# Patient Record
Sex: Female | Born: 1995 | Race: Black or African American | Hispanic: No | Marital: Single | Smoking: Never smoker
Health system: Southern US, Community
[De-identification: ages and names within clinical notes are randomized; demographics above are authoritative.]

## PROBLEM LIST (undated history)

## (undated) DIAGNOSIS — M329 Systemic lupus erythematosus, unspecified: Secondary | ICD-10-CM

## (undated) DIAGNOSIS — J45909 Unspecified asthma, uncomplicated: Secondary | ICD-10-CM

## (undated) HISTORY — DX: Systemic lupus erythematosus, unspecified: M32.9

## (undated) HISTORY — DX: Unspecified asthma, uncomplicated: J45.909

---

## 2015-12-10 DIAGNOSIS — J4540 Moderate persistent asthma, uncomplicated: Secondary | ICD-10-CM | POA: Insufficient documentation

## 2016-06-29 DIAGNOSIS — M329 Systemic lupus erythematosus, unspecified: Secondary | ICD-10-CM

## 2016-06-29 DIAGNOSIS — IMO0002 Reserved for concepts with insufficient information to code with codable children: Secondary | ICD-10-CM

## 2016-06-29 HISTORY — DX: Reserved for concepts with insufficient information to code with codable children: IMO0002

## 2016-06-29 HISTORY — DX: Systemic lupus erythematosus, unspecified: M32.9

## 2016-10-14 DIAGNOSIS — Z79899 Other long term (current) drug therapy: Secondary | ICD-10-CM | POA: Insufficient documentation

## 2016-10-14 DIAGNOSIS — R768 Other specified abnormal immunological findings in serum: Secondary | ICD-10-CM | POA: Insufficient documentation

## 2016-10-14 DIAGNOSIS — L659 Nonscarring hair loss, unspecified: Secondary | ICD-10-CM | POA: Insufficient documentation

## 2016-10-14 DIAGNOSIS — D72819 Decreased white blood cell count, unspecified: Secondary | ICD-10-CM | POA: Insufficient documentation

## 2016-10-14 DIAGNOSIS — Z8709 Personal history of other diseases of the respiratory system: Secondary | ICD-10-CM | POA: Insufficient documentation

## 2016-10-14 NOTE — Progress Notes (Signed)
Office Visit Note  Patient: Brittany Preston             Date of Birth: Aug 03, 1995           MRN: [id number]             PCP: Verlon Au, MD Referring: Verlon Au, MD Visit Date: 10/16/2016 Occupation: @GUAROCC @    Subjective:  New Patient (Initial Visit) (? LUPUS)   History of Present Illness: Brittany Preston is a 21 y.o. female seen in consultation per request of her PCP. She's accompanied by her parents in the office today. The history was given by patient and her mother. According to them patient moved to [place] in January 2018 for education. While she was there she started experiencing fatigue, hair loss, skin discoloration and rash across her nasal bridge. She also complained of having dry skin and dark spots on her bilateral hands more prominent on her right hand. She started having some weight loss and decrease in appetite. She came back to [facility] in April 2018 to her family and they noticed that she was having a lot of hair loss. She also had dark spots behind her years, neck and on her scalp. She had redness across her face. She was initially seen by her PCP and was given vitamin D for vitamin D deficiency and was referred to dermatologist Dr. Glennon Hamilton in [facility] who did some lab work and her labs were positive for ANA. She was diagnosed with disc and lupus and started her on Plaquenil. Patient did not have a skin biopsy. She took Plaquenil for 3 weeks but discontinued due to side effects which she describes as decreased appetite increased generalized pain mood changes. She was also given topical steroids which she used on her face, hands and her scalp. The rash gradually improved. After stopping the Plaquenil her appetite improved and her generalized pain got better. She also developed some oral ulcers file she was on Plaquenil but that resolved after stopping the medication. She continues to have some discomfort which she describes in the costochondral area in the  upper and lower back. She denies any history of oral ulcers nasal ulcers , Raynaud's phenomenon, photosensitivity, joint swelling.  Activities of Daily Living:  Patient reports morning stiffness for 30 minutes.   Patient Denies nocturnal pain.  Difficulty dressing/grooming: Denies Difficulty climbing stairs: Denies Difficulty getting out of chair: Denies Difficulty using hands for taps, buttons, cutlery, and/or writing: Denies   Review of Systems  Constitutional: Positive for fatigue and weight loss. Negative for night sweats, weight gain and weakness.  HENT: Positive for mouth dryness. Negative for mouth sores, trouble swallowing, trouble swallowing and nose dryness.   Eyes: Negative for pain, redness, visual disturbance and dryness.  Respiratory: Negative for cough, shortness of breath and difficulty breathing.   Cardiovascular: Negative for chest pain, palpitations, hypertension, irregular heartbeat and swelling in legs/feet.  Gastrointestinal: Negative for blood in stool, constipation and diarrhea.  Endocrine: Negative for increased urination.  Genitourinary: Negative for vaginal dryness.  Musculoskeletal: Positive for myalgias, morning stiffness and myalgias. Negative for arthralgias, joint pain, joint swelling, muscle weakness and muscle tenderness.  Skin: Positive for rash and hair loss. Negative for color change, skin tightness, ulcers and sensitivity to sunlight.  Allergic/Immunologic: Negative for susceptible to infections.  Neurological: Negative for dizziness, memory loss and night sweats.  Hematological: Negative for swollen glands.  Psychiatric/Behavioral: Positive for sleep disturbance. Negative for depressed mood. The patient is nervous/anxious.  PMFS History:  Patient Active Problem List   Diagnosis Date Noted  . ANA positive 10/14/2016  . Hair loss 10/14/2016  . Leucopenia 10/14/2016  . History of asthma 10/14/2016  . High risk medication use 10/14/2016      Past Medical History:  Diagnosis Date  . Asthma     History reviewed. No pertinent family history. History reviewed. No pertinent surgical history. Social History   Social History Narrative  . No narrative on file     Objective: Vital Signs: BP 137/80 (BP Location: Left Arm, Patient Position: Sitting, Cuff Size: Normal)   Pulse (!) 110   Resp 12   Ht 5\' 4"  (1.626 m)   Wt 133 lb (60.3 kg)   LMP 09/10/2016   BMI 22.83 kg/m    Physical Exam  Constitutional: She is oriented to person, place, and time. She appears well-developed and well-nourished.  HENT:  Head: Normocephalic and atraumatic.  Eyes: Conjunctivae and EOM are normal.  Neck: Normal range of motion.  Cardiovascular: Normal rate, regular rhythm, normal heart sounds and intact distal pulses.   Pulmonary/Chest: Effort normal and breath sounds normal.  Abdominal: Soft. Bowel sounds are normal.  Lymphadenopathy:    She has no cervical adenopathy.  Neurological: She is alert and oriented to person, place, and time.  Skin: Skin is warm and dry. Capillary refill takes 2 to 3 seconds.  Psychiatric: She has a normal mood and affect. Her behavior is normal.  Nursing note and vitals reviewed.    Musculoskeletal Exam: C-spine and thoracic lumbar spine full range of motion. She has no tenderness over costochondral region. She was able to reach her toes due to tight hamstrings. Shoulder joints elbow joints wrist joint MCPs PIPs DIPs with good range of motion with no synovitis. Hip joints knee joints ankles MTPs PIPs DIPs with good range of motion with no synovitis.  CDAI Exam: No CDAI exam completed.    Investigation: Findings:  08/04/2016 CBC shows WBC 3.2 otherwise normal, TSH normal, Sed Rate 33, ANA positive speckled pattern 1:640, Iron normal, Ferritin elevated 164, Vitamin D low 9.2, CMP normal  08/08/2016 G6PD normal, CMP normal  Dermatologist Dr Lina Sayrearlin Bullard Hollar  has evaluated patient, and has started her on  Plaquenil on 07/2016, has asked us to take this over when patient is evaluated.     Imaging: No results found.  Speciality Comments: No specialty comments available.    Procedures:  No procedures performed Allergies: Patient has no allergy information on record.   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 Detailed counseling was provided. Different treatment options and their side effects were discussed. I think she should do better on Plaquenil which will be a safe medication with the side effects. I would like to give another trial of Plaquenil if she does not tolerate it then we can switch her to something else like methotrexate or Benlysta. I will obtain following labs today.  Rash: Diagnosis skin lupus by dermatologist. I would like to get records from dermatologist .  Use of sunscreen was discussed. Need for regular exercise was also discussed. She's not using any contraception right now and she is not sexually active. I did discuss with her in future if she has to use contraception is has to be low estrogen.  High risk medication use - Plaquenil started by Dermatology  which patient took for 3 weeks and discontinued as she felt it is causing side effects. Patient states that she already had baseline eye exam. They will check on her pneumococcal vaccine status.  Hair loss: She has generalized thinning of hair which is improving.  Other decreased white blood cell (WBC) count: Repeat labs today.  History of asthma  Vitamin D deficiency: Patient has severe vitamin D deficiency she's been taking only 50,000 units once a week. We will check labs today and then it just vitamin D accordingly ideally she should have vitamin D between 50 and 60.  Family history of lupus erythematosus - Paternal great aunt    Orders: Orders  Placed This Encounter  Procedures  . CBC with Differential/Platelet  . COMPLETE METABOLIC PANEL WITH GFR  . Urinalysis, Routine w reflex microscopic  . CK  . Antinuclear Antib (ANA)  . [id number] ENA PANEL  . C3 and C4  . Cardiolipin antibodies, IgG, IgM, IgA  . Lupus anticoagulant panel  . Beta-2 glycoprotein antibodies  . Quantiferon tb gold assay (blood)  . Hepatitis panel, acute  . VITAMIN D 25 Hydroxy (Vit-D Deficiency, Fractures)  . CBC with Differential/Platelet  . COMPLETE METABOLIC PANEL WITH GFR   Meds ordered this encounter  Medications  . hydroxychloroquine (PLAQUENIL) 200 MG tablet    Sig: Take 1 tablet (200 mg total) by mouth 2 (two) times daily. Monday through Friday    Dispense:  40 tablet    Refill:  2    Face-to-face time spent with patient was 60 minutes. 50% of time was spent in counseling and coordination of care.  Follow-Up Instructions: Return for Systemic lupus.   Pollyann Savoy, MD  Note - This record has been created using Animal nutritionist.  Chart creation errors have been sought, but may not always  have been located. Such creation errors do not reflect on  the standard of medical care.

## 2016-10-16 ENCOUNTER — Encounter: Payer: Self-pay | Admitting: Rheumatology

## 2016-10-16 ENCOUNTER — Ambulatory Visit (INDEPENDENT_AMBULATORY_CARE_PROVIDER_SITE_OTHER): Payer: Self-pay | Admitting: Rheumatology

## 2016-10-16 VITALS — BP 137/80 | HR 110 | Resp 12 | Ht 64.0 in | Wt 133.0 lb

## 2016-10-16 DIAGNOSIS — M3219 Other organ or system involvement in systemic lupus erythematosus: Secondary | ICD-10-CM

## 2016-10-16 DIAGNOSIS — Z79899 Other long term (current) drug therapy: Secondary | ICD-10-CM

## 2016-10-16 DIAGNOSIS — E559 Vitamin D deficiency, unspecified: Secondary | ICD-10-CM | POA: Diagnosis not present

## 2016-10-16 DIAGNOSIS — Z1159 Encounter for screening for other viral diseases: Secondary | ICD-10-CM | POA: Diagnosis not present

## 2016-10-16 DIAGNOSIS — Z840 Family history of diseases of the skin and subcutaneous tissue: Secondary | ICD-10-CM

## 2016-10-16 DIAGNOSIS — D72818 Other decreased white blood cell count: Secondary | ICD-10-CM | POA: Diagnosis not present

## 2016-10-16 DIAGNOSIS — Z8709 Personal history of other diseases of the respiratory system: Secondary | ICD-10-CM

## 2016-10-16 DIAGNOSIS — Z1321 Encounter for screening for nutritional disorder: Secondary | ICD-10-CM | POA: Diagnosis not present

## 2016-10-16 DIAGNOSIS — L659 Nonscarring hair loss, unspecified: Secondary | ICD-10-CM

## 2016-10-16 DIAGNOSIS — Z111 Encounter for screening for respiratory tuberculosis: Secondary | ICD-10-CM

## 2016-10-16 DIAGNOSIS — R768 Other specified abnormal immunological findings in serum: Secondary | ICD-10-CM | POA: Diagnosis not present

## 2016-10-16 LAB — CBC WITH DIFFERENTIAL/PLATELET
BASOS ABS: 0 {cells}/uL (ref 0–200)
Basophils Relative: 0 %
EOS PCT: 0 %
Eosinophils Absolute: 0 cells/uL — ABNORMAL LOW (ref 15–500)
HCT: 37.5 % (ref 35.0–45.0)
HEMOGLOBIN: 12.4 g/dL (ref 11.7–15.5)
LYMPHS ABS: 750 {cells}/uL — AB (ref 850–3900)
LYMPHS PCT: 25 %
MCH: 29.1 pg (ref 27.0–33.0)
MCHC: 33.1 g/dL (ref 32.0–36.0)
MCV: 88 fL (ref 80.0–100.0)
MONOS PCT: 8 %
MPV: 10.2 fL (ref 7.5–12.5)
Monocytes Absolute: 240 cells/uL (ref 200–950)
NEUTROS PCT: 67 %
Neutro Abs: 2010 cells/uL (ref 1500–7800)
Platelets: 245 10*3/uL (ref 140–400)
RBC: 4.26 MIL/uL (ref 3.80–5.10)
RDW: 14.3 % (ref 11.0–15.0)
WBC: 3 10*3/uL — AB (ref 3.8–10.8)

## 2016-10-16 MED ORDER — HYDROXYCHLOROQUINE SULFATE 200 MG PO TABS: 200.0000 mg | ORAL_TABLET | Freq: Two times a day (BID) | ORAL | 2 refills | Status: DC

## 2016-10-16 NOTE — Progress Notes (Signed)
Pharmacy Note  Subjective: Patient presents today to the [facility] to see Dr. Corliss Skainseveshwar.  Patient seen by the pharmacist for counseling on hydroxychloroquine.    Objective: CBC, CMP ordered today  Assessment/Plan: Patient was prescribed hydroxychloroquine 200 mg daily.  Patient was counseled on the purpose, proper use, and adverse effects of hydroxychloroquine including nausea/diarrhea, skin rash, headaches, and sun sensitivity.  Discussed importance of annual eye exams while on hydroxychloroquine to monitor to ocular toxicity and discussed importance of frequent laboratory monitoring.  Provided patient with eye exam form for baseline ophthalmologic exam and standing lab instructions.  Provided patient with educational materials on hydroxychloroquine and answered all questions.  Patient consented to hydroxychloroquine.  Will upload consent in the media tab.    Lilla Shookachel Chelesea Weiand, Pharm.D., BCPS Clinical Pharmacist Pager: [phone] Phone: [phone] 10/16/2016 9:47 AM

## 2016-10-16 NOTE — Patient Instructions (Addendum)
We recommend a pneumonia vaccine if you have not already had one.  Please discuss this with your primary care provider.    Start taking hydroxychloroquine 200 mg by mouth daily.  If you tolerate that for a week, you can increase the dose to 200 mg by mouth twice daily Monday through Friday.  Standing Labs We placed an order today for your standing lab work.    Please come back and get your standing labs in 1 month, then in 3 months  We have open lab Monday through Friday from 8:30-11:30 AM and 1:30-4 PM at the office of Dr. Pollyann Savoy.   The office is located at [address], [address], [place], [place] [address] No appointment is necessary.   Labs are drawn by [facility].  You may receive a bill from [facility] for your lab work. If you have any questions regarding directions or hours of operation,  please call [phone].     Hydroxychloroquine tablets What is this medicine? HYDROXYCHLOROQUINE (hye drox ee KLOR oh kwin) is used to treat rheumatoid arthritis and systemic lupus erythematosus. It is also used to treat malaria. This medicine may be used for other purposes; ask your health care provider or pharmacist if you have questions. COMMON BRAND NAME(S): Plaquenil, Quineprox What should I tell my health care provider before I take this medicine? They need to know if you have any of these conditions: -diabetes -eye disease, vision problems -G6PD deficiency -history of blood diseases -history of irregular heartbeat -if you often drink alcohol -kidney disease -liver disease -porphyria -psoriasis -seizures -an unusual or allergic reaction to chloroquine, hydroxychloroquine, other medicines, foods, dyes, or preservatives -pregnant or trying to get pregnant -breast-feeding How should I use this medicine? Take this medicine by mouth with a glass of water. Follow the directions on the prescription label. Avoid taking antacids within 4 hours of taking this medicine. It is best to  separate these medicines by at least 4 hours. Do not cut, crush or chew this medicine. You can take it with or without food. If it upsets your stomach, take it with food. Take your medicine at regular intervals. Do not take your medicine more often than directed. Take all of your medicine as directed even if you think you are better. Do not skip doses or stop your medicine early. Talk to your pediatrician regarding the use of this medicine in children. While this drug may be prescribed for selected conditions, precautions do apply. Overdosage: If you think you have taken too much of this medicine contact a poison control center or emergency room at once. NOTE: This medicine is only for you. Do not share this medicine with others. What if I miss a dose? If you miss a dose, take it as soon as you can. If it is almost time for your next dose, take only that dose. Do not take double or extra doses. What may interact with this medicine? Do not take this medicine with any of the following medications: -cisapride -dofetilide -dronedarone -live virus vaccines -penicillamine -pimozide -thioridazine -ziprasidone This medicine may also interact with the following medications: -ampicillin -antacids -cimetidine -cyclosporine -digoxin -medicines for diabetes, like insulin, glipizide, glyburide -medicines for seizures like carbamazepine, phenobarbital, phenytoin -mefloquine -methotrexate -other medicines that prolong the QT interval (cause an abnormal heart rhythm) -praziquantel This list may not describe all possible interactions. Give your health care provider a list of all the medicines, herbs, non-prescription drugs, or dietary supplements you use. Also tell them if you smoke,  drink alcohol, or use illegal drugs. Some items may interact with your medicine. What should I watch for while using this medicine? Tell your doctor or healthcare professional if your symptoms do not start to get better or  if they get worse. Avoid taking antacids within 4 hours of taking this medicine. It is best to separate these medicines by at least 4 hours. Tell your doctor or health care professional right away if you have any change in your eyesight. Your vision and blood may be tested before and during use of this medicine. This medicine can make you more sensitive to the sun. Keep out of the sun. If you cannot avoid being in the sun, wear protective clothing and use sunscreen. Do not use sun lamps or tanning beds/booths. What side effects may I notice from receiving this medicine? Side effects that you should report to your doctor or health care professional as soon as possible: -allergic reactions like skin rash, itching or hives, swelling of the face, lips, or tongue -changes in vision -decreased hearing or ringing of the ears -redness, blistering, peeling or loosening of the skin, including inside the mouth -seizures -sensitivity to light -signs and symptoms of a dangerous change in heartbeat or heart rhythm like chest pain; dizziness; fast or irregular heartbeat; palpitations; feeling faint or lightheaded, falls; breathing problems -signs and symptoms of liver injury like dark yellow or brown urine; general ill feeling or flu-like symptoms; light-colored stools; loss of appetite; nausea; right upper belly pain; unusually weak or tired; yellowing of the eyes or skin -signs and symptoms of low blood sugar such as feeling anxious; confusion; dizziness; increased hunger; unusually weak or tired; sweating; shakiness; cold; irritable; headache; blurred vision; fast heartbeat; loss of consciousness -uncontrollable head, mouth, neck, arm, or leg movements Side effects that usually do not require medical attention (report to your doctor or health care professional if they continue or are bothersome): -anxious -diarrhea -dizziness -hair loss -headache -irritable -loss of appetite -nausea, vomiting -stomach  pain This list may not describe all possible side effects. Call your doctor for medical advice about side effects. You may report side effects to FDA at 1-800-FDA-1088. Where should I keep my medicine? Keep out of the reach of children. In children, this medicine can cause overdose with small doses. Store at room temperature between 15 and 30 degrees C (59 and 86 degrees F). Protect from moisture and light. Throw away any unused medicine after the expiration date. NOTE: This sheet is a summary. It may not cover all possible information. If you have questions about this medicine, talk to your doctor, pharmacist, or health care provider.  2018 Elsevier/Gold Standard (2015-10-31 14:16:15)   [facility] A Division of [facility] [address], [place], [place] [address] Telephone: [phone]  Fax: [phone]  Our mutual patient _____________________ (DOB: _____ / _____ / _____) is in need of a BASELINE hydroxychloroquine (Plaquenil) toxicity eye exam or FOLLOW UP Plaquenil eye exam.   Please fill out the form below and fax to our office when the Plaquenil eye exam is completed.   Thank you for your assistance in this matter.   Sincerely,   Pollyann Savoy, MD         Date of Plaquenil Toxicity Eye Exam: _____ / _____ / _____  Plaquenil Toxicity Eye Exam Was:   Normal   ABNORMAL  Plaquenil Should Be:     Continued  DISCONTINUED  Date of Follow-up Plaquenil Eye Exam:  6 mo.          12 mo.     Other: ___ / ___ / ___    Practice Name:       Name of Eye Doctor:      ___________________________   ___________________________     Practice Phone Number:     Signature of Eye Doctor:      ___________________________              ___________________________ Systemic Lupus Erythematosus, Adult Systemic lupus erythematosus is a long-term (chronic) disease that can affect many parts of the body. It can damage the skin, joints, blood  vessels, brain, kidneys, lungs, heart, and other internal organs. It causes pain, irritation, and inflammation. Systemic lupus erythematosus is an autoimmune disease. With this type of disease, the body's defense system (immune system) mistakenly attacks normal tissues instead of attacking germs or abnormal growths. What are the causes? The cause of this condition is not known. What increases the risk? This condition is more likely to develop in:  Females.  People of Asian descent.  People of African-American descent.  People who have a family history of the condition.  What are the signs or symptoms? General symptoms include:  Joint pain and swelling (common).  Fever.  Fatigue.  Unusual weight loss or weight gain.  Skin rashes, especially over the nose and cheeks (butterfly rash) and after sun exposure.  Sores inside the mouth or nose.  Other symptoms depend on which parts of the body are affected. They can include:  Shortness of breath.  Chest pain.  Frequent urination.  Blood in the urine.  Seizures.  Mental changes.  Hair loss.  Swollen and tender lymph nodes.  Swelling of the hands or feet.  Symptoms can come and go. A period of time when symptoms get worse or come back is called a flare. A period of time with no symptoms is called a remission. How is this diagnosed? This condition is diagnosed based on symptoms, a medical history, and a physical exam. You may also have tests, including:  Blood tests.  Urine tests.  A chest X-ray.  A skin or kidney biopsy. For this test, a sample of tissue is taken from the skin or kidney and studied under a microscope.  You may be referred to an autoimmune disease specialist (rheumatologist). How is this treated? There is no cure for this condition, but treatment can keep the disease in remission, help to control symptoms, and prevent damage to the heart, lungs, kidneys, and other organs. Treatment may involve  taking a combination of medicines over time. Follow these instructions at home: Medicines  Take medicines only as directed by your health care provider.  Do not take any medicines that contain estrogen without first checking with your health care provider. Estrogen can trigger flares and may increase your risk for blood clots. Lifestyle  Eat a heart-healthy diet.  Stay active as directed by your health care provider.  Do not smoke. If you need help quitting, ask your health care provider.  Protect your skin from the sun by applying sunblock and wearing protective hats and clothing.  Learn as much as you can about your condition and have a good support system in place. Support may come from family, friends, or a lupus support group. General instructions  Keep all follow-up visits as directed by your health care provider. This is important.  Work closely with all of your health care providers to manage your condition.  Let your health care provider know right away if you become pregnant or if you plan to become pregnant. Pregnancy in women with this condition is considered high risk. Contact a health care provider if:  You have a fever.  Your symptoms flare.  You develop new symptoms.  You develop swollen feet or hands.  You develop puffiness around your eyes.  Your medicines are not working.  You have bloody, foamy, or coffee-colored urine.  There are changes in your urination. For example, you urinate more often at night.  You think that you may be depressed or have anxiety. Get help right away if:  You have chest pain.  You have trouble breathing.  You have a seizure.  You suddenly get a very bad headache.  You suddenly develop facial or body weakness.  You cannot speak.  You cannot understand speech. This information is not intended to replace advice given to you by your health care provider. Make sure you discuss any questions you have with your health  care provider. Document Released: 03/07/2002 Document Revised: 11/11/2015 Document Reviewed: 02/22/2014 Elsevier Interactive Patient Education  [employer]2018 Elsevier Inc.

## 2016-10-17 ENCOUNTER — Telehealth (INDEPENDENT_AMBULATORY_CARE_PROVIDER_SITE_OTHER): Payer: Self-pay | Admitting: Rheumatology

## 2016-10-17 LAB — URINALYSIS, ROUTINE W REFLEX MICROSCOPIC
Bilirubin Urine: NEGATIVE
GLUCOSE, UA: NEGATIVE
Hgb urine dipstick: NEGATIVE
Ketones, ur: NEGATIVE
Nitrite: NEGATIVE
PROTEIN: NEGATIVE
SPECIFIC GRAVITY, URINE: 1.018 (ref 1.001–1.035)
pH: 7 (ref 5.0–8.0)

## 2016-10-17 LAB — CP5000020 ENA PANEL
DS DNA AB: 7 [IU]/mL — AB
ENA SM AB SER-ACNC: POSITIVE — AB
Ribonucleic Protein(ENA) Antibody, IgG: 7.3 — ABNORMAL HIGH
SSA (Ro) (ENA) Antibody, IgG: >8 — ABNORMAL HIGH
SSB (LA) (ENA) ANTIBODY, IGG: NEGATIVE
Scleroderma (Scl-70) (ENA) Antibody, IgG: <1

## 2016-10-17 LAB — ANTI-NUCLEAR AB-TITER (ANA TITER)

## 2016-10-17 LAB — COMPLETE METABOLIC PANEL WITH GFR
ALBUMIN: 4.2 g/dL (ref 3.6–5.1)
ALK PHOS: 33 U/L (ref 33–115)
ALT: 17 U/L (ref 6–29)
AST: 20 U/L (ref 10–30)
BUN: 8 mg/dL (ref 7–25)
CHLORIDE: 104 mmol/L (ref 98–110)
CO2: 16 mmol/L — ABNORMAL LOW (ref 20–31)
Calcium: 9.5 mg/dL (ref 8.6–10.2)
Creat: 0.78 mg/dL (ref 0.50–1.10)
GFR, Est African American: >89 mL/min (ref >=60)
GLUCOSE: 75 mg/dL (ref 65–99)
POTASSIUM: 4.2 mmol/L (ref 3.5–5.3)
SODIUM: 136 mmol/L (ref 135–146)
Total Bilirubin: 0.4 mg/dL (ref 0.2–1.2)
Total Protein: 9.1 g/dL — ABNORMAL HIGH (ref 6.1–8.1)

## 2016-10-17 LAB — CARDIOLIPIN ANTIBODIES, IGG, IGM, IGA
Anticardiolipin IgG: <14 [GPL'U]
Anticardiolipin IgM: <12 [MPL'U]

## 2016-10-17 LAB — URINALYSIS, MICROSCOPIC ONLY
Bacteria, UA: NONE SEEN [HPF]
Casts: NONE SEEN [LPF]
Crystals: NONE SEEN [HPF]
YEAST: NONE SEEN [HPF]

## 2016-10-17 LAB — VITAMIN D 25 HYDROXY (VIT D DEFICIENCY, FRACTURES): Vit D, 25-Hydroxy: 59 ng/mL (ref 30–100)

## 2016-10-17 LAB — ANA: Anti Nuclear Antibody(ANA): POSITIVE — AB

## 2016-10-17 LAB — C3 AND C4
C3 Complement: 67 mg/dL — ABNORMAL LOW (ref 83–193)
C4 COMPLEMENT: 15 mg/dL (ref 15–57)

## 2016-10-17 LAB — CK: CK TOTAL: 48 U/L (ref 29–143)

## 2016-10-17 NOTE — Telephone Encounter (Signed)
10/16/2016 [facility]

## 2016-10-18 LAB — HEPATITIS PANEL, ACUTE
HCV Ab: NEGATIVE
HEP B S AG: NEGATIVE
Hep A IgM: NONREACTIVE
Hep B C IgM: NONREACTIVE

## 2016-10-18 LAB — QUANTIFERON TB GOLD ASSAY (BLOOD)
INTERFERON GAMMA RELEASE ASSAY: NEGATIVE
Mitogen-Nil: 5.95 IU/mL
QUANTIFERON NIL VALUE: 0.12 [IU]/mL

## 2016-10-19 LAB — LUPUS ANTICOAGULANT PANEL

## 2016-10-19 LAB — RFX DRVVT SCR W/RFLX CONF 1:1 MIX: dRVVT Screen: 31 s (ref <=45)

## 2016-10-19 LAB — RFX PTT-LA W/RFX TO HEX PHASE CONF: PTT-LA Screen: 40 s (ref <=40)

## 2016-10-20 LAB — BETA-2 GLYCOPROTEIN ANTIBODIES
Beta-2 Glyco I IgG: <9 SGU (ref <=20)
Beta-2-Glycoprotein I IgA: <9 SAU (ref <=20)
Beta-2-Glycoprotein I IgM: 25 SMU — ABNORMAL HIGH (ref <=20)

## 2016-10-20 NOTE — Progress Notes (Signed)
Labs are c/w Lupus. If she tolerates PLQ 200 mg po qd then will increase PLQ to 200 mg po BID Monday - Friday after next labs labs

## 2016-10-28 ENCOUNTER — Telehealth: Payer: Self-pay | Admitting: *Deleted

## 2016-10-28 NOTE — Telephone Encounter (Signed)
Patient advised of lab results and verbalized understanding.  

## 2016-10-28 NOTE — Telephone Encounter (Signed)
-----   Message from Pollyann SavoyShaili Deveshwar, MD sent at 10/20/2016  8:14 PM EDT ----- Labs are c/w Lupus. If she tolerates PLQ 200 mg po qd then will increase PLQ to 200 mg po BID Monday - Friday after next labs labs

## 2016-10-28 NOTE — Telephone Encounter (Signed)
Attempted to contact the patient and left message for patient to call the office.  

## 2016-11-06 DIAGNOSIS — E559 Vitamin D deficiency, unspecified: Secondary | ICD-10-CM | POA: Insufficient documentation

## 2016-11-06 NOTE — Progress Notes (Signed)
Office Visit Note  Patient: Brittany Preston             Date of Birth: 09-27-1995           MRN: [id number]             PCP: Verlon Au, MD Referring: No ref. provider found Visit Date: 11/13/2016 Occupation: @GUAROCC @    Subjective:  Muscle pain.   History of Present Illness: Brittany Preston is a 21 y.o. female history of systemic lupus or dermatosis. She started Plaquenil about a month ago. She is feeling somewhat better since the last visit. She has noticed improvement in her fatigue, hair loss and the rash. She still have mild Raynaud's symptoms. She denies any joint pain or joint swelling. She does have some muscular pain in her thoracic and lumbar region.  Activities of Daily Living:  Patient reports morning stiffness for 10  minutes.   Patient Denies nocturnal pain.  Difficulty dressing/grooming: Denies Difficulty climbing stairs: Denies Difficulty getting out of chair: Denies Difficulty using hands for taps, buttons, cutlery, and/or writing: Denies   Review of Systems  Constitutional: Positive for fatigue.  HENT: Positive for mouth dryness. Negative for mouth sores.   Eyes: Negative for dryness.  Respiratory: Positive for cough. Negative for shortness of breath and difficulty breathing.   Cardiovascular: Negative.  Negative for palpitations, hypertension and irregular heartbeat.  Gastrointestinal: Positive for constipation. Negative for blood in stool and diarrhea.  Endocrine: Negative.   Genitourinary: Negative.  Negative for nocturia.  Musculoskeletal: Positive for arthralgias, joint pain and morning stiffness. Negative for joint swelling, myalgias, muscle weakness and myalgias.  Skin: Negative.  Negative for color change, rash, hair loss, ulcers and sensitivity to sunlight.  Neurological: Negative.  Negative for headaches.  Hematological: Negative.  Negative for swollen glands.  Psychiatric/Behavioral: Positive for sleep disturbance. Negative for depressed  mood. The patient is nervous/anxious.     PMFS History:  Patient Active Problem List   Diagnosis Date Noted  . Systemic lupus erythematosus (HCC) 11/10/2016  . Vitamin D deficiency 11/06/2016  . Hair loss 10/14/2016  . Leukopenia 10/14/2016  . History of asthma 10/14/2016  . High risk medication use 10/14/2016    Past Medical History:  Diagnosis Date  . Asthma     History reviewed. No pertinent family history. History reviewed. No pertinent surgical history. Social History   Social History Narrative  . No narrative on file     Objective: Vital Signs: BP 125/82   Pulse (!) 109   Resp 12   Ht 5\' 2"  (1.575 m)   Wt 128 lb (58.1 kg)   BMI 23.41 kg/m    Physical Exam  Constitutional: She is oriented to person, place, and time. She appears well-developed and well-nourished.  HENT:  Head: Normocephalic and atraumatic.  Eyes: Conjunctivae and EOM are normal.  Neck: Normal range of motion.  Cardiovascular: Normal rate, regular rhythm, normal heart sounds and intact distal pulses.   Pulmonary/Chest: Effort normal and breath sounds normal.  Abdominal: Soft. Bowel sounds are normal.  Lymphadenopathy:    She has no cervical adenopathy.  Neurological: She is alert and oriented to person, place, and time.  Skin: Skin is warm and dry. Capillary refill takes less than 2 seconds.  Psychiatric: She has a normal mood and affect. Her behavior is normal.  Nursing note and vitals reviewed.    Musculoskeletal Exam: C-spine and thoracic lumbar spine good range of motion. Shoulder joints elbow joints wrist  joint MCPs PIPs DIPs are good range of motion with no synovitis. Hip joints knee joints ankles MTPs PIPs DIPs are good range of motion with no synovitis.  CDAI Exam: No CDAI exam completed.    Investigation: No additional findings. Eye exam May 2018  CBC Latest Ref Rng & Units 10/16/2016  WBC 3.8 - 10.8 K/uL 3.0(L)  Hemoglobin 11.7 - 15.5 g/dL 16.1  Hematocrit 09.6 - 45.0 % 37.5   Platelets 140 - 400 K/uL 245   CMP Latest Ref Rng & Units 10/16/2016  Glucose 65 - 99 mg/dL 75  BUN 7 - 25 mg/dL 8  Creatinine 0.45 - 4.09 mg/dL 8.11  Sodium 914 - 782 mmol/L 136  Potassium 3.5 - 5.3 mmol/L 4.2  Chloride 98 - 110 mmol/L 104  CO2 20 - 31 mmol/L 16(L)  Calcium 8.6 - 10.2 mg/dL 9.5  Total Protein 6.1 - 8.1 g/dL 9.1(H)  Total Bilirubin 0.2 - 1.2 mg/dL 0.4  Alkaline Phos 33 - 115 U/L 33  AST 10 - 30 U/L 20  ALT 6 - 29 U/L 17  UA neg,CK48, ANA 1:1280NS, C3 67(low) C4 normal,DS DNA +,RNP+,Sm+,Ro+,aCLneg,b2GP1 IgM 25, LA neg, Hep panel neg, TB gold neg, Vit d 59 Imaging: No results found.  Speciality Comments: No specialty comments available.    Procedures:  No procedures performed Allergies: Patient has no known allergies.   Assessment / Plan:     Visit Diagnoses: Other systemic lupus erythematosus with other organ involvement (HCC) - History of fatigue, weight loss, hair loss, malar rash, Raynauds,skin lupus, neutropenia, elevated sedimentation rate, positive ANA,ds DNA, Sm, Ro, low C3.Patient is clinically doing much better on Plaquenil. Her fatigue and hair loss is improved. She does not have any malar rash today. She states she has some hyperpigmented lesions which are getting better.. Detailed discussion regarding her lab review. She has multiple antibodies which are positive significance of each antibody was discussed. She has positive Ro antibody association of Ro antibody with arrhythmias and fetal abnormalities was also discussed. She's been advised to get a baseline EKG with her PCP.  High risk medication use - Plaquenil 200mg  po bid M-F - Plan: CBC with Differential/Platelet, COMPLETE METABOLIC PANEL WITH GFR today and then every 3 months. I will check her dsDNA complements next visit.  Other decreased white blood cell (WBC) count: We will continue to monitor.  Hair loss: Improving. I did discuss option of using  topical Rogaine.  Rash : Improving. Need for  using sunscreen on a regular basis was emphasized.  Vitamin D deficiency: Her vitamin D should be between 50-60 which isn't desirable range right now. I've advised her to take vitamin D 2000 units every day after she finished the course of vitamin D.  History of asthma  High risk medication use - Plaquenil started by Dermatology  - Plan: CBC with Differential/Platelet, COMPLETE METABOLIC PANEL WITH GFR   We also discussed the use of contraception if needed should be low estrogen. Orders: No orders of the defined types were placed in this encounter.  Meds ordered this encounter  Medications  . hydroxychloroquine (PLAQUENIL) 200 MG tablet    Sig: Take 1 tablet (200 mg total) by mouth 2 (two) times daily. Monday through Friday    Dispense:  120 tablet    Refill:  0    Face-to-face time spent with patient was 45 minutes. Greater than 50% of time was spent in counseling and coordination of care.  Follow-Up Instructions: Return in about 3 months (  around 02/13/2017) for Systemic lupus.   Pollyann SavoyShaili Prophet Renwick, MD  Note - This record has been created using Animal [employer]agon software.  Chart creation errors have been sought, but may not always  have been located. Such creation errors do not reflect on  the standard of medical care.

## 2016-11-10 DIAGNOSIS — M329 Systemic lupus erythematosus, unspecified: Secondary | ICD-10-CM | POA: Insufficient documentation

## 2016-11-13 ENCOUNTER — Encounter (INDEPENDENT_AMBULATORY_CARE_PROVIDER_SITE_OTHER): Payer: Self-pay

## 2016-11-13 ENCOUNTER — Other Ambulatory Visit: Payer: Self-pay | Admitting: Radiology

## 2016-11-13 ENCOUNTER — Ambulatory Visit (INDEPENDENT_AMBULATORY_CARE_PROVIDER_SITE_OTHER): Payer: 59 | Admitting: Rheumatology

## 2016-11-13 ENCOUNTER — Encounter: Payer: Self-pay | Admitting: Rheumatology

## 2016-11-13 VITALS — BP 125/82 | HR 109 | Resp 12 | Ht 62.0 in | Wt 128.0 lb

## 2016-11-13 DIAGNOSIS — L659 Nonscarring hair loss, unspecified: Secondary | ICD-10-CM

## 2016-11-13 DIAGNOSIS — D72818 Other decreased white blood cell count: Secondary | ICD-10-CM | POA: Diagnosis not present

## 2016-11-13 DIAGNOSIS — Z8709 Personal history of other diseases of the respiratory system: Secondary | ICD-10-CM | POA: Diagnosis not present

## 2016-11-13 DIAGNOSIS — R21 Rash and other nonspecific skin eruption: Secondary | ICD-10-CM

## 2016-11-13 DIAGNOSIS — E559 Vitamin D deficiency, unspecified: Secondary | ICD-10-CM

## 2016-11-13 DIAGNOSIS — Z79899 Other long term (current) drug therapy: Secondary | ICD-10-CM | POA: Diagnosis not present

## 2016-11-13 DIAGNOSIS — M3219 Other organ or system involvement in systemic lupus erythematosus: Secondary | ICD-10-CM | POA: Diagnosis not present

## 2016-11-13 LAB — CBC WITH DIFFERENTIAL/PLATELET
Basophils Absolute: 0 cells/uL (ref 0–200)
Basophils Relative: 0 %
EOS PCT: 0 %
Eosinophils Absolute: 0 cells/uL — ABNORMAL LOW (ref 15–500)
HCT: 35.8 % (ref 35.0–45.0)
HEMOGLOBIN: 11.9 g/dL (ref 11.7–15.5)
Lymphocytes Relative: 31 %
Lymphs Abs: 961 cells/uL (ref 850–3900)
MCH: 29.1 pg (ref 27.0–33.0)
MCHC: 33.2 g/dL (ref 32.0–36.0)
MCV: 87.5 fL (ref 80.0–100.0)
MONO ABS: 341 {cells}/uL (ref 200–950)
MONOS PCT: 11 %
MPV: 10.1 fL (ref 7.5–12.5)
NEUTROS PCT: 58 %
Neutro Abs: 1798 cells/uL (ref 1500–7800)
PLATELETS: 259 10*3/uL (ref 140–400)
RBC: 4.09 MIL/uL (ref 3.80–5.10)
RDW: 13.8 % (ref 11.0–15.0)
WBC: 3.1 10*3/uL — AB (ref 3.8–10.8)

## 2016-11-13 MED ORDER — HYDROXYCHLOROQUINE SULFATE 200 MG PO TABS: 200.0000 mg | ORAL_TABLET | Freq: Two times a day (BID) | ORAL | 0 refills | Status: DC

## 2016-11-13 NOTE — Progress Notes (Signed)
Rheumatology Medication Review by a Pharmacist Does the patient feel that his/her medications are working for him/her?  Patient started hydroxychloroquine one month ago.  She reports some improvement in fatigue.   Has the patient been experiencing any side effects to the medications prescribed?  No Does the patient have any problems obtaining medications?  No , however, patient reports future refills will need to come from mail order pharmacy.   Issues to address at subsequent visits: None   Pharmacist comments:  Brittany Preston is a pleasant 21 yo F who presents for follow up of systemic lupus erythematosus.  She is currently taking hydroxychloroquine 200 mg BID Monday through Friday.  Patient started hydroxychloroquine approximately one month ago.  She is due for standing labs today then in 3 months.  Patient confirms she had her baseline hydroxychloroquine eye exam in May.  She will have results sent to our office.  Patient denies any questions or concerns regarding her medications at this time.   Brittany Preston, Pharm.D., BCPS, CPP Clinical Pharmacist Pager: [phone] Phone: [phone] 11/13/2016 9:50 AM

## 2016-11-13 NOTE — Patient Instructions (Addendum)
Standing Labs We placed an order today for your standing lab work.    Please come back and get your standing labs in November then every 3 months.  We have open lab Monday through Friday from 8:30-11:30 AM and 1:30-4 PM at the office of Dr. Pollyann SavoyShaili Deveshwar.   The office is located at [address], [address], [place], [place] [address] No appointment is necessary.   Labs are drawn by [facility].  You may receive a bill from [facility] for your lab work. If you have any questions regarding directions or hours of operation,  please call [phone].    Advised baseline EKG with your PCP.

## 2016-11-14 LAB — COMPLETE METABOLIC PANEL WITH GFR
ALBUMIN: 4.3 g/dL (ref 3.6–5.1)
ALT: 18 U/L (ref 6–29)
AST: 19 U/L (ref 10–30)
Alkaline Phosphatase: 34 U/L (ref 33–115)
BUN: 9 mg/dL (ref 7–25)
CHLORIDE: 106 mmol/L (ref 98–110)
CO2: 21 mmol/L (ref 20–32)
Calcium: 9.5 mg/dL (ref 8.6–10.2)
Creat: 0.77 mg/dL (ref 0.50–1.10)
GFR, Est African American: >89 mL/min (ref >=60)
GFR, Est Non African American: >89 mL/min (ref >=60)
GLUCOSE: 86 mg/dL (ref 65–99)
POTASSIUM: 4.3 mmol/L (ref 3.5–5.3)
SODIUM: 137 mmol/L (ref 135–146)
Total Bilirubin: 0.3 mg/dL (ref 0.2–1.2)
Total Protein: 8.7 g/dL — ABNORMAL HIGH (ref 6.1–8.1)

## 2016-11-14 NOTE — Progress Notes (Signed)
Labs are stable.

## 2017-01-01 ENCOUNTER — Other Ambulatory Visit: Payer: Self-pay | Admitting: Rheumatology

## 2017-01-01 NOTE — Telephone Encounter (Signed)
Last Visit: 11/13/16  Next Visit: 03/30/17 Labs: 11/13/16 stable PLQ Eye Exam: no baseline plq eye exam on file.  Attempted to contact patient and unable to leave a message. Mailbox is full.

## 2017-01-05 NOTE — Telephone Encounter (Signed)
Okay to refill this last time without PLQ eye exam per Dr. Corliss Skains

## 2017-02-13 ENCOUNTER — Other Ambulatory Visit: Payer: Self-pay

## 2017-02-13 ENCOUNTER — Encounter (INDEPENDENT_AMBULATORY_CARE_PROVIDER_SITE_OTHER): Payer: Self-pay

## 2017-02-13 DIAGNOSIS — Z79899 Other long term (current) drug therapy: Secondary | ICD-10-CM

## 2017-02-13 LAB — CBC WITH DIFFERENTIAL/PLATELET
BASOS PCT: 0 %
Basophils Absolute: 0 cells/uL (ref 0–200)
EOS ABS: 0 {cells}/uL — AB (ref 15–500)
Eosinophils Relative: 0 %
HCT: 35.1 % (ref 35.0–45.0)
HEMOGLOBIN: 11.7 g/dL (ref 11.7–15.5)
Lymphs Abs: 788 cells/uL — ABNORMAL LOW (ref 850–3900)
MCH: 29 pg (ref 27.0–33.0)
MCHC: 33.3 g/dL (ref 32.0–36.0)
MCV: 86.9 fL (ref 80.0–100.0)
MPV: 10.5 fL (ref 7.5–12.5)
Monocytes Relative: 13.5 %
Neutro Abs: 1115 cells/uL — ABNORMAL LOW (ref 1500–7800)
Neutrophils Relative %: 50.7 %
Platelets: 232 10*3/uL (ref 140–400)
RBC: 4.04 10*6/uL (ref 3.80–5.10)
RDW: 12.8 % (ref 11.0–15.0)
Total Lymphocyte: 35.8 %
WBC: 2.2 10*3/uL — ABNORMAL LOW (ref 3.8–10.8)
WBCMIX: 297 {cells}/uL (ref 200–950)

## 2017-02-13 LAB — COMPLETE METABOLIC PANEL WITH GFR
AG Ratio: 1.1 (calc) (ref 1.0–2.5)
ALT: 17 U/L (ref 6–29)
AST: 19 U/L (ref 10–30)
Albumin: 4.3 g/dL (ref 3.6–5.1)
Alkaline phosphatase (APISO): 30 U/L — ABNORMAL LOW (ref 33–115)
BILIRUBIN TOTAL: 0.5 mg/dL (ref 0.2–1.2)
BUN: 10 mg/dL (ref 7–25)
CHLORIDE: 104 mmol/L (ref 98–110)
CO2: 27 mmol/L (ref 20–32)
Calcium: 9.4 mg/dL (ref 8.6–10.2)
Creat: 0.78 mg/dL (ref 0.50–1.10)
GFR, Est African American: 126 mL/min/{1.73_m2} (ref >=60)
GFR, Est Non African American: 109 mL/min/{1.73_m2} (ref >=60)
GLUCOSE: 72 mg/dL (ref 65–99)
Globulin: 3.9 g/dL (calc) — ABNORMAL HIGH (ref 1.9–3.7)
Potassium: 4.1 mmol/L (ref 3.5–5.3)
Sodium: 138 mmol/L (ref 135–146)
TOTAL PROTEIN: 8.2 g/dL — AB (ref 6.1–8.1)

## 2017-03-17 NOTE — Progress Notes (Signed)
Office Visit Note  Patient: Brittany Preston             Date of Birth: [birth year]           MRN: [id number]             PCP: Verlon AuBoyd, Tammy Lamonica, MD Referring: Verlon AuBoyd, Tammy Lamonica, MD Visit Date: 03/30/2017 Occupation: @GUAROCC @    Subjective:  Recent lupus flare  History of Present Illness: Brittany Preston is a 21 y.o. female with history of systemic lupus erythematosus.  Patient states she continues to take PLQ 200 mg BID M-F.  She states her eye exam was on Aug 15, 2016.  Patient states starting the week of December 10 th she began to have a lupus flare.  She states she had body aches, fatigue, decreased appetite, weight loss, eye lid puffiness, weakness, dark spots on eyelids/neck/underneath breasts, and blisters on her bilateral hands.  She denies the blisters being painful. She has not seen a dermatologist recently. She has been using an ointment on them.  She denies weight loss.  She denies joint swelling.   She has been having pain in her lower back, pain between shoulder blades, and bilateral hands.  She states she is starting to feel better overall since her flare started after final exams.  Patient reports she has been having palpitations and racing of her heart since this flare began.   Activities of Daily Living:  Patient reports morning stiffness for 10 minutes.   Patient Reports nocturnal pain.  Difficulty dressing/grooming: Denies Difficulty climbing stairs: Reports Difficulty getting out of chair: Reports Difficulty using hands for taps, buttons, cutlery, and/or writing: Reports   Review of Systems  Constitutional: Positive for fatigue and weakness.  HENT: Negative for mouth sores, mouth dryness and nose dryness.   Eyes: Negative for redness and dryness.  Respiratory: Negative for cough, hemoptysis, shortness of breath and difficulty breathing.   Cardiovascular: Positive for palpitations. Negative for chest pain, hypertension, irregular heartbeat and swelling in  legs/feet.  Gastrointestinal: Negative for blood in stool, constipation and diarrhea.  Endocrine: Negative for increased urination.  Genitourinary: Negative for painful urination.  Musculoskeletal: Positive for arthralgias, joint pain, morning stiffness and muscle tenderness. Negative for joint swelling, myalgias, muscle weakness and myalgias.  Skin: Positive for rash. Negative for color change, pallor, hair loss, nodules/bumps, redness, skin tightness, ulcers and sensitivity to sunlight.  Neurological: Negative for dizziness, numbness and headaches.  Hematological: Negative for swollen glands.  Psychiatric/Behavioral: Positive for depressed mood and sleep disturbance. The patient is not nervous/anxious.     PMFS History:  Patient Active Problem List   Diagnosis Date Noted  . Systemic lupus erythematosus (HCC) 11/10/2016  . Vitamin D deficiency 11/06/2016  . Hair loss 10/14/2016  . Leukopenia 10/14/2016  . History of asthma 10/14/2016  . High risk medication use 10/14/2016    Past Medical History:  Diagnosis Date  . Asthma     History reviewed. No pertinent family history. History reviewed. No pertinent surgical history. Social History   Social History Narrative  . Not on file     Objective: Vital Signs: BP 106/74 (BP Location: Left Arm, Patient Position: Sitting, Cuff Size: Normal)   Pulse (!) 117   Resp 16   Ht 5\' 2"  (1.575 m)   Wt 127 lb (57.6 kg)   BMI 23.23 kg/m    Physical Exam  Constitutional: She is oriented to person, place, and time. She appears well-developed and well-nourished.  HENT:  Head: Normocephalic and atraumatic.  One oral ulcer noted  Eyes: Conjunctivae and EOM are normal.  Periorbital edema   Neck: Normal range of motion.  Cardiovascular: Normal rate, regular rhythm, normal heart sounds and intact distal pulses.  Pulmonary/Chest: Effort normal and breath sounds normal.  Abdominal: Soft. Bowel sounds are normal.  Musculoskeletal: She exhibits  no edema.  Lymphadenopathy:    She has cervical adenopathy.  Neurological: She is alert and oriented to person, place, and time.  Skin: Skin is warm and dry. Capillary refill takes less than 2 seconds.  Dark discoloration on eyelids, neck, and underneath bilateral breasts Ulcerations on bilateral hands with peeling and dark discoloration around nailbeds   Psychiatric: She has a normal mood and affect. Her behavior is normal.  Nursing note and vitals reviewed.    Musculoskeletal Exam: C-spine, thoracic, and lumbar spine good ROM.  Shoulder joints, elbow joints, and wrist joints good ROM.  MCPs, PIPs, and DIPs good ROM with no synovitis.  Complete fist formation.  Edema of bilateral hands  Hip joints, knee joints, and ankle joints good ROM with no synovitis.  No crepitus, warmth or effusion of knees.  MTPs, PIPs, and DIPs good ROM no synovitis.      CDAI Exam: No CDAI exam completed.    Investigation: No additional findings. CBC Latest Ref Rng & Units 02/13/2017 11/13/2016 10/16/2016  WBC 3.8 - 10.8 Thousand/uL 2.2(L) 3.1(L) 3.0(L)  Hemoglobin 11.7 - 15.5 g/dL 16.111.7 09.611.9 04.512.4  Hematocrit 35.0 - 45.0 % 35.1 35.8 37.5  Platelets 140 - 400 Thousand/uL 232 259 245   CMP Latest Ref Rng & Units 02/13/2017 11/13/2016 10/16/2016  Glucose 65 - 99 mg/dL 72 86 75  BUN 7 - 25 mg/dL 10 9 8   Creatinine 0.50 - 1.10 mg/dL 4.090.78 8.110.77 9.140.78  Sodium 135 - 146 mmol/L 138 137 136  Potassium 3.5 - 5.3 mmol/L 4.1 4.3 4.2  Chloride 98 - 110 mmol/L 104 106 104  CO2 20 - 32 mmol/L 27 21 16(L)  Calcium 8.6 - 10.2 mg/dL 9.4 9.5 9.5  Total Protein 6.1 - 8.1 g/dL 8.2(H) 8.7(H) 9.1(H)  Total Bilirubin 0.2 - 1.2 mg/dL 0.5 0.3 0.4  Alkaline Phos 33 - 115 U/L - 34 33  AST 10 - 30 U/L 19 19 20   ALT 6 - 29 U/L 17 18 17     Imaging: No results found.  Speciality Comments: No specialty comments available.    Procedures:  No procedures performed Allergies: Patient has no known allergies.   Assessment / Plan:       Visit Diagnoses: Other systemic lupus erythematosus with other organ involvement (HCC) - History of fatigue, weight loss, hair loss, malar rash, Raynauds,skin lupus, neutropenia, elevated sedimentation rate, positive ANA,ds DNA, Sm, Ro, low C3 -Patient reports a recent lupus flare that began March 09, 2017.  She experienced fatigue, weakness, decreased appetite, Raynaud's with ulcerations on bilateral hands, arthralgia, and palpitations.  Routine lupus labs were drawn today.  She will continue on Plaquenil 200 mg po BID M-F.  She was given a refill of PLQ today. TPMT was also drawn.  We will initiate patient on Imuran PO 50 mg daily for 2 weeks and we will check labs in 2 weeks.  After two weeks we will increase her Imuran dose to 100 mg po every day and repeat labs in 2 weeks.  She was advised to start taking Aspirin 81 mg daily.  She was also given a prescription for a prednisone 60 mg  taper.  She was counseled on side effects of Imuran and Prednisone.  All questions were addressed.  The Prednisone tapering schedule was also explained to the patient.  A handout with information about Prednisone and Imuran was provided to the patient.  She will follow up in 1 week.   Plan: Anti-DNA antibody, double-stranded, Beta-2 glycoprotein antibodies, VITAMIN D 25 Hydroxy (Vit-D Deficiency, Fractures), C3 and C4, Urinalysis, Routine w reflex microscopic, COMPLETE METABOLIC PANEL WITH GFR, CBC with Differential/Platelet, Pan-ANCA. Patient will start Imuran if her TPM T results are normal.  High risk medication use - Plaquenil 200mg  po bid M-F. Eye exam May 2018.  They are going to have ophthalmologist fax the results.  She was given a handout for her next PLQ eye exam. TPMT was drawn today.  She was given a prescription for Imuran 50 mg po everyday.  She will return in 2 weeks to check labs.- Plan: COMPLETE METABOLIC PANEL WITH GFR, CBC with Differential/Platelet, Thiopurine methyltransferase(tpmt)rbc  Other decreased  white blood cell (WBC) count - A CBC was drawn today.  We will continue to monitor.    Hair loss - Denies any recent hair loss.    Rash - Improving. Need for using sunscreen on a regular basis was emphasized.    History of vitamin D deficiency: Vitamin D level was drawn today.  She was advised to continue taking Vitamin D supplements daily.    Palpitations: She reports that with this recent flare she has noticed an increase in heart rate and palpitations.  In the office, she had a HR of 101.  Patient denies ever having a baseline EKG or seeing a cardiologist in the past.  She will see Dr. Jacinto Halim today.    Raynaud's disease without gangrene: She has ulcerations on all digits.  She has perionychial dark discoloration.  She was instructed to wear gloves and keep her core body temperature warm. She was instructed to keep her hands clean. She was also advised to take Aspirin 81 mg daily.    History of asthma: No shortness of breath or wheezing.   Orders: Orders Placed This Encounter  Procedures  . Anti-DNA antibody, double-stranded  . Beta-2 glycoprotein antibodies  . VITAMIN D 25 Hydroxy (Vit-D Deficiency, Fractures)  . C3 and C4  . Urinalysis, Routine w reflex microscopic  . COMPLETE METABOLIC PANEL WITH GFR  . CBC with Differential/Platelet  . Thiopurine methyltransferase(tpmt)rbc  . Pan-ANCA   Meds ordered this encounter  Medications  . azaTHIOprine (IMURAN) 50 MG tablet    Sig: Take 1 tab po daily x 2 wks, then labs, then 2 tabs po daily    Dispense:  60 tablet    Refill:  2  . predniSONE (DELTASONE) 10 MG tablet    Sig: 6 tabs po qd x 1 wk, 5 tabs po qd x 1 wk, 4 tabs po qd x 1 wk, 3.5 tabs po qd x 1 wk, 3 tabs po qd x 1 wk, 2.5 tabs po qd x 1 wk, 2 tabs po qd x 2 wks, 1.5 tabs po qd x 2 wks, then 1 tab po daily    Dispense:  250 tablet    Refill:  0    Face-to-face time spent with patient was 30 minutes. Greater than 50% of time was spent in counseling and coordination of  care.  Follow-Up Instructions: Return in about 1 week (around 04/06/2017) for Systemic lupus erythematosus. Pollyann Savoy, MD  Note - This record has been created using Dragon  software.  Chart creation errors have been sought, but may not always  have been located. Such creation errors do not reflect on  the standard of medical care.

## 2017-03-29 ENCOUNTER — Other Ambulatory Visit: Payer: Self-pay | Admitting: Rheumatology

## 2017-03-30 ENCOUNTER — Ambulatory Visit: Payer: 59 | Admitting: Rheumatology

## 2017-03-30 ENCOUNTER — Encounter: Payer: Self-pay | Admitting: Rheumatology

## 2017-03-30 VITALS — BP 106/74 | HR 117 | Resp 16 | Ht 62.0 in | Wt 127.0 lb

## 2017-03-30 DIAGNOSIS — Z79899 Other long term (current) drug therapy: Secondary | ICD-10-CM | POA: Diagnosis not present

## 2017-03-30 DIAGNOSIS — Z8709 Personal history of other diseases of the respiratory system: Secondary | ICD-10-CM | POA: Diagnosis not present

## 2017-03-30 DIAGNOSIS — Z8639 Personal history of other endocrine, nutritional and metabolic disease: Secondary | ICD-10-CM

## 2017-03-30 DIAGNOSIS — R002 Palpitations: Secondary | ICD-10-CM | POA: Diagnosis not present

## 2017-03-30 DIAGNOSIS — M3219 Other organ or system involvement in systemic lupus erythematosus: Secondary | ICD-10-CM

## 2017-03-30 DIAGNOSIS — I7300 Raynaud's syndrome without gangrene: Secondary | ICD-10-CM | POA: Diagnosis not present

## 2017-03-30 DIAGNOSIS — R21 Rash and other nonspecific skin eruption: Secondary | ICD-10-CM | POA: Diagnosis not present

## 2017-03-30 DIAGNOSIS — D72818 Other decreased white blood cell count: Secondary | ICD-10-CM

## 2017-03-30 DIAGNOSIS — L659 Nonscarring hair loss, unspecified: Secondary | ICD-10-CM

## 2017-03-30 MED ORDER — PREDNISONE 10 MG PO TABS: ORAL_TABLET | ORAL | 0 refills | Status: DC

## 2017-03-30 MED ORDER — AZATHIOPRINE 50 MG PO TABS: ORAL_TABLET | ORAL | 2 refills | Status: DC

## 2017-03-30 NOTE — Patient Instructions (Signed)
Prednisone tablets What is this medicine? PREDNISONE (PRED ni sone) is a corticosteroid. It is commonly used to treat inflammation of the skin, joints, lungs, and other organs. Common conditions treated include asthma, allergies, and arthritis. It is also used for other conditions, such as blood disorders and diseases of the adrenal glands. This medicine may be used for other purposes; ask your health care provider or pharmacist if you have questions. COMMON BRAND NAME(S): Deltasone, Predone, Sterapred, Sterapred DS What should I tell my health care provider before I take this medicine? They need to know if you have any of these conditions: -Cushing's syndrome -diabetes -glaucoma -heart disease -high blood pressure -infection (especially a virus infection such as chickenpox, cold sores, or herpes) -kidney disease -liver disease -mental illness -myasthenia gravis -osteoporosis -seizures -stomach or intestine problems -thyroid disease -an unusual or allergic reaction to lactose, prednisone, other medicines, foods, dyes, or preservatives -pregnant or trying to get pregnant -breast-feeding How should I use this medicine? Take this medicine by mouth with a glass of water. Follow the directions on the prescription label. Take this medicine with food. If you are taking this medicine once a day, take it in the morning. Do not take more medicine than you are told to take. Do not suddenly stop taking your medicine because you may develop a severe reaction. Your doctor will tell you how much medicine to take. If your doctor wants you to stop the medicine, the dose may be slowly lowered over time to avoid any side effects. Talk to your pediatrician regarding the use of this medicine in children. Special care may be needed. Overdosage: If you think you have taken too much of this medicine contact a poison control center or emergency room at once. NOTE: This medicine is only for you. Do not share  this medicine with others. What if I miss a dose? If you miss a dose, take it as soon as you can. If it is almost time for your next dose, talk to your doctor or health care professional. You may need to miss a dose or take an extra dose. Do not take double or extra doses without advice. What may interact with this medicine? Do not take this medicine with any of the following medications: -metyrapone -mifepristone This medicine may also interact with the following medications: -aminoglutethimide -amphotericin B -aspirin and aspirin-like medicines -barbiturates -certain medicines for diabetes, like glipizide or glyburide -cholestyramine -cholinesterase inhibitors -cyclosporine -digoxin -diuretics -ephedrine -female hormones, like estrogens and birth control pills -isoniazid -ketoconazole -NSAIDS, medicines for pain and inflammation, like ibuprofen or naproxen -phenytoin -rifampin -toxoids -vaccines -warfarin This list may not describe all possible interactions. Give your health care provider a list of all the medicines, herbs, non-prescription drugs, or dietary supplements you use. Also tell them if you smoke, drink alcohol, or use illegal drugs. Some items may interact with your medicine. What should I watch for while using this medicine? Visit your doctor or health care professional for regular checks on your progress. If you are taking this medicine over a prolonged period, carry an identification card with your name and address, the type and dose of your medicine, and your doctor's name and address. This medicine may increase your risk of getting an infection. Tell your doctor or health care professional if you are around anyone with measles or chickenpox, or if you develop sores or blisters that do not heal properly. If you are going to have surgery, tell your doctor or health care professional  that you have taken this medicine within the last twelve months. Ask your doctor or  health care professional about your diet. You may need to lower the amount of salt you eat. This medicine may affect blood sugar levels. If you have diabetes, check with your doctor or health care professional before you change your diet or the dose of your diabetic medicine. What side effects may I notice from receiving this medicine? Side effects that you should report to your doctor or health care professional as soon as possible: -allergic reactions like skin rash, itching or hives, swelling of the face, lips, or tongue -changes in emotions or moods -changes in vision -depressed mood -eye pain -fever or chills, cough, sore throat, pain or difficulty passing urine -increased thirst -swelling of ankles, feet Side effects that usually do not require medical attention (report to your doctor or health care professional if they continue or are bothersome): -confusion, excitement, restlessness -headache -nausea, vomiting -skin problems, acne, thin and shiny skin -trouble sleeping -weight gain This list may not describe all possible side effects. Call your doctor for medical advice about side effects. You may report side effects to FDA at 1-800-FDA-1088. Where should I keep my medicine? Keep out of the reach of children. Store at room temperature between 15 and 30 degrees C (59 and 86 degrees F). Protect from light. Keep container tightly closed. Throw away any unused medicine after the expiration date. NOTE: This sheet is a summary. It may not cover all possible information. If you have questions about this medicine, talk to your doctor, pharmacist, or health care provider.  2018 Elsevier/Gold Standard (2010-10-31 10:57:14) Azathioprine tablets What is this medicine? AZATHIOPRINE (ay za THYE oh preen) suppresses the immune system. It is used to prevent organ rejection after a transplant. It is also used to treat rheumatoid arthritis. This medicine may be used for other purposes; ask your  health care provider or pharmacist if you have questions. COMMON BRAND NAME(S): Azasan, Imuran What should I tell my health care provider before I take this medicine? They need to know if you have any of these conditions: -infection -kidney disease -liver disease -an unusual or allergic reaction to azathioprine, other medicines, lactose, foods, dyes, or preservatives -pregnant or trying to get pregnant -breast feeding How should I use this medicine? Take this medicine by mouth with a full glass of water. Follow the directions on the prescription label. Take your medicine at regular intervals. Do not take your medicine more often than directed. Continue to take your medicine even if you feel better. Do not stop taking except on your doctor's advice. Talk to your pediatrician regarding the use of this medicine in children. Special care may be needed. Overdosage: If you think you have taken too much of this medicine contact a poison control center or emergency room at once. NOTE: This medicine is only for you. Do not share this medicine with others. What if I miss a dose? If you miss a dose, take it as soon as you can. If it is almost time for your next dose, take only that dose. Do not take double or extra doses. What may interact with this medicine? Do not take this medicine with any of the following medications: -febuxostat -mercaptopurine This medicine may also interact with the following medications: -allopurinol -aminosalicylates like sulfasalazine, mesalamine, balsalazide, and olsalazine -leflunomide -medicines called ACE inhibitors like benazepril, captopril, enalapril, fosinopril, quinapril, lisinopril, ramipril, and trandolapril -mycophenolate -sulfamethoxazole; trimethoprim -vaccines -warfarin This list may not  describe all possible interactions. Give your health care provider a list of all the medicines, herbs, non-prescription drugs, or dietary supplements you use. Also tell  them if you smoke, drink alcohol, or use illegal drugs. Some items may interact with your medicine. What should I watch for while using this medicine? Visit your doctor or health care professional for regular checks on your progress. You will need frequent blood checks during the first few months you are receiving the medicine. If you get a cold or other infection while receiving this medicine, call your doctor or health care professional. Do not treat yourself. The medicine may increase your risk of getting an infection. Women should inform their doctor if they wish to become pregnant or think they might be pregnant. There is a potential for serious side effects to an unborn child. Talk to your health care professional or pharmacist for more information. Men may have a reduced sperm count while they are taking this medicine. Talk to your health care professional for more information. This medicine may increase your risk of getting certain kinds of cancer. Talk to your doctor about healthy lifestyle choices, important screenings, and your risk. What side effects may I notice from receiving this medicine? Side effects that you should report to your doctor or health care professional as soon as possible: -allergic reactions like skin rash, itching or hives, swelling of the face, lips, or tongue -changes in vision -confusion -fever, chills, or any other sign of infection -loss of balance or coordination -severe stomach pain -unusual bleeding, bruising -unusually weak or tired -vomiting -yellowing of the eyes or skin Side effects that usually do not require medical attention (report to your doctor or health care professional if they continue or are bothersome): -hair loss -nausea This list may not describe all possible side effects. Call your doctor for medical advice about side effects. You may report side effects to FDA at 1-800-FDA-1088. Where should I keep my medicine? Keep out of the reach  of children. Store at room temperature between 15 and 25 degrees C (59 and 77 degrees F). Protect from light. Throw away any unused medicine after the expiration date. NOTE: This sheet is a summary. It may not cover all possible information. If you have questions about this medicine, talk to your doctor, pharmacist, or health care provider.  2018 Elsevier/Gold Standard (2013-07-12 12:00:31)

## 2017-03-30 NOTE — Progress Notes (Signed)
Office Visit Note  Patient: Brittany Preston             Date of Birth: 1995-09-28           MRN: [id number]             PCP: Verlon AuBoyd, Tammy Lamonica, MD Referring: Verlon AuBoyd, Tammy Lamonica, MD Visit Date: 04/06/2017 Occupation: @GUAROCC @    Subjective:  Right knee stiffness and swelling   History of Present Illness: Brittany Preston is a 21 y.o. female with history of systemic lupus erythematosus.  Patient states she is feeling better since her visit last week.  She is on Prednisone 60 mg daily and is tapering to 50 mg this week.  She states she has been tolerating the Prednisone well.  She states her fatigue and body aches have improved. She has had more energy and the Prednisone has caused an increased appetite.  She denies any weight loss.  She denies a malar rash.  She denies any recent fevers or swollen lymph nodes.  She denies any oral or nasal ulcers. She states her blisters on her hands have been healing.  She received a cream from her dermatologist, which has been helping.  She is unsure what the cream is.  She saw Dr. Jacinto HalimGanji on 03/30/17 and his EKG was normal.  She is going back on Wednesday to have an Echo performed.  She states her palpitations have resolved.  She reports right knee and right ankle stiffness and swelling.  She states she only has pain with kneeling.  She continues to take Plaquenil 200 mg BID M-F.  Activities of Daily Living:  Patient reports morning stiffness for 5 minutes.   Patient Denies nocturnal pain.  Difficulty dressing/grooming: Denies Difficulty climbing stairs: Denies Difficulty getting out of chair: Denies Difficulty using hands for taps, buttons, cutlery, and/or writing: Denies   Review of Systems  Constitutional: Positive for fatigue. Negative for weakness.  HENT: Negative for mouth sores, mouth dryness and nose dryness.   Eyes: Negative for redness and dryness.  Respiratory: Negative for cough, hemoptysis, shortness of breath and difficulty breathing.     Cardiovascular: Negative for chest pain, palpitations, hypertension, irregular heartbeat and swelling in legs/feet.  Gastrointestinal: Negative for blood in stool, constipation and diarrhea.  Endocrine: Negative for increased urination.  Genitourinary: Negative for painful urination.  Musculoskeletal: Positive for arthralgias, joint pain and morning stiffness. Negative for joint swelling, myalgias, muscle weakness, muscle tenderness and myalgias.  Skin: Positive for color change (Raynaud's) and ulcers. Negative for rash, hair loss, nodules/bumps, redness, skin tightness and sensitivity to sunlight.  Allergic/Immunologic: Negative for susceptible to infections.  Neurological: Negative for dizziness, numbness and headaches.  Hematological: Negative for swollen glands.  Psychiatric/Behavioral: Positive for sleep disturbance. Negative for depressed mood. The patient is not nervous/anxious.     PMFS History:  Patient Active Problem List   Diagnosis Date Noted  . Systemic lupus erythematosus (HCC) 11/10/2016  . Vitamin D deficiency 11/06/2016  . Hair loss 10/14/2016  . Leukopenia 10/14/2016  . History of asthma 10/14/2016  . High risk medication use 10/14/2016    Past Medical History:  Diagnosis Date  . Asthma     History reviewed. No pertinent family history. History reviewed. No pertinent surgical history. Social History   Social History Narrative  . Not on file     Objective: Vital Signs: BP 114/76 (BP Location: Left Arm, Patient Position: Sitting, Cuff Size: Normal)   Pulse 74   Resp 15  Ht 5\' 2"  (1.575 m)   Wt 133 lb (60.3 kg)   BMI 24.33 kg/m    Physical Exam  Constitutional: She is oriented to person, place, and time. She appears well-developed and well-nourished.  HENT:  Head: Normocephalic and atraumatic.  Oral ulcer healed.  Eyes: Conjunctivae and EOM are normal.  Neck: Normal range of motion.  Cardiovascular: Normal rate, regular rhythm, normal heart sounds  and intact distal pulses.  Pulmonary/Chest: Effort normal and breath sounds normal.  Abdominal: Soft. Bowel sounds are normal.  Musculoskeletal: She exhibits no edema.  Lymphadenopathy:    She has no cervical adenopathy.  Neurological: She is alert and oriented to person, place, and time.  Skin: Skin is warm and dry. Capillary refill takes less than 2 seconds.  Healing ulcerations on bilateral hands.   Psychiatric: She has a normal mood and affect. Her behavior is normal.  Nursing note and vitals reviewed.    Musculoskeletal Exam: C-spine, thoracic, and lumbar spine good ROM.  Shoulder joints. Elbow joints, wrist joints good ROM.  MTPs, PIPs, and DIPs good ROM with no synovitis.  Healing ulcerations on hands.  Hip joints, knee joints, ankle joints, MTPs, PIPs, and DIPs good ROM with no synovitis.  Bilateral  knee effusion, right more than left. Bilateral knees are slightly erythematous but no warmth.     CDAI Exam: No CDAI exam completed.    Investigation: No additional findings.TB Gold: 10/16/2016 Negative  CBC Latest Ref Rng & Units 03/30/2017 02/13/2017 11/13/2016  WBC 3.8 - 10.8 Thousand/uL 2.4(L) 2.2(L) 3.1(L)  Hemoglobin 11.7 - 15.5 g/dL 11.1(L) 11.7 11.9  Hematocrit 35.0 - 45.0 % 33.3(L) 35.1 35.8  Platelets 140 - 400 Thousand/uL 206 232 259   CMP Latest Ref Rng & Units 03/30/2017 02/13/2017 11/13/2016  Glucose 65 - 99 mg/dL 75 72 86  BUN 7 - 25 mg/dL 17 10 9   Creatinine 0.50 - 1.10 mg/dL 4.09 8.11 9.14  Sodium 135 - 146 mmol/L 137 138 137  Potassium 3.5 - 5.3 mmol/L 4.3 4.1 4.3  Chloride 98 - 110 mmol/L 106 104 106  CO2 20 - 32 mmol/L 26 27 21   Calcium 8.6 - 10.2 mg/dL 8.2(L) 9.4 9.5  Total Protein 6.1 - 8.1 g/dL 7.4 7.8(G) 9.5(A)  Total Bilirubin 0.2 - 1.2 mg/dL 0.3 0.5 0.3  Alkaline Phos 33 - 115 U/L - - 34  AST 10 - 30 U/L 33(H) 19 19  ALT 6 - 29 U/L 26 17 18     Imaging: No results found.  Speciality Comments: No specialty comments available.    Procedures:    No procedures performed Allergies: Patient has no known allergies.    Assessment / Plan:     Visit Diagnoses: Other systemic lupus erythematosus with other organ involvement (HCC) - History of fatigue, weight loss, hair loss, malar rash, Raynauds,skin lupus, neutropenia, elevated sedimentation rate, positive ANA,ds DNA, Sm, Ro, low C3 - She continues to have fatigue but her overall fatigue and body aches have improved.  Her hand ulcerations are healing well.  She has no new rashes, fevers, or swollen lymph nodes.  Her periorbital edema has improved significantly.  She has no oral or nasal ulcers.  She has bilateral knee effusions, worse in the right knee.  She declined a knee aspiration today. She continues to take Plaquenil 200 mg BID po M-F. She will continue on Prednisone 50 mg by mouth everyday this week and taper to 40 mg po everyday next week.  She will follow up  in 2 weeks.  The tapering plan was discussed with her and she understands. Her UA was repeated today. BMP and CBC were drawn today. Different treatment options and their side effects were discussed. 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 Indications side effects contraindications of methotrexate were discussed. She is willing to proceed with methotrexate. As she was neutropenic I will start her on a lower dose and his increase it gradually. She will be started on Methotrexate 4 tablets weekly. We will repeat CBC and CMP in 2 weeks and increase MTX to 6 tablets weekly and then recheck labs in 2 weeks.  She will also take folic acid 2 mg daily.  She will continue to take Aspirin 81 mg po daily. A prescription for MTX and folic acid was sent to the pharmacy.  We discussed the imprortance of good nutrition, rest, and good sleep hygiene.  Plan: Urinalysis, Routine w reflex microscopic  Effusion bilateral knee joints: Patient declined aspiration.  High risk medication use - Plaquenil 200mg  po bid M-F.PLQ eye exam normal  08/15/16. Prednisone 60 mg taper. She is unable to be initiated on Imuran due to TMPT deficiency.  She will be started on Methotrexate 4 tablets po once weekly.  Folic acid 2 mg daily. BMP and CBC were drawn today.  Her CBC and CMP will be rechecked in 2 weeks after starting MTX.  Discussed the importance of getting a CXR and following up with Dr. Leda QuailSuzanne Miller to initiate an oral contraceptive medication.  A chest x-ray was ordered.  A referral was placed to Dr. Leda QuailSuzanne Miller.    Encounter for counseling regarding contraception: We discussed the importance of oral contraception options.  A referral was made to Dr. Leda QuailSuzanne Miller.  She will need to be started on a low estrogen birth control.  She states she is currently not sexually active at this time.    Drug Counseling TB Gold: Negative 10/16/16 Hepatitis panel: Negative 10/16/16  Chest-xray:  Ordered   Contraception: Not sexually active.  A referral was made to Dr. Leda QuailSuzanne Miller to discuss oral contraceptive options.    Alcohol use: Discussed.  She will avoid alcohol.    Patient was counseled on the purpose, proper use, and adverse effects of methotrexate including nausea, infection, and signs and symptoms of pneumonitis.  Reviewed instructions with patient to take methotrexate weekly along with folic acid daily.  Discussed the importance of frequent monitoring of kidney and liver function and blood counts, and provided patient with standing lab instructions.  Counseled patient to avoid NSAIDs and alcohol while on methotrexate.  Provided patient with educational materials on methotrexate and answered all questions.  Advised patient to get annual influenza vaccine and to get a pneumococcal vaccine if patient has not already had one.  Patient voiced understanding.  Patient consented to methotrexate use.  Will upload into chart.    Raynaud's disease without gangrene - Healing ucerations on all digits.  She has perionychial dark discoloration.   She  received a cream from her dermatologist, which has helped in the healing process.  She will continue on Aspirin 81 mg po daily.   Other decreased white blood cell (WBC) count - labs on 03/30/17 revealed WBC of 2.4, which is likely due to her lupus.  We will continue to monitor.  CBC was drawn today and will be rechecked in 2 weeks after starting MTX.     Rash - Improving. Need for using sunscreen on a  regular basis was emphasized.  She recently saw her dermatologist.     Hair loss -  Denies any recent hair loss.    History of vitamin D deficiency - She was advised to continue taking Vitamin D supplements daily.  Her vitamin D was WNL at 75.  We will continue to monitor.  Ideally, her vitamin D level should be between 50-60.      Palpitations - She reports that her appointment with Dr. Jacinto Halim on 03/30/2017 went well.  Her EKG was normal.  She is having an Echo performed on Wednesday.    History of asthma: No shortness of breath or wheezing.      Orders: Orders Placed This Encounter  Procedures  . DG Chest 2 View  . Urinalysis, Routine w reflex microscopic  . BASIC METABOLIC PANEL WITH GFR  . CBC with Differential/Platelet  . CK  . Ambulatory referral to Gynecology   Meds ordered this encounter  Medications  . methotrexate (RHEUMATREX) 2.5 MG tablet    Sig: Caution:Chemotherapy. Protect from light. Take 4 tablets once weekly for 2 weeks then increase to 6 tablets once weekly for two weeks.    Dispense:  20 tablet    Refill:  0  . folic acid (FOLVITE) 1 MG tablet    Sig: Take 2 tablets (2 mg total) by mouth daily.    Dispense:  180 tablet    Refill:  3    Face-to-face time spent with patient was 30 minutes. Greater than 50% of time was spent in counseling and coordination of care.  Follow-Up Instructions: Return in about 2 weeks (around 04/20/2017) for Systemic lupus erythematosus.   Pollyann Savoy, MD  Note - This record has been created using Animal nutritionist.  Chart  creation errors have been sought, but may not always  have been located. Such creation errors do not reflect on  the standard of medical care.

## 2017-03-30 NOTE — Telephone Encounter (Signed)
Last Visit: 11/13/16  Next Visit: 03/30/17 Labs: 11/13/16 stable PLQ Eye Exam: Patient states she had that done in May 2018. WNL  Will have a copy sent from eye doctor  Okay to refill per Dr. Corliss Skainseveshwar

## 2017-04-01 ENCOUNTER — Telehealth: Payer: Self-pay | Admitting: Rheumatology

## 2017-04-01 LAB — URINALYSIS, ROUTINE W REFLEX MICROSCOPIC
Bacteria, UA: NONE SEEN /HPF
Bilirubin Urine: NEGATIVE
Glucose, UA: NEGATIVE
Hyaline Cast: NONE SEEN /LPF
Ketones, ur: NEGATIVE
Nitrite: NEGATIVE
Specific Gravity, Urine: 1.02 (ref 1.001–1.03)
pH: 6.5 (ref 5.0–8.0)

## 2017-04-01 LAB — BETA-2 GLYCOPROTEIN ANTIBODIES: Beta-2 Glyco 1 IgM: <9 SMU (ref <=20)

## 2017-04-01 LAB — COMPLETE METABOLIC PANEL WITH GFR
AG Ratio: 0.7 (calc) — ABNORMAL LOW (ref 1.0–2.5)
ALBUMIN MSPROF: 3.1 g/dL — AB (ref 3.6–5.1)
ALT: 26 U/L (ref 6–29)
AST: 33 U/L — ABNORMAL HIGH (ref 10–30)
Alkaline phosphatase (APISO): 34 U/L (ref 33–115)
BUN: 17 mg/dL (ref 7–25)
CALCIUM: 8.2 mg/dL — AB (ref 8.6–10.2)
CO2: 26 mmol/L (ref 20–32)
Chloride: 106 mmol/L (ref 98–110)
Creat: 0.7 mg/dL (ref 0.50–1.10)
GFR, EST AFRICAN AMERICAN: 144 mL/min/{1.73_m2} (ref >=60)
GFR, EST NON AFRICAN AMERICAN: 124 mL/min/{1.73_m2} (ref >=60)
Globulin: 4.3 g/dL (calc) — ABNORMAL HIGH (ref 1.9–3.7)
Glucose, Bld: 75 mg/dL (ref 65–99)
Potassium: 4.3 mmol/L (ref 3.5–5.3)
Sodium: 137 mmol/L (ref 135–146)
TOTAL PROTEIN: 7.4 g/dL (ref 6.1–8.1)
Total Bilirubin: 0.3 mg/dL (ref 0.2–1.2)

## 2017-04-01 LAB — CBC WITH DIFFERENTIAL/PLATELET
BASOS ABS: 0 {cells}/uL (ref 0–200)
Basophils Relative: 0 %
EOS PCT: 0 %
Eosinophils Absolute: 0 cells/uL — ABNORMAL LOW (ref 15–500)
HEMATOCRIT: 33.3 % — AB (ref 35.0–45.0)
HEMOGLOBIN: 11.1 g/dL — AB (ref 11.7–15.5)
LYMPHS ABS: 773 {cells}/uL — AB (ref 850–3900)
MCH: 28.9 pg (ref 27.0–33.0)
MCHC: 33.3 g/dL (ref 32.0–36.0)
MCV: 86.7 fL (ref 80.0–100.0)
MPV: 10.7 fL (ref 7.5–12.5)
Monocytes Relative: 9.7 %
NEUTROS PCT: 58.1 %
Neutro Abs: 1394 cells/uL — ABNORMAL LOW (ref 1500–7800)
Platelets: 206 10*3/uL (ref 140–400)
RBC: 3.84 10*6/uL (ref 3.80–5.10)
RDW: 12.1 % (ref 11.0–15.0)
Total Lymphocyte: 32.2 %
WBC: 2.4 10*3/uL — ABNORMAL LOW (ref 3.8–10.8)
WBCMIX: 233 {cells}/uL (ref 200–950)

## 2017-04-01 LAB — C3 AND C4
C3 Complement: 40 mg/dL — ABNORMAL LOW (ref 83–193)
C4 Complement: 3 mg/dL — ABNORMAL LOW (ref 15–57)

## 2017-04-01 LAB — ANTI-DNA ANTIBODY, DOUBLE-STRANDED: ds DNA Ab: 6 [IU]/mL — ABNORMAL HIGH

## 2017-04-01 LAB — VITAMIN D 25 HYDROXY (VIT D DEFICIENCY, FRACTURES): Vit D, 25-Hydroxy: 38 ng/mL (ref 30–100)

## 2017-04-01 NOTE — Telephone Encounter (Signed)
I spoke with patient today. She is clinically doing better. She reports that joint pain and fatigue has improved. She's been taking prednisone 60 mg a day. She still has rash on her eyelids  and over her hands. She stated that she is planning to go see a dermatologist. I plan to start her on Imuran as soon as her TPM T results come back.

## 2017-04-01 NOTE — Progress Notes (Signed)
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  She has proteinuria (2+) we will repeat her UA on Monday at her follow up visit.  We will also check a BMP to monitor her blood glucose level since she is on high dose Prednisone.  A protein creatinine ratio will also be checked.   Other labs are pending

## 2017-04-04 LAB — PAN-ANCA

## 2017-04-04 LAB — THIOPURINE METHYLTRANSFERASE (TPMT), RBC: Thiopurine Methyltransferase, RBC: 9 nmol/hr/mL RBC — ABNORMAL LOW

## 2017-04-06 ENCOUNTER — Ambulatory Visit: Payer: 59 | Admitting: Rheumatology

## 2017-04-06 ENCOUNTER — Encounter: Payer: Self-pay | Admitting: Rheumatology

## 2017-04-06 VITALS — BP 114/76 | HR 74 | Resp 15 | Ht 62.0 in | Wt 133.0 lb

## 2017-04-06 DIAGNOSIS — Z79899 Other long term (current) drug therapy: Secondary | ICD-10-CM | POA: Diagnosis not present

## 2017-04-06 DIAGNOSIS — Z3009 Encounter for other general counseling and advice on contraception: Secondary | ICD-10-CM

## 2017-04-06 DIAGNOSIS — R002 Palpitations: Secondary | ICD-10-CM

## 2017-04-06 DIAGNOSIS — Z8639 Personal history of other endocrine, nutritional and metabolic disease: Secondary | ICD-10-CM | POA: Diagnosis not present

## 2017-04-06 DIAGNOSIS — Z8709 Personal history of other diseases of the respiratory system: Secondary | ICD-10-CM | POA: Diagnosis not present

## 2017-04-06 DIAGNOSIS — M3219 Other organ or system involvement in systemic lupus erythematosus: Secondary | ICD-10-CM | POA: Diagnosis not present

## 2017-04-06 DIAGNOSIS — R21 Rash and other nonspecific skin eruption: Secondary | ICD-10-CM

## 2017-04-06 DIAGNOSIS — L659 Nonscarring hair loss, unspecified: Secondary | ICD-10-CM

## 2017-04-06 DIAGNOSIS — D72818 Other decreased white blood cell count: Secondary | ICD-10-CM

## 2017-04-06 DIAGNOSIS — I7300 Raynaud's syndrome without gangrene: Secondary | ICD-10-CM

## 2017-04-06 MED ORDER — METHOTREXATE 2.5 MG PO TABS: ORAL_TABLET | ORAL | 0 refills | Status: DC

## 2017-04-06 MED ORDER — FOLIC ACID 1 MG PO TABS: 2.0000 mg | ORAL_TABLET | Freq: Every day | ORAL | 3 refills | Status: DC

## 2017-04-06 NOTE — Progress Notes (Signed)
We will discuss lab results with her at her appointment today.  We will repeat UA today.

## 2017-04-06 NOTE — Patient Instructions (Addendum)
Take 4 tablets of Methotrexate by mouth once weekly for 2 weeks.  Recheck labs in 2 weeks.  Then increase to 6 tablets once weekly for 2 weeks.   Take folic acid 2 mg by mouth daily.  Continue on Prednisone taper.  Take Prednisone 5 tablets by mouth everyday this week.  Taper to 4 tablets by mouth everyday next week.    R   Methotrexate tablets What is this medicine? METHOTREXATE (METH oh TREX ate) is a chemotherapy drug used to treat cancer including breast cancer, leukemia, and lymphoma. This medicine can also be used to treat psoriasis and certain kinds of arthritis. This medicine may be used for other purposes; ask your health care provider or pharmacist if you have questions. COMMON BRAND NAME(S): Rheumatrex, Trexall What should I tell my health care provider before I take this medicine? They need to know if you have any of these conditions: -fluid in the stomach area or lungs -if you often drink alcohol -infection or immune system problems -kidney disease or on hemodialysis -liver disease -low blood counts, like low white cell, platelet, or red cell counts -lung disease -radiation therapy -stomach ulcers -ulcerative colitis -an unusual or allergic reaction to methotrexate, other medicines, foods, dyes, or preservatives -pregnant or trying to get pregnant -breast-feeding How should I use this medicine? Take this medicine by mouth with a glass of water. Follow the directions on the prescription label. Take your medicine at regular intervals. Do not take it more often than directed. Do not stop taking except on your doctor's advice. Make sure you know why you are taking this medicine and how often you should take it. If this medicine is used for a condition that is not cancer, like arthritis or psoriasis, it should be taken weekly, NOT daily. Taking this medicine more often than directed can cause serious side effects, even death. Talk to your healthcare provider about safe handling  and disposal of this medicine. You may need to take special precautions. Talk to your pediatrician regarding the use of this medicine in children. While this drug may be prescribed for selected conditions, precautions do apply. Overdosage: If you think you have taken too much of this medicine contact a poison control center or emergency room at once. NOTE: This medicine is only for you. Do not share this medicine with others. What if I miss a dose? If you miss a dose, talk with your doctor or health care professional. Do not take double or extra doses. What may interact with this medicine? This medicine may interact with the following medication: -acitretin -aspirin and aspirin-like medicines including salicylates -azathioprine -certain antibiotics like penicillins, tetracycline, and chloramphenicol -cyclosporine -gold -hydroxychloroquine -live virus vaccines -NSAIDs, medicines for pain and inflammation, like ibuprofen or naproxen -other cytotoxic agents -penicillamine -phenylbutazone -phenytoin -probenecid -retinoids such as isotretinoin and tretinoin -steroid medicines like prednisone or cortisone -sulfonamides like sulfasalazine and trimethoprim/sulfamethoxazole -theophylline This list may not describe all possible interactions. Give your health care provider a list of all the medicines, herbs, non-prescription drugs, or dietary supplements you use. Also tell them if you smoke, drink alcohol, or use illegal drugs. Some items may interact with your medicine. What should I watch for while using this medicine? Avoid alcoholic drinks. This medicine can make you more sensitive to the sun. Keep out of the sun. If you cannot avoid being in the sun, wear protective clothing and use sunscreen. Do not use sun lamps or tanning beds/booths. You may need blood work done while  you are taking this medicine. Call your doctor or health care professional for advice if you get a fever, chills or sore  throat, or other symptoms of a cold or flu. Do not treat yourself. This drug decreases your body's ability to fight infections. Try to avoid being around people who are sick. This medicine may increase your risk to bruise or bleed. Call your doctor or health care professional if you notice any unusual bleeding. Check with your doctor or health care professional if you get an attack of severe diarrhea, nausea and vomiting, or if you sweat a lot. The loss of too much body fluid can make it dangerous for you to take this medicine. Talk to your doctor about your risk of cancer. You may be more at risk for certain types of cancers if you take this medicine. Both men and women must use effective birth control with this medicine. Do not become pregnant while taking this medicine or until at least 1 normal menstrual cycle has occurred after stopping it. Women should inform their doctor if they wish to become pregnant or think they might be pregnant. Men should not father a child while taking this medicine and for 3 months after stopping it. There is a potential for serious side effects to an unborn child. Talk to your health care professional or pharmacist for more information. Do not breast-feed an infant while taking this medicine. What side effects may I notice from receiving this medicine? Side effects that you should report to your doctor or health care professional as soon as possible: -allergic reactions like skin rash, itching or hives, swelling of the face, lips, or tongue -breathing problems or shortness of breath -diarrhea -dry, nonproductive cough -low blood counts - this medicine may decrease the number of white blood cells, red blood cells and platelets. You may be at increased risk for infections and bleeding. -mouth sores -redness, blistering, peeling or loosening of the skin, including inside the mouth -signs of infection - fever or chills, cough, sore throat, pain or trouble passing  urine -signs and symptoms of bleeding such as bloody or black, tarry stools; red or dark-brown urine; spitting up blood or brown material that looks like coffee grounds; red spots on the skin; unusual bruising or bleeding from the eye, gums, or nose -signs and symptoms of kidney injury like trouble passing urine or change in the amount of urine -signs and symptoms of liver injury like dark yellow or brown urine; general ill feeling or flu-like symptoms; light-colored stools; loss of appetite; nausea; right upper belly pain; unusually weak or tired; yellowing of the eyes or skin Side effects that usually do not require medical attention (report to your doctor or health care professional if they continue or are bothersome): -dizziness -hair loss -tiredness -upset stomach -vomiting This list may not describe all possible side effects. Call your doctor for medical advice about side effects. You may report side effects to FDA at 1-800-FDA-1088. Where should I keep my medicine? Keep out of the reach of children. Store at room temperature between 20 and 25 degrees C (68 and 77 degrees F). Protect from light. Throw away any unused medicine after the expiration date. NOTE: This sheet is a summary. It may not cover all possible information. If you have questions about this medicine, talk to your doctor, pharmacist, or health care provider.  2018 Elsevier/Gold Standard (2014-11-20 05:39:22)

## 2017-04-07 LAB — CBC WITH DIFFERENTIAL/PLATELET
BASOS ABS: 29 {cells}/uL (ref 0–200)
Basophils Relative: 0.2 %
EOS ABS: 0 {cells}/uL — AB (ref 15–500)
EOS PCT: 0 %
HCT: 34.4 % — ABNORMAL LOW (ref 35.0–45.0)
HEMOGLOBIN: 11.3 g/dL — AB (ref 11.7–15.5)
LYMPHS ABS: 1944 {cells}/uL (ref 850–3900)
MCH: 28.3 pg (ref 27.0–33.0)
MCHC: 32.8 g/dL (ref 32.0–36.0)
MCV: 86.2 fL (ref 80.0–100.0)
MPV: 10.5 fL (ref 7.5–12.5)
Monocytes Relative: 10.4 %
NEUTROS ABS: 10930 {cells}/uL — AB (ref 1500–7800)
Neutrophils Relative %: 75.9 %
Platelets: 361 10*3/uL (ref 140–400)
RBC: 3.99 10*6/uL (ref 3.80–5.10)
RDW: 12.8 % (ref 11.0–15.0)
Total Lymphocyte: 13.5 %
WBC mixed population: 1498 cells/uL — ABNORMAL HIGH (ref 200–950)
WBC: 14.4 10*3/uL — ABNORMAL HIGH (ref 3.8–10.8)

## 2017-04-07 LAB — BASIC METABOLIC PANEL WITH GFR
BUN: 17 mg/dL (ref 7–25)
CO2: 25 mmol/L (ref 20–32)
CREATININE: 0.71 mg/dL (ref 0.50–1.10)
Calcium: 9.2 mg/dL (ref 8.6–10.2)
Chloride: 107 mmol/L (ref 98–110)
GFR, EST NON AFRICAN AMERICAN: 122 mL/min/{1.73_m2} (ref >=60)
GFR, Est African American: 141 mL/min/{1.73_m2} (ref >=60)
GLUCOSE: 86 mg/dL (ref 65–99)
POTASSIUM: 3.9 mmol/L (ref 3.5–5.3)
SODIUM: 139 mmol/L (ref 135–146)

## 2017-04-07 LAB — URINALYSIS, ROUTINE W REFLEX MICROSCOPIC
BILIRUBIN URINE: NEGATIVE
Glucose, UA: NEGATIVE
HGB URINE DIPSTICK: NEGATIVE
Hyaline Cast: NONE SEEN /LPF
KETONES UR: NEGATIVE
NITRITE: NEGATIVE
Specific Gravity, Urine: 1.024 (ref 1.001–1.03)
pH: 6.5 (ref 5.0–8.0)

## 2017-04-07 LAB — CK: CK TOTAL: 33 U/L (ref 29–143)

## 2017-04-07 NOTE — Progress Notes (Signed)
We will obtain a urine protein creatinine ratio at next visit.  Her UA revealed 1+ protein.   WBCs are elevated due to high dose Prednisone usage.  We will continue to monitor her labs closely.   Please remind patient [facility].

## 2017-04-07 NOTE — Progress Notes (Signed)
Office Visit Note  Patient: Brittany Preston             Date of Birth: 06-Dec-1995           MRN: [id number]             PCP: Verlon Au, MD Referring: Verlon Au, MD Visit Date: 04/20/2017 Occupation: @GUAROCC @    Subjective:  Other (doing better, patient complains of insomnia )   History of Present Illness: Brittany Preston is a 22 y.o. female with history of systemic lupus erythematosus. She had severe flare a few weeks ago. She's been on tapering dose of prednisone and currently on 40 mg a day. She's been on 40 mg a day of prednisone for the last 1 week. She started methotrexate about 2 weeks ago along with Plaquenil. She is on 4 tablets of methotrexate per week. She states she continues to have a lot of fatigue. She's been having insomnia from prednisone and has not been sleeping much. Overall her joint symptoms have improved. Her rash is healing. She continues to have a lot of Raynaud's phenomenon. She was seen by dermatologist and has been using some topical steroid cream. She's been having pain and stiffness in her bilateral knee joints. She's experienced some nausea and fatigue with methotrexate. She's also having more anxiety dealing with the disease process and the medication schedule.  Activities of Daily Living:  Patient reports morning stiffness for 10 minutes.   Patient Denies nocturnal pain.  Difficulty dressing/grooming: Denies Difficulty climbing stairs: Denies Difficulty getting out of chair: Denies Difficulty using hands for taps, buttons, cutlery, and/or writing: Denies   Review of Systems  Constitutional: Positive for fatigue. Negative for night sweats, weight gain, weight loss and weakness.  HENT: Negative for mouth sores, trouble swallowing, trouble swallowing, mouth dryness and nose dryness.   Eyes: Negative for pain, redness, visual disturbance and dryness.  Respiratory: Negative for cough, shortness of breath and difficulty breathing.     Cardiovascular: Negative for chest pain, palpitations, hypertension, irregular heartbeat and swelling in legs/feet.  Gastrointestinal: Negative for blood in stool, constipation and diarrhea.  Endocrine: Negative for increased urination.  Genitourinary: Negative for vaginal dryness.  Musculoskeletal: Positive for arthralgias, joint pain, joint swelling and morning stiffness. Negative for myalgias, muscle weakness, muscle tenderness and myalgias.  Skin: Positive for color change and rash. Negative for hair loss, skin tightness, ulcers and sensitivity to sunlight.  Allergic/Immunologic: Negative for susceptible to infections.  Neurological: Negative for dizziness, memory loss and night sweats.  Hematological: Negative for swollen glands.  Psychiatric/Behavioral: Positive for sleep disturbance. Negative for depressed mood. The patient is nervous/anxious.     PMFS History:  Patient Active Problem List   Diagnosis Date Noted  . Systemic lupus erythematosus (HCC) 11/10/2016  . Vitamin D deficiency 11/06/2016  . Hair loss 10/14/2016  . Leukopenia 10/14/2016  . History of asthma 10/14/2016  . High risk medication use 10/14/2016    Past Medical History:  Diagnosis Date  . Asthma     History reviewed. No pertinent family history. History reviewed. No pertinent surgical history. Social History   Social History Narrative  . Not on file     Objective: Vital Signs: BP 130/90 (BP Location: Left Arm, Patient Position: Sitting, Cuff Size: Normal)   Pulse (!) 104   Resp 15   Ht 5\' 2"  (1.575 m)   Wt 126 lb (57.2 kg)   LMP 03/30/2017 (Approximate)   BMI 23.05 kg/m  Physical Exam  Constitutional: She is oriented to person, place, and time. She appears well-developed and well-nourished.  HENT:  Head: Normocephalic and atraumatic.  Eyes: Conjunctivae and EOM are normal.  Neck: Normal range of motion.  Cardiovascular: Normal rate, regular rhythm, normal heart sounds and intact distal  pulses.  Pulmonary/Chest: Effort normal and breath sounds normal.  Abdominal: Soft. Bowel sounds are normal.  Lymphadenopathy:    She has no cervical adenopathy.  Neurological: She is alert and oriented to person, place, and time.  Skin: Skin is warm and dry. Capillary refill takes 2 to 3 seconds.  Hyperpigmentation noted over her upper eyelids and on the dorsum of her fingers.  Psychiatric: She has a normal mood and affect. Her behavior is normal.  Nursing note and vitals reviewed.    Musculoskeletal Exam: C-spine and thoracic lumbar spine good range of motion. Shoulder joints elbow joints wrist joints are good range of motion. No synovitis was noted. She has some warmth and swelling in her right knee joint. The effusion has resolved. None of the other joints were swollen.  CDAI Exam: No CDAI exam completed.    Investigation: No additional findings.PLQ eye exam: 08/15/2016 CBC Latest Ref Rng & Units 04/06/2017 03/30/2017 02/13/2017  WBC 3.8 - 10.8 Thousand/uL 14.4(H) 2.4(L) 2.2(L)  Hemoglobin 11.7 - 15.5 g/dL 11.3(L) 11.1(L) 11.7  Hematocrit 35.0 - 45.0 % 34.4(L) 33.3(L) 35.1  Platelets 140 - 400 Thousand/uL 361 206 232   CMP Latest Ref Rng & Units 04/06/2017 03/30/2017 02/13/2017  Glucose 65 - 99 mg/dL 86 75 72  BUN 7 - 25 mg/dL 17 17 10   Creatinine 0.50 - 1.10 mg/dL 4.09 8.11 9.14  Sodium 135 - 146 mmol/L 139 137 138  Potassium 3.5 - 5.3 mmol/L 3.9 4.3 4.1  Chloride 98 - 110 mmol/L 107 106 104  CO2 20 - 32 mmol/L 25 26 27   Calcium 8.6 - 10.2 mg/dL 9.2 7.8(G) 9.4  Total Protein 6.1 - 8.1 g/dL - 7.4 9.5(A)  Total Bilirubin 0.2 - 1.2 mg/dL - 0.3 0.5  Alkaline Phos 33 - 115 U/L - - -  AST 10 - 30 U/L - 33(H) 19  ALT 6 - 29 U/L - 26 17    Imaging: Dg Chest 2 View  Result Date: 04/08/2017 CLINICAL DATA:  Cough. High risk medication use, initiating methotrexate use. EXAM: CHEST  2 VIEW COMPARISON:  None. FINDINGS: The cardiomediastinal silhouette is unremarkable. There is no  evidence of focal airspace disease, pulmonary edema, suspicious pulmonary nodule/mass, pleural effusion, or pneumothorax. No acute bony abnormalities are identified. IMPRESSION: No active cardiopulmonary disease. Electronically Signed   By: Harmon Pier M.D.   On: 04/08/2017 15:39    Speciality Comments: Baseline PLQ Eye Exam: 08/15/16 WNL follow up in 1 year    Procedures:  No procedures performed Allergies: Patient has no known allergies.   Assessment / Plan:     Visit Diagnoses: Other systemic lupus erythematosus with other organ involvement (HCC) - History of fatigue, weight loss, hair loss, malar rash, Raynauds,skin lupus, neutropenia, elevated sedimentation rate, positive ANA,ds DNA, Sm, Ro, low C3. Patient has severe flare which is improving after prednisone taper. She is currently on 40 mg a day. She's very anxious about the disease processes and cried several times during the conversation. She's having some side effects from the methotrexate mostly nausea and fatigue. I will add Zofran 4 mg by mouth every 6 hours when necessary.  High risk medication use - Plaquenil 200mg  po bid M-F,  Prednisone 40 mg by mouth daily, methotrexate 4 tablets by mouth every week for the last 2 weeks and folic acid 2 mg by mouth daily. Her last eye exam was May 2018. She will get her labs today and then every 2 weeks to monitor for drug toxicity. We had detailed discussion regarding prednisone taper. She will cut down to 35 mg by mouth every morning and then taper by 5 mg per week until she reaches 20 mg by mouth daily. I will see her back in 2 weeks.  Raynaud's disease without gangrene - Healing ucerations on all digits.  She has perionychial dark discoloration.   She received a cream from her dermatologist, her rash is improving. Warm clothing for Raynaud's phenomenon were discussed.  Bilateral knee effusions: Perfusion is almost resolved. She has some warmth and swelling in her right knee joint.  Rash - Need  for using sunscreen on a regular basis was emphasized.  She recently saw her dermatologist.    Depression and anxiety: She was quite tearful during the conversation. She states she's very anxious about the disease processes and the medication usage.. Detailed discussion regarding that. I discussed the option of adding Lexapro to her regimen. She wants to hold off right now. She was to discuss this further with her family members and also with her PCP. Effect follow-up visit she is a still anxious and dealing with depression then we will start her on Lexapro 10 mg by mouth daily at bedtime.  Insomnia: I believe her insomnia is related to prednisone dosage. Once she started taking prednisone in the morning her insomnia should improve. We also discussed over-the-counter sleeping aids.  Hair loss - Denies any recent hair loss  History of asthma  History of vitamin D deficiency - She was advised to continue taking Vitamin D supplements daily.  Her vitamin D was WNL at 7438.  Palpitations - patient reports a workup by Dr.Ganji  was negative.   Orders: Orders Placed This Encounter  Procedures  . CBC with Differential/Platelet  . COMPLETE METABOLIC PANEL WITH GFR  . Protein / creatinine ratio, urine   Meds ordered this encounter  Medications  . ondansetron (ZOFRAN) 4 MG tablet    Sig: Take 1 tablet (4 mg total) by mouth every 6 (six) hours as needed for nausea or vomiting.    Dispense:  30 tablet    Refill:  4    Face-to-face time spent with patient was 60 minutes. Greater than 50% of time was spent in counseling and coordination of care.  Follow-Up Instructions: Return in about 2 weeks (around 05/04/2017) for SLE.   Pollyann SavoyShaili Dorrian Doggett, MD  Note - This record has been created using Animal nutritionistDragon software.  Chart creation errors have been sought, but may not always  have been located. Such creation errors do not reflect on  the standard of medical care.

## 2017-04-08 ENCOUNTER — Telehealth: Payer: Self-pay | Admitting: Obstetrics & Gynecology

## 2017-04-08 ENCOUNTER — Ambulatory Visit (HOSPITAL_COMMUNITY)
Admission: RE | Admit: 2017-04-08 | Discharge: 2017-04-08 | Disposition: A | Discharge status: Home / Self Care | Payer: 59 | Source: Ambulatory Visit | Attending: Physician Assistant | Admitting: Physician Assistant

## 2017-04-08 DIAGNOSIS — Z79899 Other long term (current) drug therapy: Secondary | ICD-10-CM | POA: Diagnosis present

## 2017-04-08 NOTE — Telephone Encounter (Signed)
Called and left a message for patient to call back to schedule a new patient doctor referral appointment with our office discuss starting oral contraception. °

## 2017-04-08 NOTE — Progress Notes (Signed)
CXR revealed no active cardiopulmonary disease.

## 2017-04-09 ENCOUNTER — Telehealth: Payer: Self-pay | Admitting: Rheumatology

## 2017-04-09 NOTE — Telephone Encounter (Signed)
Patient returning call.

## 2017-04-09 NOTE — Telephone Encounter (Signed)
Patient advised CXR is normal and she may start MTX.  Reviewed they lab schedule as well as the way she is to take the medication with her.

## 2017-04-09 NOTE — Telephone Encounter (Signed)
Patient calling with questions due to CXR. Did we get results, next set in starting meds. Please call to discuss.

## 2017-04-10 NOTE — Telephone Encounter (Signed)
Called and left a message for patient to call back to schedule a new patient doctor referral appointment with our office discuss starting oral contraception.

## 2017-04-13 ENCOUNTER — Telehealth: Payer: Self-pay | Admitting: Rheumatology

## 2017-04-13 NOTE — Telephone Encounter (Signed)
Patient called stating that she is tapering off her Prednisone and has a few questions.  CB# [phone]

## 2017-04-13 NOTE — Telephone Encounter (Signed)
Patient wanted to know how much prednisone she should be on for her prednisone taper.Advised patient she should be on 4 tabs. Patient verbalized understanding.

## 2017-04-20 ENCOUNTER — Ambulatory Visit: Payer: 59 | Admitting: Rheumatology

## 2017-04-20 ENCOUNTER — Encounter: Payer: Self-pay | Admitting: Rheumatology

## 2017-04-20 VITALS — BP 130/90 | HR 104 | Resp 15 | Ht 62.0 in | Wt 126.0 lb

## 2017-04-20 DIAGNOSIS — Z8639 Personal history of other endocrine, nutritional and metabolic disease: Secondary | ICD-10-CM | POA: Diagnosis not present

## 2017-04-20 DIAGNOSIS — I7300 Raynaud's syndrome without gangrene: Secondary | ICD-10-CM

## 2017-04-20 DIAGNOSIS — Z79899 Other long term (current) drug therapy: Secondary | ICD-10-CM

## 2017-04-20 DIAGNOSIS — D72818 Other decreased white blood cell count: Secondary | ICD-10-CM

## 2017-04-20 DIAGNOSIS — R002 Palpitations: Secondary | ICD-10-CM | POA: Diagnosis not present

## 2017-04-20 DIAGNOSIS — Z8709 Personal history of other diseases of the respiratory system: Secondary | ICD-10-CM | POA: Diagnosis not present

## 2017-04-20 DIAGNOSIS — M25462 Effusion, left knee: Secondary | ICD-10-CM | POA: Diagnosis not present

## 2017-04-20 DIAGNOSIS — R21 Rash and other nonspecific skin eruption: Secondary | ICD-10-CM | POA: Diagnosis not present

## 2017-04-20 DIAGNOSIS — L659 Nonscarring hair loss, unspecified: Secondary | ICD-10-CM

## 2017-04-20 DIAGNOSIS — M25461 Effusion, right knee: Secondary | ICD-10-CM | POA: Diagnosis not present

## 2017-04-20 DIAGNOSIS — M3219 Other organ or system involvement in systemic lupus erythematosus: Secondary | ICD-10-CM | POA: Diagnosis not present

## 2017-04-20 MED ORDER — ONDANSETRON HCL 4 MG PO TABS: 4.0000 mg | ORAL_TABLET | Freq: Four times a day (QID) | ORAL | 4 refills | Status: DC | PRN

## 2017-04-20 NOTE — Patient Instructions (Signed)
Prednisone Taper:   3.5 tablets by mouth from 04/20/17- 04/26/17 3 tablets by mouth from 04/27/17- 05/04/17 2.5 tablets by mouth from 05/05/17- 05/11/17 2 tablet by mouth from 05/12/17-05/18/17 1.5 tabs by mouth daily 05/19/17-05/25/17 then 1 tab po daily until otherwise indicated

## 2017-04-21 LAB — TEST AUTHORIZATION

## 2017-04-21 LAB — CBC WITH DIFFERENTIAL/PLATELET
BASOS ABS: 0 {cells}/uL (ref 0–200)
BASOS PCT: 0 %
EOS ABS: 11 {cells}/uL — AB (ref 15–500)
Eosinophils Relative: 0.2 %
HEMATOCRIT: 31.8 % — AB (ref 35.0–45.0)
HEMOGLOBIN: 10.4 g/dL — AB (ref 11.7–15.5)
LYMPHS ABS: 1977 {cells}/uL (ref 850–3900)
MCH: 29.3 pg (ref 27.0–33.0)
MCHC: 32.7 g/dL (ref 32.0–36.0)
MCV: 89.6 fL (ref 80.0–100.0)
MONOS PCT: 8.3 %
MPV: 9.7 fL (ref 7.5–12.5)
NEUTROS ABS: 3147 {cells}/uL (ref 1500–7800)
Neutrophils Relative %: 56.2 %
Platelets: 205 10*3/uL (ref 140–400)
RBC: 3.55 10*6/uL — ABNORMAL LOW (ref 3.80–5.10)
RDW: 14.5 % (ref 11.0–15.0)
Total Lymphocyte: 35.3 %
WBC: 5.6 10*3/uL (ref 3.8–10.8)
WBCMIX: 465 {cells}/uL (ref 200–950)

## 2017-04-21 LAB — PROTEIN / CREATININE RATIO, URINE
Creatinine, Urine: 127 mg/dL (ref 20–275)
PROTEIN/CREAT RATIO: 94 mg/g{creat} (ref 21–161)
Total Protein, Urine: 12 mg/dL (ref 5–24)

## 2017-04-21 LAB — COMPLETE METABOLIC PANEL WITH GFR
AG RATIO: 1.2 (calc) (ref 1.0–2.5)
ALT: 12 U/L (ref 6–29)
AST: 11 U/L (ref 10–30)
Albumin: 4.1 g/dL (ref 3.6–5.1)
Alkaline phosphatase (APISO): 26 U/L — ABNORMAL LOW (ref 33–115)
BUN: 14 mg/dL (ref 7–25)
CALCIUM: 9.4 mg/dL (ref 8.6–10.2)
CO2: 28 mmol/L (ref 20–32)
CREATININE: 0.74 mg/dL (ref 0.50–1.10)
Chloride: 104 mmol/L (ref 98–110)
GFR, EST AFRICAN AMERICAN: 134 mL/min/{1.73_m2} (ref >=60)
GFR, Est Non African American: 116 mL/min/{1.73_m2} (ref >=60)
Globulin: 3.3 g/dL (calc) (ref 1.9–3.7)
Glucose, Bld: 86 mg/dL (ref 65–99)
POTASSIUM: 3.6 mmol/L (ref 3.5–5.3)
Sodium: 139 mmol/L (ref 135–146)
TOTAL PROTEIN: 7.4 g/dL (ref 6.1–8.1)
Total Bilirubin: 0.4 mg/dL (ref 0.2–1.2)

## 2017-04-21 LAB — PHOSPHORUS: Phosphorus: 4.1 mg/dL (ref 2.5–4.5)

## 2017-04-21 NOTE — Progress Notes (Signed)
Add P to her labs. Please ask Pt to take MVI with Iron.

## 2017-04-23 NOTE — Progress Notes (Signed)
Office Visit Note  Patient: Brittany Preston             Date of Birth: November 03, 1995           MRN: [id number]             PCP: Verlon AuBoyd, Tammy Lamonica, MD Referring: Verlon AuBoyd, Tammy Lamonica, MD Visit Date: 05/01/2017 Occupation: @GUAROCC @    Subjective:  Hip joint stiffness    History of Present Illness: Brittany Preston is a 22 y.o. female with history of systemic lupus erythematosus, Raynaud's, and vitamin D deficiency.  Patient states she is on Prednisone 15 mg, MTX 6 tablets weekly, and folic acid 2 mg daily. Her fatigue has improved. She states she has had joint stiffness in bilateral hips.  She denies any joint pain or joint swelling.  She states that her bilateral knee swelling has improved.  She denies any weight loss.  Denies any oral or nasal ulcers.  She denies any hair loss.  She reports the rash on her hands is improving with the topical cream prescribed by her dermatologist.  She denies any recent palpitations.  She states that her insomnia has improved while tapering her dose of Prednisone.  She denies any side effects from MTX or PLQ.  She had labs performed on 04/20/17.  Activities of Daily Living:  Patient reports morning stiffness for 10  minutes.   Patient Reports nocturnal pain.  Difficulty dressing/grooming: Denies Difficulty climbing stairs: Denies Difficulty getting out of chair: Denies Difficulty using hands for taps, buttons, cutlery, and/or writing: Denies   Review of Systems  Constitutional: Negative.  Negative for fatigue.  HENT: Negative for mouth dryness.   Eyes: Negative for dryness.  Respiratory: Negative.  Negative for cough, shortness of breath and difficulty breathing.   Cardiovascular: Negative.  Negative for palpitations, hypertension and irregular heartbeat.  Gastrointestinal: Negative.  Negative for blood in stool, constipation and diarrhea.  Endocrine: Negative.   Genitourinary: Negative.  Negative for nocturia.  Musculoskeletal: Positive for  arthralgias and joint pain. Negative for joint swelling, myalgias, muscle weakness, morning stiffness and myalgias.  Skin: Negative.  Negative for color change, rash, hair loss, ulcers and sensitivity to sunlight.  Neurological: Negative.  Negative for headaches.  Hematological: Negative.  Negative for swollen glands.  Psychiatric/Behavioral: Negative.  Negative for depressed mood and sleep disturbance. The patient is not nervous/anxious.     PMFS History:  Patient Active Problem List   Diagnosis Date Noted  . Systemic lupus erythematosus (HCC) 11/10/2016  . Vitamin D deficiency 11/06/2016  . Hair loss 10/14/2016  . Leukopenia 10/14/2016  . History of asthma 10/14/2016  . High risk medication use 10/14/2016    Past Medical History:  Diagnosis Date  . Asthma     History reviewed. No pertinent family history. History reviewed. No pertinent surgical history. Social History   Social History Narrative  . Not on file     Objective: Vital Signs: BP 134/75   Pulse (!) 118   Resp 14   Ht 5\' 2"  (1.575 m)   Wt 128 lb (58.1 kg)   BMI 23.41 kg/m    Physical Exam  Constitutional: She is oriented to person, place, and time. She appears well-developed and well-nourished.  HENT:  Head: Normocephalic and atraumatic.  No oral or nasal ulcers  Eyes: Conjunctivae and EOM are normal.  Neck: Normal range of motion.  Cardiovascular: Normal rate, regular rhythm, normal heart sounds and intact distal pulses.  Pulmonary/Chest: Effort normal and breath  sounds normal.  Abdominal: Soft. Bowel sounds are normal.  Lymphadenopathy:    She has no cervical adenopathy.  Neurological: She is alert and oriented to person, place, and time.  Skin: Skin is warm and dry. Capillary refill takes less than 2 seconds.  Healing ulcers on bilateral hands   Psychiatric: She has a normal mood and affect. Her behavior is normal.  Nursing note and vitals reviewed.    Musculoskeletal Exam: C-spine, thoracic  spine, lumbar spine good range of motion.  Shoulder joints, elbow joints, wrist joints, MCPs, PIPs, DIPs good range of motion with no synovitis.  She has healing ulcerations on bilateral hands.  Hip joints, knee joints, ankle joints, MTPs, PIPs, DIPs good range of motion with no synovitis.  She continues to have a mild effusion of the left knee no warmth or crepitus on exam.  No tenderness of trochanteric bursa.   CDAI Exam: No CDAI exam completed.    Investigation: No additional findings.PLQ eye exam: 08/15/2016 CBC Latest Ref Rng & Units 04/20/2017 04/06/2017 03/30/2017  WBC 3.8 - 10.8 Thousand/uL 5.6 14.4(H) 2.4(L)  Hemoglobin 11.7 - 15.5 g/dL 10.4(L) 11.3(L) 11.1(L)  Hematocrit 35.0 - 45.0 % 31.8(L) 34.4(L) 33.3(L)  Platelets 140 - 400 Thousand/uL 205 361 206   CMP Latest Ref Rng & Units 04/20/2017 04/06/2017 03/30/2017  Glucose 65 - 99 mg/dL 86 86 75  BUN 7 - 25 mg/dL 14 17 17   Creatinine 0.50 - 1.10 mg/dL 1.61 0.96 0.45  Sodium 135 - 146 mmol/L 139 139 137  Potassium 3.5 - 5.3 mmol/L 3.6 3.9 4.3  Chloride 98 - 110 mmol/L 104 107 106  CO2 20 - 32 mmol/L 28 25 26   Calcium 8.6 - 10.2 mg/dL 9.4 9.2 4.0(J)  Total Protein 6.1 - 8.1 g/dL 7.4 - 7.4  Total Bilirubin 0.2 - 1.2 mg/dL 0.4 - 0.3  Alkaline Phos 33 - 115 U/L - - -  AST 10 - 30 U/L 11 - 33(H)  ALT 6 - 29 U/L 12 - 26    Imaging: Dg Chest 2 View  Result Date: 04/08/2017 CLINICAL DATA:  Cough. High risk medication use, initiating methotrexate use. EXAM: CHEST  2 VIEW COMPARISON:  None. FINDINGS: The cardiomediastinal silhouette is unremarkable. There is no evidence of focal airspace disease, pulmonary edema, suspicious pulmonary nodule/mass, pleural effusion, or pneumothorax. No acute bony abnormalities are identified. IMPRESSION: No active cardiopulmonary disease. Electronically Signed   By: Harmon Pier M.D.   On: 04/08/2017 15:39    Speciality Comments: Baseline PLQ Eye Exam: 08/15/16 WNL follow up in 1 year    Procedures:  No  procedures performed Allergies: Patient has no known allergies.   Assessment / Plan:     Visit Diagnoses: Other systemic lupus erythematosus with other organ involvement (HCC) - History of fatigue, weight loss, hair loss, malar rash, Raynauds,skin lupus, neutropenia, elevated sedimentation rate, positive ANA,ds DNA, Sm, Ro, low C3.  She is doing much better on methotrexate and Plaquenil combination.  Her arthralgias and synovitis has resolved.  Her rash has resolved.  She is currently on 6 tablets p.o. weekly.  She will get labs and 1 week and then we will increase her methotrexate to 8 tablets p.o. weekly along with folic acid 2 mg p.o. daily.  She will get lab work in 2 weeks and then every 2 months to monitor for drug side effects.  High risk medication use - PLQ 200 mg p.o. twice daily Monday through Friday, MTX 6 tablets p.o. weekly, prednisone  15 mg p.o. daily, folic acid 2 mg p.o. 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  Rash -her rash is improving.  She has been followed by dermatologist and using topical agents.  History of asthma  History of vitamin D deficiency -she is on vitamin D supplement.  History of palpitations - patient reports a workup by Dr.Ganji  was negative.  Hair loss - Denies any recent hair loss    Orders: No orders of the defined types were placed in this encounter.  Meds ordered this encounter  Medications  . methotrexate (RHEUMATREX) 2.5 MG tablet    Sig: Caution:Chemotherapy. Protect from light. Take 8 tablets once weekly.    Dispense:  32 tablet    Refill:  0  . folic acid (FOLVITE) 1 MG tablet    Sig: Take 2 tablets (2 mg total) by mouth daily.    Dispense:  180 tablet    Refill:  3      Follow-Up Instructions: Return in about 2 months (around 06/29/2017) for Systemic lupus erythematosus.   Pollyann Savoy, MD  Note - This record has been created using Animal nutritionist.  Chart  creation errors have been sought, but may not always  have been located. Such creation errors do not reflect on  the standard of medical care.

## 2017-05-01 ENCOUNTER — Ambulatory Visit (INDEPENDENT_AMBULATORY_CARE_PROVIDER_SITE_OTHER): Payer: 59 | Admitting: Rheumatology

## 2017-05-01 ENCOUNTER — Encounter: Payer: Self-pay | Admitting: Rheumatology

## 2017-05-01 VITALS — BP 134/75 | HR 118 | Resp 14 | Ht 62.0 in | Wt 128.0 lb

## 2017-05-01 DIAGNOSIS — Z79899 Other long term (current) drug therapy: Secondary | ICD-10-CM | POA: Diagnosis not present

## 2017-05-01 DIAGNOSIS — Z8639 Personal history of other endocrine, nutritional and metabolic disease: Secondary | ICD-10-CM

## 2017-05-01 DIAGNOSIS — Z87898 Personal history of other specified conditions: Secondary | ICD-10-CM

## 2017-05-01 DIAGNOSIS — G4709 Other insomnia: Secondary | ICD-10-CM | POA: Diagnosis not present

## 2017-05-01 DIAGNOSIS — R21 Rash and other nonspecific skin eruption: Secondary | ICD-10-CM

## 2017-05-01 DIAGNOSIS — Z8709 Personal history of other diseases of the respiratory system: Secondary | ICD-10-CM | POA: Diagnosis not present

## 2017-05-01 DIAGNOSIS — I7300 Raynaud's syndrome without gangrene: Secondary | ICD-10-CM

## 2017-05-01 DIAGNOSIS — L659 Nonscarring hair loss, unspecified: Secondary | ICD-10-CM | POA: Diagnosis not present

## 2017-05-01 DIAGNOSIS — M3219 Other organ or system involvement in systemic lupus erythematosus: Secondary | ICD-10-CM

## 2017-05-01 MED ORDER — FOLIC ACID 1 MG PO TABS: 2.0000 mg | ORAL_TABLET | Freq: Every day | ORAL | 3 refills | Status: DC

## 2017-05-01 MED ORDER — METHOTREXATE 2.5 MG PO TABS: ORAL_TABLET | ORAL | 0 refills | Status: DC

## 2017-05-01 NOTE — Patient Instructions (Signed)
Standing Labs We placed an order today for your standing lab work.    Please come back and get your standing labs in 1 week, 2 weeks and then every 2 months  We have open lab Monday through Friday from 8:30-11:30 AM and 1:30-4 PM at the office of Dr. Pollyann SavoyShaili Darci Lykins.   The office is located at [address], [address], [place], [place] [address] No appointment is necessary.   Labs are drawn by [facility].  You may receive a bill from [facility] for your lab work. If you have any questions regarding directions or hours of operation,  please call [phone].

## 2017-05-07 ENCOUNTER — Other Ambulatory Visit: Payer: Self-pay

## 2017-05-07 DIAGNOSIS — Z79899 Other long term (current) drug therapy: Secondary | ICD-10-CM

## 2017-05-07 LAB — COMPLETE METABOLIC PANEL WITH GFR
AG RATIO: 1.5 (calc) (ref 1.0–2.5)
ALT: 14 U/L (ref 6–29)
AST: 12 U/L (ref 10–30)
Albumin: 4 g/dL (ref 3.6–5.1)
Alkaline phosphatase (APISO): 28 U/L — ABNORMAL LOW (ref 33–115)
BUN: 12 mg/dL (ref 7–25)
CALCIUM: 9.7 mg/dL (ref 8.6–10.2)
CO2: 29 mmol/L (ref 20–32)
CREATININE: 0.83 mg/dL (ref 0.50–1.10)
Chloride: 104 mmol/L (ref 98–110)
GFR, EST AFRICAN AMERICAN: 117 mL/min/{1.73_m2} (ref >=60)
GFR, EST NON AFRICAN AMERICAN: 101 mL/min/{1.73_m2} (ref >=60)
GLOBULIN: 2.7 g/dL (ref 1.9–3.7)
Glucose, Bld: 70 mg/dL (ref 65–99)
POTASSIUM: 4 mmol/L (ref 3.5–5.3)
Sodium: 140 mmol/L (ref 135–146)
TOTAL PROTEIN: 6.7 g/dL (ref 6.1–8.1)
Total Bilirubin: 0.3 mg/dL (ref 0.2–1.2)

## 2017-05-07 LAB — CBC WITH DIFFERENTIAL/PLATELET
BASOS ABS: 11 {cells}/uL (ref 0–200)
BASOS PCT: 0.2 %
EOS ABS: 11 {cells}/uL — AB (ref 15–500)
Eosinophils Relative: 0.2 %
HEMATOCRIT: 32.6 % — AB (ref 35.0–45.0)
Hemoglobin: 10.9 g/dL — ABNORMAL LOW (ref 11.7–15.5)
Lymphs Abs: 1566 cells/uL (ref 850–3900)
MCH: 30.7 pg (ref 27.0–33.0)
MCHC: 33.4 g/dL (ref 32.0–36.0)
MCV: 91.8 fL (ref 80.0–100.0)
MONOS PCT: 10.7 %
MPV: 9.5 fL (ref 7.5–12.5)
Neutro Abs: 3235 cells/uL (ref 1500–7800)
Neutrophils Relative %: 59.9 %
Platelets: 293 10*3/uL (ref 140–400)
RBC: 3.55 10*6/uL — ABNORMAL LOW (ref 3.80–5.10)
RDW: 16.1 % — ABNORMAL HIGH (ref 11.0–15.0)
Total Lymphocyte: 29 %
WBC: 5.4 10*3/uL (ref 3.8–10.8)
WBCMIX: 578 {cells}/uL (ref 200–950)

## 2017-05-16 ENCOUNTER — Other Ambulatory Visit: Payer: Self-pay | Admitting: Physician Assistant

## 2017-05-16 DIAGNOSIS — M3219 Other organ or system involvement in systemic lupus erythematosus: Secondary | ICD-10-CM

## 2017-05-18 ENCOUNTER — Other Ambulatory Visit: Payer: Self-pay

## 2017-05-18 DIAGNOSIS — Z79899 Other long term (current) drug therapy: Secondary | ICD-10-CM

## 2017-05-19 LAB — CBC WITH DIFFERENTIAL/PLATELET
BASOS ABS: 0 {cells}/uL (ref 0–200)
Basophils Relative: 0 %
EOS PCT: 0.2 %
Eosinophils Absolute: 9 cells/uL — ABNORMAL LOW (ref 15–500)
HEMATOCRIT: 34.7 % — AB (ref 35.0–45.0)
Hemoglobin: 11.6 g/dL — ABNORMAL LOW (ref 11.7–15.5)
LYMPHS ABS: 1822 {cells}/uL (ref 850–3900)
MCH: 31.1 pg (ref 27.0–33.0)
MCHC: 33.4 g/dL (ref 32.0–36.0)
MCV: 93 fL (ref 80.0–100.0)
MPV: 9.9 fL (ref 7.5–12.5)
Monocytes Relative: 9.2 %
NEUTROS PCT: 49.2 %
Neutro Abs: 2165 cells/uL (ref 1500–7800)
Platelets: 243 10*3/uL (ref 140–400)
RBC: 3.73 10*6/uL — ABNORMAL LOW (ref 3.80–5.10)
RDW: 16.1 % — AB (ref 11.0–15.0)
Total Lymphocyte: 41.4 %
WBC mixed population: 405 cells/uL (ref 200–950)
WBC: 4.4 10*3/uL (ref 3.8–10.8)

## 2017-05-19 LAB — COMPLETE METABOLIC PANEL WITH GFR
AG Ratio: 1.6 (calc) (ref 1.0–2.5)
ALT: 12 U/L (ref 6–29)
AST: 13 U/L (ref 10–30)
Albumin: 4.3 g/dL (ref 3.6–5.1)
Alkaline phosphatase (APISO): 31 U/L — ABNORMAL LOW (ref 33–115)
BUN: 10 mg/dL (ref 7–25)
CALCIUM: 9.5 mg/dL (ref 8.6–10.2)
CHLORIDE: 106 mmol/L (ref 98–110)
CO2: 28 mmol/L (ref 20–32)
Creat: 0.78 mg/dL (ref 0.50–1.10)
GFR, EST AFRICAN AMERICAN: 126 mL/min/{1.73_m2} (ref >=60)
GFR, Est Non African American: 109 mL/min/{1.73_m2} (ref >=60)
Globulin: 2.7 g/dL (calc) (ref 1.9–3.7)
Glucose, Bld: 78 mg/dL (ref 65–99)
Potassium: 3.6 mmol/L (ref 3.5–5.3)
Sodium: 139 mmol/L (ref 135–146)
TOTAL PROTEIN: 7 g/dL (ref 6.1–8.1)
Total Bilirubin: 0.3 mg/dL (ref 0.2–1.2)

## 2017-05-19 NOTE — Progress Notes (Signed)
Labs are stable.

## 2017-05-29 ENCOUNTER — Encounter: Payer: Self-pay | Admitting: Obstetrics & Gynecology

## 2017-05-29 ENCOUNTER — Telehealth: Payer: Self-pay | Admitting: Rheumatology

## 2017-05-29 ENCOUNTER — Other Ambulatory Visit: Payer: Self-pay

## 2017-05-29 ENCOUNTER — Ambulatory Visit: Payer: 59 | Admitting: Obstetrics & Gynecology

## 2017-05-29 VITALS — BP 112/56 | HR 110 | Resp 18 | Ht 63.0 in | Wt 130.0 lb

## 2017-05-29 DIAGNOSIS — M3219 Other organ or system involvement in systemic lupus erythematosus: Secondary | ICD-10-CM

## 2017-05-29 NOTE — Telephone Encounter (Signed)
Patient called stating that she has questions regarding her Prednisone.  Patient states that she has weaned down to 1 pill per day and wants to know if she should continue this dose until she runs out of pills.

## 2017-05-29 NOTE — Progress Notes (Signed)
22 y.o. G0P0000 SingleAfrican AmericanF here for new patient annual exam.  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  Here to discuss options for contraception.  Considering being SA and would like to know her options.  She has been diagnosed with lupus and is begin followed by Dr. Corliss Skainseveshwar.  She has never had a DVT or PE but is aware that estrogen containing products would be the last options for her.  She has not done much research.    POPs, Nexplanon, Depo Provera, Mirena/Kyleena/Skyla IUD all discussed.  Placement (if applicable), side effects, risks all discussed.  Information regarding each of these provided to pt as well.  Patient's last menstrual period was 05/19/2017.          Sexually active: No.  The current method of family planning is abstinence.    Exercising: Yes.    cardio Smoker:  no  Health Maintenance: Pap:  Never MMG:  Never TDaP:  Will check   Gardasil: Completed  Screening Labs: PCP   reports that  has never smoked. she has never used smokeless tobacco. She reports that she does not drink alcohol or use drugs.  Past Medical History:  Diagnosis Date  . Asthma   . Lupus 06/2016    History reviewed. No pertinent surgical history.  Current Outpatient Medications  Medication Sig Dispense Refill  . albuterol (PROVENTIL HFA;VENTOLIN HFA) 108 (90 Base) MCG/ACT inhaler Inhale into the lungs as needed.     . budesonide (PULMICORT FLEXHALER) 180 MCG/ACT inhaler Inhale into the lungs as needed.     . Cholecalciferol (VITAMIN D3) 2000 units TABS Take by mouth daily.    Marland Kitchen. DHEA 25 MG CAPS Take by mouth daily.    . fluocinonide cream (LIDEX) 0.05 % as needed.     . folic acid (FOLVITE) 1 MG tablet Take 2 tablets (2 mg total) by mouth daily. 180 tablet 3  . hydrocortisone (AQUANIL HC) 1 % lotion Apply 1 application topically as needed.     . hydroxychloroquine (PLAQUENIL) 200 MG tablet TAKE 1 TABLET BY MOUTH  TWICE A DAY MONDAY THROUGH  FRIDAY 120 tablet 0  .  ibuprofen (ADVIL,MOTRIN) 200 MG tablet Take 200 mg by mouth as needed.    . methotrexate (RHEUMATREX) 2.5 MG tablet Caution:Chemotherapy. Protect from light. Take 8 tablets once weekly. 32 tablet 0  . Multiple Vitamin (MULTIVITAMIN) tablet Take 1 tablet by mouth daily.    . ondansetron (ZOFRAN) 4 MG tablet Take 1 tablet (4 mg total) by mouth every 6 (six) hours as needed for nausea or vomiting. 30 tablet 4  . predniSONE (DELTASONE) 10 MG tablet 6 tabs po qd x 1 wk, 5 tabs po qd x 1 wk, 4 tabs po qd x 1 wk, 3.5 tabs po qd x 1 wk, 3 tabs po qd x 1 wk, 2.5 tabs po qd x 1 wk, 2 tabs po qd x 2 wks, 1.5 tabs po qd x 2 wks, then 1 tab po daily 250 tablet 0  . triamcinolone cream (KENALOG) 0.5 % Apply 1 application topically as needed.      No current facility-administered medications for this visit.     History reviewed. No pertinent family history.  ROS:  Pertinent items are noted in HPI.  Otherwise, a comprehensive ROS was negative.  Exam:   BP (!) 112/56 (BP Location: Left Arm, Patient Position: Sitting, Cuff Size: Normal)   Pulse (!) 110   Resp 18   Ht 5'  3" (1.6 m)   Wt 130 lb (59 kg)   LMP 05/19/2017   BMI 23.03 kg/m     Height: 5\' 3"  (160 cm)  Ht Readings from Last 3 Encounters:  05/29/17 5\' 3"  (1.6 m)  05/01/17 5\' 2"  (1.575 m)  04/20/17 5\' 2"  (1.575 m)    General appearance: alert, cooperative and appears stated age No exam performed today  A:  Desires contraception SLE Asthma  P:   Information is provided regarding options for contraception.  She will consider options.  Pt is aware that she does need a routine exam with Pap smear.  Does not desire to do this today.  States she will plan to schedule this.  ~30 minutes spent with patient >50% of time was in face to face discussion of above.

## 2017-06-01 ENCOUNTER — Encounter: Payer: Self-pay | Admitting: Obstetrics & Gynecology

## 2017-06-01 MED ORDER — METHOTREXATE 2.5 MG PO TABS: ORAL_TABLET | ORAL | 0 refills | Status: DC

## 2017-06-01 NOTE — Telephone Encounter (Signed)
Okay to refill methotrexate.  She should continue prednisone 10 mg p.o. q. daily for right now.  I will check autoimmune disease labs at next visit and will make decision regarding taper.

## 2017-06-01 NOTE — Telephone Encounter (Signed)
Patient has been on Prednisone taper. Patient wants to make sure she is to continue to take Prednisone 10 mg. Patient advised according to her prescription that was given she should continue Prednisone 10 mg until otherwise advised. Patient is also requesting a refill on MTX.   Last Visit: 05/01/17 Next Visit: 07/08/17 Labs: 05/18/17 Stable  Okay to refill per Dr. Corliss Skainseveshwar.

## 2017-06-03 NOTE — Telephone Encounter (Signed)
Last Visit: 05/01/17 Next Visit: 06/29/17  Okay to refill per Dr. Corliss Skainseveshwar

## 2017-06-08 ENCOUNTER — Telehealth: Payer: Self-pay | Admitting: Rheumatology

## 2017-06-08 NOTE — Telephone Encounter (Signed)
Patient has questions regarding her Prednisone and whether she should continue taking it or if she should be "weaning off the medicine."

## 2017-06-08 NOTE — Telephone Encounter (Signed)
Patient advised she is to continue Prednisone until other indicated. Patient verbalized understanding.

## 2017-06-23 ENCOUNTER — Other Ambulatory Visit: Payer: Self-pay | Admitting: Rheumatology

## 2017-06-23 NOTE — Telephone Encounter (Signed)
Last Visit: 05/01/17 Next Visit: 06/29/17 Labs: 05/18/17 Stable PLQ Eye Exam: 08/15/16 WNL   Okay to refill per Dr. Corliss Skainseveshwar

## 2017-07-08 ENCOUNTER — Ambulatory Visit: Payer: 59 | Admitting: Rheumatology

## 2017-07-08 ENCOUNTER — Encounter: Payer: Self-pay | Admitting: Physician Assistant

## 2017-07-08 ENCOUNTER — Other Ambulatory Visit: Payer: Self-pay | Admitting: Physician Assistant

## 2017-07-08 VITALS — BP 124/86 | HR 99 | Resp 12 | Ht 62.0 in | Wt 134.0 lb

## 2017-07-08 DIAGNOSIS — L659 Nonscarring hair loss, unspecified: Secondary | ICD-10-CM | POA: Diagnosis not present

## 2017-07-08 DIAGNOSIS — M3219 Other organ or system involvement in systemic lupus erythematosus: Secondary | ICD-10-CM | POA: Diagnosis not present

## 2017-07-08 DIAGNOSIS — R21 Rash and other nonspecific skin eruption: Secondary | ICD-10-CM | POA: Diagnosis not present

## 2017-07-08 DIAGNOSIS — G4709 Other insomnia: Secondary | ICD-10-CM | POA: Diagnosis not present

## 2017-07-08 DIAGNOSIS — Z8639 Personal history of other endocrine, nutritional and metabolic disease: Secondary | ICD-10-CM | POA: Diagnosis not present

## 2017-07-08 DIAGNOSIS — Z79899 Other long term (current) drug therapy: Secondary | ICD-10-CM

## 2017-07-08 DIAGNOSIS — I7300 Raynaud's syndrome without gangrene: Secondary | ICD-10-CM

## 2017-07-08 DIAGNOSIS — Z87898 Personal history of other specified conditions: Secondary | ICD-10-CM

## 2017-07-08 DIAGNOSIS — Z8709 Personal history of other diseases of the respiratory system: Secondary | ICD-10-CM

## 2017-07-08 MED ORDER — PREDNISONE 2.5 MG PO TABS: ORAL_TABLET | ORAL | 0 refills | Status: DC

## 2017-07-08 NOTE — Progress Notes (Signed)
Office Visit Note  Patient: Brittany Preston             Date of Birth: December 10, 1995           MRN: [id number]             PCP: Verlon Au, MD Referring: Verlon Au, MD Visit Date: 07/08/2017 Occupation: @GUAROCC @    Subjective:  Medication Management   History of Present Illness: Brittany Preston is a 22 y.o. female with history of systemic lupus erythematosus.  Patient states she continues to take methotrexate 8 tablets weekly, folic acid 2 mg daily, Plaquenil 200 mg twice daily Monday through Friday and prednisone 10 mg daily.  She states that overall she continues to improve.  She denies any joint pain or joint swelling at this time.  She denies any sores in her mouth or nose.  She denies any new rashes.  She denies any swollen lymph nodes.  She denies any palpitations or shortness of breath.  She denies any sicca symptoms.  She states that her fatigue continues to improve.   Activities of Daily Living:  Patient reports morning stiffness for 0 minutes.   Patient Denies nocturnal pain.  Difficulty dressing/grooming: Denies Difficulty climbing stairs: Denies Difficulty getting out of chair: Denies Difficulty using hands for taps, buttons, cutlery, and/or writing: Denies   Review of Systems  Constitutional: Positive for fatigue.  HENT: Negative for mouth sores, mouth dryness and nose dryness.   Eyes: Negative for pain, visual disturbance and dryness.  Respiratory: Negative for cough, hemoptysis, shortness of breath and difficulty breathing.   Cardiovascular: Negative for chest pain, palpitations, hypertension and swelling in legs/feet.  Gastrointestinal: Negative for blood in stool, constipation and diarrhea.  Endocrine: Negative for increased urination.  Genitourinary: Negative for painful urination.  Musculoskeletal: Negative for arthralgias, joint pain, joint swelling, myalgias, muscle weakness, morning stiffness, muscle tenderness and myalgias.  Skin: Negative  for color change, pallor, rash, hair loss, nodules/bumps, skin tightness, ulcers and sensitivity to sunlight.  Allergic/Immunologic: Negative for susceptible to infections.  Neurological: Negative for dizziness, numbness, headaches and weakness.  Hematological: Negative for swollen glands.  Psychiatric/Behavioral: Negative for depressed mood and sleep disturbance. The patient is not nervous/anxious.     PMFS History:  Patient Active Problem List   Diagnosis Date Noted  . Systemic lupus erythematosus (HCC) 11/10/2016  . Vitamin D deficiency 11/06/2016  . Hair loss 10/14/2016  . Leukopenia 10/14/2016  . High risk medication use 10/14/2016  . Moderate persistent asthma without complication 12/10/2015    Past Medical History:  Diagnosis Date  . Asthma   . Lupus (HCC) 06/2016    History reviewed. No pertinent family history. History reviewed. No pertinent surgical history. Social History   Social History Narrative  . Not on file     Objective: Vital Signs: BP 124/86 (BP Location: Left Arm, Patient Position: Sitting, Cuff Size: Small)   Pulse 99   Resp 12   Ht 5\' 2"  (1.575 m)   Wt 134 lb (60.8 kg)   LMP 07/07/2017   BMI 24.51 kg/m    Physical Exam  Constitutional: She is oriented to person, place, and time. She appears well-developed and well-nourished.  HENT:  Head: Normocephalic and atraumatic.  No parotid swelling.  Hyperpigmentation on upper eyelid. No periorbital edema present.  No oral or nasal ulcers noted.  Eyes: Conjunctivae and EOM are normal.  Neck: Normal range of motion.  Cardiovascular: Normal rate, regular rhythm, normal heart sounds  and intact distal pulses.  Pulmonary/Chest: Effort normal and breath sounds normal.  Abdominal: Soft. Bowel sounds are normal.  Lymphadenopathy:    She has no cervical adenopathy.  Neurological: She is alert and oriented to person, place, and time.  Skin: Skin is warm and dry. Capillary refill takes 2 to 3 seconds.    Periungual hyperpigmentation.   Psychiatric: She has a normal mood and affect. Her behavior is normal.  Nursing note and vitals reviewed.    Musculoskeletal Exam: C-spine, thoracic spine, lumbar spine good ROM.  No midline spinal tenderness.  No SI joint tenderness.  Shoulder joints, elbow joints, wrist joints, MCPs, PIPs, and DIPs good ROM with no synovitis.  Hip joints, knee joints, ankle joints, MTPs, PIPs, and DIPs good ROM with synovitis.  No warmth or effusion of bilateral knees.  No tenderness of trochanteric bursa.  CDAI Exam: No CDAI exam completed.    Investigation: No additional findings. CBC Latest Ref Rng & Units 05/18/2017 05/07/2017 04/20/2017  WBC 3.8 - 10.8 Thousand/uL 4.4 5.4 5.6  Hemoglobin 11.7 - 15.5 g/dL 11.6(L) 10.9(L) 10.4(L)  Hematocrit 35.0 - 45.0 % 34.7(L) 32.6(L) 31.8(L)  Platelets 140 - 400 Thousand/uL 243 293 205   CMP Latest Ref Rng & Units 05/18/2017 05/07/2017 04/20/2017  Glucose 65 - 99 mg/dL 78 70 86  BUN 7 - 25 mg/dL 10 12 14   Creatinine 0.50 - 1.10 mg/dL 4.010.78 0.270.83 2.530.74  Sodium 135 - 146 mmol/L 139 140 139  Potassium 3.5 - 5.3 mmol/L 3.6 4.0 3.6  Chloride 98 - 110 mmol/L 106 104 104  CO2 20 - 32 mmol/L 28 29 28   Calcium 8.6 - 10.2 mg/dL 9.5 9.7 9.4  Total Protein 6.1 - 8.1 g/dL 7.0 6.7 7.4  Total Bilirubin 0.2 - 1.2 mg/dL 0.3 0.3 0.4  Alkaline Phos 33 - 115 U/L - - -  AST 10 - 30 U/L 13 12 11   ALT 6 - 29 U/L 12 14 12     Imaging: No results found.  Speciality Comments: Baseline PLQ Eye Exam: 08/15/16 WNL follow up in 1 year    Procedures:  No procedures performed Allergies: Patient has no known allergies.   Assessment / Plan:     Visit Diagnoses: Other systemic lupus erythematosus with other organ involvement (HCC) - History of fatigue, weight loss, hair loss, malar rash, Raynauds, skin lupus, neutropenia, elevated sedimentation rate, positive ANA,ds DNA, Sm, Ro, low C3.  She is clinically doing well on Plaquenil 200 mg twice daily Monday  through Friday, methotrexate 8 tablets weekly, folic acid 2 mg daily and prednisone 10 mg daily.  She has no synovitis on exam.  She has no oral or nasal ulcers.  She has no cervical lymphadenopathy.  She has some hyperpigmentation on the upper eyelid but no new rashes noted.  No periorbital swelling noted.  Her fatigue continues to improve.  We are going to start tapering her prednisone dose by 2.5 mg every 2 weeks keep her at 5 mg until her follow-up visit in 2 months.  A prescription for prednisone will be sent in today.  We are going to recheck her autoimmune labs today.- Plan: CBC with Differential/Platelet, COMPLETE METABOLIC PANEL WITH GFR, Urinalysis, Routine w reflex microscopic, ANA, Anti-DNA antibody, double-stranded, C3 and C4, Sedimentation rate  High risk medication use - MTX 8 tablets weekly, folic acid 2 mg daily, PLQ 664200 mg BID M-F. Prednisone 10 mg daily . PLQ eye exam:Baseline PLQ Eye Exam: 08/15/16 WNL follow up in 1  y.  CBC and CMP were checked today to monitor for drug toxicity.- Plan: CBC with Differential/Platelet, COMPLETE METABOLIC PANEL WITH GFR  Raynaud's syndrome without gangrene: No digital ulcerations or signs of gangrene were noted.  Other insomnia: Improved.  Rash and other nonspecific skin eruption -resolving.  Followed by dermatologist   History of vitamin D deficiency: She takes vitamin D 2000 units daily.  History of palpitations -no recent palpitations.  She was evaluated by Dr. Jacinto Halim  Hair loss: Improved  History of asthma    Orders: Orders Placed This Encounter  Procedures  . CBC with Differential/Platelet  . COMPLETE METABOLIC PANEL WITH GFR  . Urinalysis, Routine w reflex microscopic  . ANA  . Anti-DNA antibody, double-stranded  . C3 and C4  . Sedimentation rate   No orders of the defined types were placed in this encounter.     Follow-Up Instructions: Return in about 2 months (around 09/07/2017) for Systemic lupus erythematosus.   Gearldine Bienenstock, PA-C   I examined and evaluated the patient with Sherron Ales PA.  Patient has no synovitis on examination.  Most of her rash is resolved.  She has residual hyperpigmentation from the prior rash.  The plan of care was discussed as noted above.  Pollyann Savoy, MD  Note - This record has been created using Animal nutritionist.  Chart creation errors have been sought, but may not always  have been located. Such creation errors do not reflect on  the standard of medical care.

## 2017-07-12 LAB — COMPLETE METABOLIC PANEL WITHOUT GFR
AG Ratio: 1.5 (calc) (ref 1.0–2.5)
ALT: 19 U/L (ref 6–29)
AST: 18 U/L (ref 10–30)
Albumin: 4.5 g/dL (ref 3.6–5.1)
Alkaline phosphatase (APISO): 43 U/L (ref 33–115)
BUN: 13 mg/dL (ref 7–25)
CO2: 31 mmol/L (ref 20–32)
Calcium: 10 mg/dL (ref 8.6–10.2)
Chloride: 103 mmol/L (ref 98–110)
Creat: 0.76 mg/dL (ref 0.50–1.10)
GFR, Est African American: 130 mL/min/{1.73_m2}
GFR, Est Non African American: 112 mL/min/{1.73_m2}
Globulin: 3.1 g/dL (ref 1.9–3.7)
Glucose, Bld: 73 mg/dL (ref 65–99)
Potassium: 3.6 mmol/L (ref 3.5–5.3)
Sodium: 140 mmol/L (ref 135–146)
Total Bilirubin: 0.2 mg/dL (ref 0.2–1.2)
Total Protein: 7.6 g/dL (ref 6.1–8.1)

## 2017-07-12 LAB — URINALYSIS, ROUTINE W REFLEX MICROSCOPIC
Bacteria, UA: NONE SEEN /HPF
Bilirubin Urine: NEGATIVE
Glucose, UA: NEGATIVE
Hyaline Cast: NONE SEEN /LPF
Nitrite: NEGATIVE
RBC / HPF: >=60 /HPF — AB (ref 0–2)
Specific Gravity, Urine: 1.025 (ref 1.001–1.03)
pH: 6.5 (ref 5.0–8.0)

## 2017-07-12 LAB — CBC WITH DIFFERENTIAL/PLATELET
BASOS ABS: 20 {cells}/uL (ref 0–200)
Basophils Relative: 0.4 %
EOS PCT: 0.2 %
Eosinophils Absolute: 10 cells/uL — ABNORMAL LOW (ref 15–500)
HEMATOCRIT: 38.3 % (ref 35.0–45.0)
Hemoglobin: 13.2 g/dL (ref 11.7–15.5)
LYMPHS ABS: 813 {cells}/uL — AB (ref 850–3900)
MCH: 32.1 pg (ref 27.0–33.0)
MCHC: 34.5 g/dL (ref 32.0–36.0)
MCV: 93.2 fL (ref 80.0–100.0)
MPV: 10 fL (ref 7.5–12.5)
Monocytes Relative: 7 %
NEUTROS PCT: 75.8 %
Neutro Abs: 3714 cells/uL (ref 1500–7800)
Platelets: 272 10*3/uL (ref 140–400)
RBC: 4.11 10*6/uL (ref 3.80–5.10)
RDW: 13.3 % (ref 11.0–15.0)
Total Lymphocyte: 16.6 %
WBC mixed population: 343 cells/uL (ref 200–950)
WBC: 4.9 10*3/uL (ref 3.8–10.8)

## 2017-07-12 LAB — SEDIMENTATION RATE: SED RATE: 9 mm/h (ref 0–20)

## 2017-07-12 LAB — ANA: Anti Nuclear Antibody(ANA): POSITIVE — AB

## 2017-07-12 LAB — ANTI-DNA ANTIBODY, DOUBLE-STRANDED: ds DNA Ab: 2 IU/mL

## 2017-07-12 LAB — ANTI-NUCLEAR AB-TITER (ANA TITER): ANA Titer 1: >=1:1280 {titer} — AB

## 2017-07-12 LAB — C3 AND C4
C3 Complement: 68 mg/dL — ABNORMAL LOW (ref 83–193)
C4 Complement: 7 mg/dL — ABNORMAL LOW (ref 15–57)

## 2017-07-13 NOTE — Progress Notes (Signed)
Complement levels are improving. Sed rate WNL. DsDNA negative. ANA titer stable. UA revealed 1+ protein, which is stable. She has RBC and Hgb in urine.  Please ask patient if she was on her menstrual cycle.

## 2017-08-25 NOTE — Progress Notes (Signed)
Office Visit Note  Patient: Brittany Preston             Date of Birth: 07-26-1995           MRN: [id number]             PCP: Verlon Au, MD Referring: Verlon Au, MD Visit Date: 09/07/2017 Occupation: @    Subjective:  Hives   History of Present Illness: Brittany Preston is a 21 y.o. female with history of systemic lupus erythematosus and Raynaud's.  She is currently taking methotrexate 8 tablets by mouth once weekly, folic acid 2 mg daily, and Plaquenil 200 mg twice daily Monday through Friday.  She is currently taking prednisone 5 mg by mouth daily.  She reports that on Friday she started developing hives on her torso, arms, and legs.  She denies any trigger.  She denies any recent new medications or foods.  She states that the hives occur sporadically.  She states that she has tried using an anti-itch cream over-the-counter as well as aloe with no relief.  She has not tried any Benadryl or antihistamines.  She denies a facial rash or any other lesions.  She denies any sores in her mouth or nose.  She denies any sicca symptoms or symptoms of Raynaud's.  No digital ulcerations were noted.  No blisters on her fingers were noted today.  She denies any recent hair loss.  She denies any palpitations or shortness of breath.  She reports that she has not had any increased fatigue or low-grade fevers.  No joint pain or joint swelling at this time.  She denies any effusions of her knee joints.  She states that she continues to have some bloating and nausea after taking her methotrexate dose for about 2 to 3 days.  She states that she has been taking Zofran 4 mg PRN and Gas-X helped minimally.  She would not like to switch to the injectable form of methotrexate at this time.   Activities of Daily Living:  Patient reports morning stiffness for 0 minutes.   Patient Denies nocturnal pain.  Difficulty dressing/grooming: Denies Difficulty climbing stairs: Denies Difficulty getting  out of chair: Denies Difficulty using hands for taps, buttons, cutlery, and/or writing: Denies   Review of Systems  Constitutional: Negative for fatigue and fever.  HENT: Negative for ear pain, mouth sores, mouth dryness and nose dryness.   Eyes: Negative for pain, visual disturbance and dryness.  Respiratory: Negative for cough, hemoptysis, shortness of breath and difficulty breathing.   Cardiovascular: Negative for chest pain, palpitations, hypertension and swelling in legs/feet.  Gastrointestinal: Positive for constipation. Negative for blood in stool and diarrhea.  Endocrine: Negative for increased urination.  Genitourinary: Negative for difficulty urinating and painful urination.  Musculoskeletal: Negative for arthralgias, joint pain, joint swelling, myalgias, muscle weakness, morning stiffness, muscle tenderness and myalgias.  Skin: Positive for rash (Hives). Negative for color change, pallor, hair loss, nodules/bumps, skin tightness, ulcers and sensitivity to sunlight.  Allergic/Immunologic: Negative for susceptible to infections.  Neurological: Negative for dizziness, numbness, headaches and weakness.  Hematological: Negative for bruising/bleeding tendency and swollen glands.  Psychiatric/Behavioral: Negative for depressed mood and sleep disturbance. The patient is not nervous/anxious.     PMFS History:  Patient Active Problem List   Diagnosis Date Noted  . Systemic lupus erythematosus (HCC) 11/10/2016  . Vitamin D deficiency 11/06/2016  . Hair loss 10/14/2016  . Leukopenia 10/14/2016  . High risk medication use 10/14/2016  .  Moderate persistent asthma without complication 12/10/2015    Past Medical History:  Diagnosis Date  . Asthma   . Lupus (HCC) 06/2016    History reviewed. No pertinent family history. History reviewed. No pertinent surgical history. Social History   Social History Narrative  . Not on file     Objective: Vital Signs: BP 120/77 (BP Location:  Left Arm, Patient Position: Sitting, Cuff Size: Normal)   Pulse 81   Ht  (1.575 m)   Wt 136 lb (61.7 kg)   BMI 24.87 kg/m    Physical Exam  Constitutional: She is oriented to person, place, and time. She appears well-developed and well-nourished.  HENT:  Head: Normocephalic and atraumatic.  No oral or nasal ulcerations.   Eyes: Conjunctivae and EOM are normal.  Neck: Normal range of motion.  Cardiovascular: Normal rate, regular rhythm, normal heart sounds and intact distal pulses.  Pulmonary/Chest: Effort normal and breath sounds normal.  Abdominal: Soft. Bowel sounds are normal.  Lymphadenopathy:    She has no cervical adenopathy.  Neurological: She is alert and oriented to person, place, and time.  Skin: Skin is warm and dry. Capillary refill takes less than 2 seconds.  Hives present on right upper extremity.   No malar rash noted.  No digital ulcerations.   Psychiatric: She has a normal mood and affect. Her behavior is normal.  Nursing note and vitals reviewed.    Musculoskeletal Exam: C-spine, thoracic spine, and lumbar spine good ROM.  No midline spinal tenderness.  No SI joint tenderness.  Shoulder joints, elbow joints, wrist joints, MCPs, PIPs, and DIPs good ROM with no synovitis.  Hip joints, knee joints, ankle joints, MCPs, PIPs, DIPs good range of motion with no synovitis.  No warmth or effusion of knee joints.  CDAI Exam: No CDAI exam completed.    Investigation: No additional findings.  CBC Latest Ref Rng & Units 07/08/2017 05/18/2017 05/07/2017  WBC 3.8 - 10.8 Thousand/uL 4.9 4.4 5.4  Hemoglobin 11.7 - 15.5 g/dL 60.4 11.6(L) 10.9(L)  Hematocrit 35.0 - 45.0 % 38.3 34.7(L) 32.6(L)  Platelets 140 - 400 Thousand/uL 272 243 293   CMP Latest Ref Rng & Units 07/08/2017 05/18/2017 05/07/2017  Glucose 65 - 99 mg/dL 73 78 70  BUN 7 - 25 mg/dL Creatinine 0.50 - 1.10 mg/dL 5.40 9.81 1.91  Sodium 135 - 146 mmol/L 140 139 140  Potassium 3.5 - 5.3 mmol/L 3.6 3.6  4.0  Chloride 98 - 110 mmol/L 103 106 104  CO2 20 - 32 mmol/L Calcium 8.6 - 10.2 mg/dL 47.8 9.5 9.7  Total Protein 6.1 - 8.1 g/dL 7.6 7.0 6.7  Total Bilirubin 0.2 - 1.2 mg/dL 0.2 0.3 0.3  Alkaline Phos 33 - 115 U/L - - -  AST 10 - 30 U/L ALT 6 - 29 U/L Imaging: No results found.  Speciality Comments: Baseline PLQ Eye Exam: 08/15/16 WNL follow up in 1 year    Procedures:  No procedures performed Allergies: Patient has no known allergies.   Assessment / Plan:     Visit Diagnoses: Other systemic lupus erythematosus with other organ involvement (HCC) -She has not had any recent lupus flares.  On Friday she developed hives on her torso and upper and lower extremities.  She used a topical anti-itch cream as well as aloe with no relief.  She did not take any antihistamines by mouth due to  being unsure if they would interact with her current medications.  She gave a history of recurrent hives when she was younger due to exposure to an allergen.  She was advised to take Zyrtec daily and she can continue to use anti-itch cream topically.  We also discussed the importance of sun protection wearing sunscreen daily and sun protective clothing.  She was started on a prednisone taper starting at 20 mg tapering by 5 mg every 4 days.  She will continue taking prednisone 5 mg by mouth daily when she completes the taper.  She has no oral ulcerations or nasal ulcerations on exam.  No malar rash was noted.  No palpitations or shortness of breath.  She has not had any recent fevers or increased fatigue.  No sicca symptoms or symptoms of Raynolds.  No digital ulcerations were noted.  She will continue take methotrexate tablets by mouth weekly, folic acid 2 mg daily, Plaquenil 200 mg twice daily Monday through Friday.  She does not need any refills of these medications.  A prescription for prednisone was sent to the pharmacy.  We will recheck her autoimmune labs today.  Plan: ANA,  COMPLETE METABOLIC PANEL WITH GFR, Urinalysis, Routine w reflex microscopic, CBC with Differential/Platelet, Sedimentation rate, C3 and C4, Anti-DNA antibody, double-stranded  High risk medication use - MTX 8 tablets po, folic acid 2 mg daily, PLQ 161 mg BID M-F, prednisone 5 mg  -CBC and CMP are ordered today to monitor for drug toxicity.  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  No digital ulcerations or signs of gangrene were noted.  Rash and other nonspecific skin eruption: She currently has hives on her right upper extremity.  She started having hives on Friday which were on her torso upper and lower extremities.  She is been unable to identify a trigger. we advised her to take Zyrtec as well as given her a prednisone taper starting 20 mg tapering by 5 mg daily.  When she completes the taper she will continue on prednisone 5 mg daily.  Hair loss: She has not had any recent hair loss.  Other decreased white blood cell (WBC) count: CBC and CMP were checked today.  Palpitations: She has not had any recent palpitations.  Other medical conditions are listed as follows:   Vitamin D deficiency  Other insomnia    Orders: Orders Placed This Encounter  Procedures  . ANA  . COMPLETE METABOLIC PANEL WITH GFR  . Urinalysis, Routine w reflex microscopic  . CBC with Differential/Platelet  . Sedimentation rate  . C3 and C4  . Anti-DNA antibody, double-stranded   Meds ordered this encounter  Medications  . predniSONE (DELTASONE) 5 MG tablet    Sig: Take 4 tablets by mouth for 4 days, 3 tablets for 4 days, 2 tablets for 4 days, then stay on 1 tablet daily    Dispense:  54 tablet    Refill:  0    Face-to-face time spent with patient was 30 minutes. >50% of time was spent in counseling and coordination of care.  Follow-Up Instructions: Return for Systemic lupus  erythematosus.   Gearldine Bienenstock, PA-C   I examined and evaluated the patient with Sherron Ales PA. The plan of care was discussed as noted above.  Pollyann Savoy, MD  Note - This record has been created using Animal nutritionist.  Chart creation errors have been sought, but  may not always  have been located. Such creation errors do not reflect on  the standard of medical care.

## 2017-08-26 ENCOUNTER — Telehealth: Payer: Self-pay | Admitting: Rheumatology

## 2017-08-26 MED ORDER — METHOTREXATE 2.5 MG PO TABS: ORAL_TABLET | ORAL | 0 refills | Status: DC

## 2017-08-26 NOTE — Telephone Encounter (Signed)
See previous phone note for conversation with patient.

## 2017-08-26 NOTE — Telephone Encounter (Signed)
Patient called requesting prescription refill of Methotrexate to be called into [facility] on [address] in [place].  Patient states she also has bloating and is asking if there is anything she can take to help with that.

## 2017-08-26 NOTE — Telephone Encounter (Signed)
Last visit: 07/08/2017 Next visit: 09/07/2017 Labs: 07/08/2017   Okay to refill MTX, per Dr. Corliss Skains.   Any recommendations for bloating?

## 2017-08-26 NOTE — Telephone Encounter (Signed)
Returned patient's call and advised patient to take Gas X over the counter for the bloating, per Ladona Ridgel. Patient states she is not experiencing nausea. I advised patient if this issue persisted we could switch her from MTX tablets to injectable, per Ladona Ridgel. The patient will think about that and call our office if she would like to switch.

## 2017-08-26 NOTE — Telephone Encounter (Signed)
Please ask patient if she is experiencing nausea and bloating.  She can try Gas-x if she is just experiencing bloating.  If she is experiencing nausea we can send a prescription for Zofran 4 mg.  If she continue to have symptoms following her PO dose of MTX we can discuss switching her to the injectable form of MTX.

## 2017-08-26 NOTE — Telephone Encounter (Signed)
Patient called stating she was returning a call to the office.   

## 2017-09-01 ENCOUNTER — Telehealth: Payer: Self-pay | Admitting: Rheumatology

## 2017-09-01 MED ORDER — PREDNISONE 2.5 MG PO TABS: 5.0000 mg | ORAL_TABLET | Freq: Every day | ORAL | 0 refills | Status: DC

## 2017-09-01 NOTE — Telephone Encounter (Signed)
She can take ibuprofen occasionally.

## 2017-09-01 NOTE — Telephone Encounter (Signed)
Patient advised she may taken ibuprofen occasionally. Patient verbalized understanding.

## 2017-09-01 NOTE — Telephone Encounter (Signed)
Last visit: 07/08/17 Next visit: 09/07/17  Okay to refill per Dr. Corliss Skainseveshwar  Patient states she is taking Methotrexate and Prednisone, she is asking if it is okay to take Ibuprofen when she has body aches or cramps.

## 2017-09-01 NOTE — Telephone Encounter (Signed)
Patient called requesting prescription refill of Prednisone sent to [facility] in [place].  Patient states she is taking Methotrexate and is asking if it is okay to take Ibuprofen when she has body aches or cramps.

## 2017-09-07 ENCOUNTER — Ambulatory Visit (INDEPENDENT_AMBULATORY_CARE_PROVIDER_SITE_OTHER): Payer: 59 | Admitting: Physician Assistant

## 2017-09-07 ENCOUNTER — Encounter: Payer: Self-pay | Admitting: Physician Assistant

## 2017-09-07 VITALS — BP 120/77 | HR 81 | Ht 62.0 in | Wt 136.0 lb

## 2017-09-07 DIAGNOSIS — D72818 Other decreased white blood cell count: Secondary | ICD-10-CM

## 2017-09-07 DIAGNOSIS — G4709 Other insomnia: Secondary | ICD-10-CM | POA: Diagnosis not present

## 2017-09-07 DIAGNOSIS — R002 Palpitations: Secondary | ICD-10-CM

## 2017-09-07 DIAGNOSIS — E559 Vitamin D deficiency, unspecified: Secondary | ICD-10-CM | POA: Diagnosis not present

## 2017-09-07 DIAGNOSIS — I7300 Raynaud's syndrome without gangrene: Secondary | ICD-10-CM | POA: Diagnosis not present

## 2017-09-07 DIAGNOSIS — M3219 Other organ or system involvement in systemic lupus erythematosus: Secondary | ICD-10-CM | POA: Diagnosis not present

## 2017-09-07 DIAGNOSIS — R21 Rash and other nonspecific skin eruption: Secondary | ICD-10-CM

## 2017-09-07 DIAGNOSIS — L659 Nonscarring hair loss, unspecified: Secondary | ICD-10-CM | POA: Diagnosis not present

## 2017-09-07 DIAGNOSIS — Z79899 Other long term (current) drug therapy: Secondary | ICD-10-CM

## 2017-09-07 MED ORDER — PREDNISONE 5 MG PO TABS: ORAL_TABLET | ORAL | 0 refills | Status: DC

## 2017-09-07 NOTE — Patient Instructions (Addendum)
Standing Labs We placed an order today for your standing lab work.    Please come back and get your standing labs in September and every 3 months   We have open lab Monday through Friday from 8:30-11:30 AM and 1:30-4:00 PM  at the office of Dr. Pollyann SavoyShaili Deveshwar.   You may experience shorter wait times on Monday and Friday afternoons. The office is located at [address], [address], [place], [place] [address] No appointment is necessary.   Labs are drawn by [facility].  You may receive a bill from [facility] for your lab work. If you have any questions regarding directions or hours of operation,  please call [phone].      Prednisone taper: Take 4 tablets by mouth for 4 days, 3 tablets for 4 days, 2 tablets for 4 days then take 1 tablet daily. Call the office for a refill of Prednisone 5 mg tablets once you are almost done with the taper  Take Zyrtec daily  You can continue using topical anti-itch cream.  Wear sunscreen and sun-protective clothing daily

## 2017-09-09 LAB — COMPLETE METABOLIC PANEL WITH GFR
AG Ratio: 1.5 (calc) (ref 1.0–2.5)
ALT: 26 U/L (ref 6–29)
AST: 24 U/L (ref 10–30)
Albumin: 4.7 g/dL (ref 3.6–5.1)
Alkaline phosphatase (APISO): 46 U/L (ref 33–115)
BUN: 13 mg/dL (ref 7–25)
CALCIUM: 9.6 mg/dL (ref 8.6–10.2)
CO2: 28 mmol/L (ref 20–32)
Chloride: 104 mmol/L (ref 98–110)
Creat: 0.67 mg/dL (ref 0.50–1.10)
GFR, EST AFRICAN AMERICAN: 146 mL/min/{1.73_m2} (ref >=60)
GFR, EST NON AFRICAN AMERICAN: 126 mL/min/{1.73_m2} (ref >=60)
GLOBULIN: 3.1 g/dL (ref 1.9–3.7)
Glucose, Bld: 81 mg/dL (ref 65–99)
POTASSIUM: 3.8 mmol/L (ref 3.5–5.3)
SODIUM: 139 mmol/L (ref 135–146)
TOTAL PROTEIN: 7.8 g/dL (ref 6.1–8.1)
Total Bilirubin: 0.3 mg/dL (ref 0.2–1.2)

## 2017-09-09 LAB — ANTI-NUCLEAR AB-TITER (ANA TITER): ANA Titer 1: 1:320 {titer} — ABNORMAL HIGH

## 2017-09-09 LAB — URINALYSIS, ROUTINE W REFLEX MICROSCOPIC
BILIRUBIN URINE: NEGATIVE
Bacteria, UA: NONE SEEN /HPF
Glucose, UA: NEGATIVE
HYALINE CAST: NONE SEEN /LPF
Hgb urine dipstick: NEGATIVE
Ketones, ur: NEGATIVE
NITRITE: NEGATIVE
PH: 7.5 (ref 5.0–8.0)
PROTEIN: NEGATIVE
RBC / HPF: NONE SEEN /HPF (ref 0–2)
SPECIFIC GRAVITY, URINE: 1.016 (ref 1.001–1.03)

## 2017-09-09 LAB — CBC WITH DIFFERENTIAL/PLATELET
BASOS ABS: 11 {cells}/uL (ref 0–200)
Basophils Relative: 0.3 %
EOS ABS: 19 {cells}/uL (ref 15–500)
Eosinophils Relative: 0.5 %
HCT: 37.1 % (ref 35.0–45.0)
HEMOGLOBIN: 12.8 g/dL (ref 11.7–15.5)
Lymphs Abs: 1060 cells/uL (ref 850–3900)
MCH: 31.2 pg (ref 27.0–33.0)
MCHC: 34.5 g/dL (ref 32.0–36.0)
MCV: 90.5 fL (ref 80.0–100.0)
MPV: 9.8 fL (ref 7.5–12.5)
Monocytes Relative: 9.8 %
Neutro Abs: 2337 cells/uL (ref 1500–7800)
Neutrophils Relative %: 61.5 %
Platelets: 261 10*3/uL (ref 140–400)
RBC: 4.1 10*6/uL (ref 3.80–5.10)
RDW: 13.4 % (ref 11.0–15.0)
TOTAL LYMPHOCYTE: 27.9 %
WBC mixed population: 372 cells/uL (ref 200–950)
WBC: 3.8 10*3/uL (ref 3.8–10.8)

## 2017-09-09 LAB — C3 AND C4
C3 Complement: 67 mg/dL — ABNORMAL LOW (ref 83–193)
C4 COMPLEMENT: 8 mg/dL — AB (ref 15–57)

## 2017-09-09 LAB — ANA: Anti Nuclear Antibody(ANA): POSITIVE — AB

## 2017-09-09 LAB — ANTI-DNA ANTIBODY, DOUBLE-STRANDED: ds DNA Ab: 2 IU/mL

## 2017-09-09 LAB — SEDIMENTATION RATE: Sed Rate: 14 mm/h (ref 0–20)

## 2017-09-10 NOTE — Progress Notes (Signed)
stable °

## 2017-09-14 ENCOUNTER — Other Ambulatory Visit: Payer: Self-pay | Admitting: Rheumatology

## 2017-09-14 NOTE — Telephone Encounter (Addendum)
Last Visit: 09/07/17 Next visit: 11/10/17 Labs: 09/07/17 stable PLQ Eye Exam: 08/15/16   Left message to advise patient we are needing an update PLQ eye exam   Okay to refill per Dr.Deveshwar

## 2017-10-02 ENCOUNTER — Telehealth: Payer: Self-pay | Admitting: Rheumatology

## 2017-10-02 MED ORDER — PREDNISONE 5 MG PO TABS: 5.0000 mg | ORAL_TABLET | Freq: Every day | ORAL | 1 refills | Status: DC

## 2017-10-02 NOTE — Telephone Encounter (Signed)
Patient called requesting prescription refill of Prednisone 5 mg to be sent to [facility] [address] [place].

## 2017-10-02 NOTE — Telephone Encounter (Signed)
Last Visit: 09/07/17 Next visit: 11/10/17  Okay to refill per Dr.Deveshwar

## 2017-10-16 ENCOUNTER — Telehealth: Payer: Self-pay | Admitting: Rheumatology

## 2017-10-16 MED ORDER — HYDROXYCHLOROQUINE SULFATE 200 MG PO TABS: ORAL_TABLET | ORAL | 0 refills | Status: DC

## 2017-10-16 NOTE — Telephone Encounter (Signed)
Patient called stating she had her Plaquenil eye exam at [facility] today.  Patient states they will be faxing over the results today so she can get a refill of her Plaquenil.

## 2017-10-16 NOTE — Telephone Encounter (Signed)
Returned patient's call and advised patient that a refill was sent in on 09/14/2017 to [facility]. Patient states she had not received anything from them but has had enough meds at home to last until now. Patient is requesting a 30 day supply be sent to the [facility] on [address] in [place].

## 2017-10-21 ENCOUNTER — Telehealth: Payer: Self-pay | Admitting: Rheumatology

## 2017-10-21 MED ORDER — FOLIC ACID 1 MG PO TABS: 2.0000 mg | ORAL_TABLET | Freq: Every day | ORAL | 3 refills | Status: DC

## 2017-10-21 MED ORDER — METHOTREXATE 2.5 MG PO TABS: ORAL_TABLET | ORAL | 0 refills | Status: DC

## 2017-10-21 NOTE — Telephone Encounter (Signed)
Last Visit: 09/07/17 Next visit: 11/10/17 Labs: 09/07/17 stable  Okay to refill per Dr.Deveshwar

## 2017-10-21 NOTE — Telephone Encounter (Signed)
Patient called requesting prescription refill of Methotrexate and Folic Acid to be sent to [facility] [address] in [place].

## 2017-10-27 NOTE — Progress Notes (Signed)
Office Visit Note  Patient: Brittany Preston             Date of Birth: 23-Mar-1996           MRN: [id number]             PCP: Verlon AuBoyd, Tammy Lamonica, MD Referring: Verlon AuBoyd, Tammy Lamonica, MD Visit Date: 11/10/2017 Occupation: @GUAROCC @  Subjective:  Right knee pain   History of Present Illness: Brittany Preston is a 22 y.o. female with history of systemic lupus erythematosus.  Patient is on MTX 8 tablets, folic acid 2 mg po daily, PLQ 200 mg po BID M-F.  She continues to take prednisone 5 mg daily.  Patient denies any recent signs or symptoms of a flare.  She denies any recent rashes.  Denies any sores in her mouth or nose.  She has not had any symptoms of Raynolds.  She denies any palpitations, shortness of breath, or chest pain.  She reports that she has been tolerating her medications well and has not missed any doses recently.  She denies any fatigue or low-grade fevers recently.  She states that she recently went on vacation to [place] and wear sunscreen and reapply every 2 hours.  She states that on occasion she was walking a lot more than normal and developed mild right knee pain.  She denies any joint swelling.  She has any other joint pain at this time.   Activities of Daily Living:  Patient reports morning stiffness for 0 none.   Patient Denies nocturnal pain.  Difficulty dressing/grooming: Denies Difficulty climbing stairs: Denies Difficulty getting out of chair: Denies Difficulty using hands for taps, buttons, cutlery, and/or writing: Denies  Review of Systems  Constitutional: Negative for fatigue.  HENT: Negative for mouth sores, mouth dryness and nose dryness.   Eyes: Negative for pain, visual disturbance and dryness.  Respiratory: Negative for cough, hemoptysis, shortness of breath and difficulty breathing.   Cardiovascular: Negative for chest pain, palpitations, hypertension and swelling in legs/feet.  Gastrointestinal: Positive for constipation. Negative for blood in stool and  diarrhea.  Endocrine: Negative for increased urination.  Genitourinary: Negative for difficulty urinating and painful urination.  Musculoskeletal: Positive for arthralgias and joint pain. Negative for joint swelling, myalgias, muscle weakness, morning stiffness, muscle tenderness and myalgias.  Skin: Negative for color change, pallor, rash, hair loss, nodules/bumps, skin tightness, ulcers and sensitivity to sunlight.  Allergic/Immunologic: Negative for susceptible to infections.  Neurological: Negative for dizziness, numbness, headaches and weakness.  Hematological: Negative for bruising/bleeding tendency and swollen glands.  Psychiatric/Behavioral: Negative for depressed mood and sleep disturbance. The patient is not nervous/anxious.     PMFS History:  Patient Active Problem List   Diagnosis Date Noted  . Systemic lupus erythematosus (HCC) 11/10/2016  . Vitamin D deficiency 11/06/2016  . Hair loss 10/14/2016  . Leukopenia 10/14/2016  . High risk medication use 10/14/2016  . Moderate persistent asthma without complication 12/10/2015    Past Medical History:  Diagnosis Date  . Asthma   . Lupus (HCC) 06/2016    History reviewed. No pertinent family history. History reviewed. No pertinent surgical history. Social History   Social History Narrative  . Not on file    Objective: Vital Signs: BP 115/74 (BP Location: Left Arm, Patient Position: Sitting, Cuff Size: Normal)   Pulse 92   Resp 14   Ht 5\' 2"  (1.575 m)   Wt 141 lb (64 kg)   LMP 10/23/2017   BMI 25.79 kg/m  Physical Exam  Constitutional: She is oriented to person, place, and time. She appears well-developed and well-nourished.  HENT:  Head: Normocephalic and atraumatic.  No oral or nasal ulcerations.  No parotid swelling.   Eyes: Conjunctivae and EOM are normal.  Neck: Normal range of motion.  Cardiovascular: Normal rate, regular rhythm, normal heart sounds and intact distal pulses.  Pulmonary/Chest: Effort  normal and breath sounds normal.  Abdominal: Soft. Bowel sounds are normal.  Lymphadenopathy:    She has no cervical adenopathy.  Neurological: She is alert and oriented to person, place, and time.  Skin: Skin is warm and dry. Capillary refill takes less than 2 seconds.  Fingertips are cool and mild blue tint noted.  Psychiatric: She has a normal mood and affect. Her behavior is normal.  Nursing note and vitals reviewed.    Musculoskeletal Exam: C-spine, thoracic spine, and lumbar spine good ROM. No midline spinal tenderness.  No SI joint tenderness.  Shoulder joints, elbow joints, wrist joints, MCPs, PIPs, and DIPs good ROM with no synovitis. Hip joints, knee joints, ankle joints, MTPs, PIPs, and DIPs good ROM with no synovitis. Right knee crepitus. No warmth or effusion of knee joints.  No tenderness of trochanteric bursa bilaterally.   CDAI Exam: No CDAI exam completed.   Investigation: No additional findings.  Imaging: No results found.  Recent Labs: Lab Results  Component Value Date   WBC 3.8 09/07/2017   HGB 12.8 09/07/2017   PLT 261 09/07/2017   NA 139 09/07/2017   K 3.8 09/07/2017   CL 104 09/07/2017   CO2 28 09/07/2017   GLUCOSE 81 09/07/2017   BUN 13 09/07/2017   CREATININE 0.67 09/07/2017   BILITOT 0.3 09/07/2017   ALKPHOS 34 11/13/2016   AST 24 09/07/2017   ALT 26 09/07/2017   PROT 7.8 09/07/2017   ALBUMIN 4.3 11/13/2016   CALCIUM 9.6 09/07/2017   GFRAA 146 09/07/2017    Speciality Comments: Baseline PLQ Eye Exam: 08/15/16 WNL follow up in 1 year  Procedures:  No procedures performed Allergies: Patient has no known allergies.   Assessment / Plan:     Visit Diagnoses: Other systemic lupus erythematosus with other organ involvement (HCC) -  History of fatigue, weight loss, hair loss, malar rash, Raynauds, skin lupus, neutropenia, elevated sedimentation rate, positive ANA,ds DNA, Sm, Ro, low C3: She has not had any signs or symptoms of a flare recently.   She has no synovitis on exam.  She recently had right knee pain while on vacation since she was doing prolonged walking.  No malar rash or lesions on her skin were noted.  She has been wearing sunscreen and reapplying every 2 hours.  No oral or nasal ulcerations were noted on exam.  She denies any recent hair loss.  Her fingertips are mildly blue and cold today.  No digital ulcerations were noted.  She denies any recent weight loss, fatigue, or low-grade fevers.  She has not had any palpitations shortness of breath or or chest pain recently. She is clinically doing well on Plaquenil 200 mg twice daily Monday through Friday, methotrexate 8 tablets by mouth once weekly, folic acid 2 mg daily.  She continues to take prednisone 5 mg by mouth daily.  We will begin tapering prednisone by 1 mg every month.  Prescription for prednisone sent to the pharmacy today.  A refill of methotrexate was also sent.  She was advised to notify us if she develops any signs or symptoms of a flare  while tapering the prednisone.  She will follow-up in the office in 4 months.  High risk medication use - MTX 8 tablets po, folic acid 2 mg daily, PLQ 161 mg BID M-F, prednisone 5 mg.  CBC and CMP are within normal limits on 09/07/2017.  She will return in September and every 3 months for lab work.  We will check autoimmune labs with her next labs.  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  No digital ulcerations or signs of gangrene were noted.  Hair loss: She has not had any recent hair loss.   Palpitations: She has not experienced any palpitations recently.   Vitamin D deficiency: She takes vitamin D 2,000 units daily.   Other medical conditions are listed as follows:   Other insomnia  History of asthma   Orders: No orders of the defined types were placed in this encounter.  Meds ordered this encounter  Medications  . methotrexate (RHEUMATREX) 2.5 MG tablet    Sig: Caution:Chemotherapy.  Protect from light. Take 8 tablets by mouth once weekly.    Dispense:  96 tablet    Refill:  0  . predniSONE (DELTASONE) 1 MG tablet    Sig: Take 4 tablets by mouth daily for 1 month, 3 tabs po daily x1 month, 2 tabs po daily x1 month, 1 tab po daily x1 month    Dispense:  300 tablet    Refill:  0    Face-to-face time spent with patient was 30 minutes. Greater than 50% of time was spent in counseling and coordination of care.  Follow-Up Instructions: Return in about 4 months (around 03/12/2018) for Systemic lupus erythematosus.   Gearldine Bienenstock, PA-C  Note - This record has been created using Dragon software.  Chart creation errors have been sought, but may not always  have been located. Such creation errors do not reflect on  the standard of medical care.

## 2017-11-10 ENCOUNTER — Encounter: Payer: Self-pay | Admitting: Physician Assistant

## 2017-11-10 ENCOUNTER — Ambulatory Visit: Payer: 59 | Admitting: Physician Assistant

## 2017-11-10 VITALS — BP 115/74 | HR 92 | Resp 14 | Ht 62.0 in | Wt 141.0 lb

## 2017-11-10 DIAGNOSIS — M3219 Other organ or system involvement in systemic lupus erythematosus: Secondary | ICD-10-CM

## 2017-11-10 DIAGNOSIS — I7300 Raynaud's syndrome without gangrene: Secondary | ICD-10-CM | POA: Diagnosis not present

## 2017-11-10 DIAGNOSIS — Z79899 Other long term (current) drug therapy: Secondary | ICD-10-CM

## 2017-11-10 DIAGNOSIS — L659 Nonscarring hair loss, unspecified: Secondary | ICD-10-CM

## 2017-11-10 DIAGNOSIS — E559 Vitamin D deficiency, unspecified: Secondary | ICD-10-CM

## 2017-11-10 DIAGNOSIS — R002 Palpitations: Secondary | ICD-10-CM

## 2017-11-10 DIAGNOSIS — Z8709 Personal history of other diseases of the respiratory system: Secondary | ICD-10-CM

## 2017-11-10 DIAGNOSIS — G4709 Other insomnia: Secondary | ICD-10-CM

## 2017-11-10 MED ORDER — METHOTREXATE 2.5 MG PO TABS: ORAL_TABLET | ORAL | 0 refills | Status: DC

## 2017-11-10 MED ORDER — PREDNISONE 1 MG PO TABS: ORAL_TABLET | ORAL | 0 refills | Status: DC

## 2017-11-10 NOTE — Patient Instructions (Addendum)
Taper prednisone by 1 mg every month  Please notify us if you develop any signs or symptoms of a flare    Knee Exercises Ask your health care provider which exercises are safe for you. Do exercises exactly as told by your health care provider and adjust them as directed. It is normal to feel mild stretching, pulling, tightness, or discomfort as you do these exercises, but you should stop right away if you feel sudden pain or your pain gets worse.Do not begin these exercises until told by your health care provider. STRETCHING AND RANGE OF MOTION EXERCISES These exercises warm up your muscles and joints and improve the movement and flexibility of your knee. These exercises also help to relieve pain, numbness, and tingling. Exercise A: Knee Extension, Prone 1. Lie on your abdomen on a bed. 2. Place your left / right knee just beyond the edge of the surface so your knee is not on the bed. You can put a towel under your left / right thigh just above your knee for comfort. 3. Relax your leg muscles and allow gravity to straighten your knee. You should feel a stretch behind your left / right knee. 4. Hold this position for __________ seconds. 5. Scoot up so your knee is supported between repetitions. Repeat __________ times. Complete this stretch __________ times a day. Exercise B: Knee Flexion, Active  1. Lie on your back with both knees straight. If this causes back discomfort, bend your left / right knee so your foot is flat on the floor. 2. Slowly slide your left / right heel back toward your buttocks until you feel a gentle stretch in the front of your knee or thigh. 3. Hold this position for __________ seconds. 4. Slowly slide your left / right heel back to the starting position. Repeat __________ times. Complete this exercise __________ times a day. Exercise C: Quadriceps, Prone  1. Lie on your abdomen on a firm surface, such as a bed or padded floor. 2. Bend your left / right knee and hold  your ankle. If you cannot reach your ankle or pant leg, loop a belt around your foot and grab the belt instead. 3. Gently pull your heel toward your buttocks. Your knee should not slide out to the side. You should feel a stretch in the front of your thigh and knee. 4. Hold this position for __________ seconds. Repeat __________ times. Complete this stretch __________ times a day. Exercise D: Hamstring, Supine 1. Lie on your back. 2. Loop a belt or towel over the ball of your left / right foot. The ball of your foot is on the walking surface, right under your toes. 3. Straighten your left / right knee and slowly pull on the belt to raise your leg until you feel a gentle stretch behind your knee. ? Do not let your left / right knee bend while you do this. ? Keep your other leg flat on the floor. 4. Hold this position for __________ seconds. Repeat __________ times. Complete this stretch __________ times a day. STRENGTHENING EXERCISES These exercises build strength and endurance in your knee. Endurance is the ability to use your muscles for a long time, even after they get tired. Exercise E: Quadriceps, Isometric  1. Lie on your back with your left / right leg extended and your other knee bent. Put a rolled towel or small pillow under your knee if told by your health care provider. 2. Slowly tense the muscles in the front of  your left / right thigh. You should see your kneecap slide up toward your hip or see increased dimpling just above the knee. This motion will push the back of the knee toward the floor. 3. For __________ seconds, keep the muscle as tight as you can without increasing your pain. 4. Relax the muscles slowly and completely. Repeat __________ times. Complete this exercise __________ times a day. Exercise F: Straight Leg Raises - Quadriceps 1. Lie on your back with your left / right leg extended and your other knee bent. 2. Tense the muscles in the front of your left / right  thigh. You should see your kneecap slide up or see increased dimpling just above the knee. Your thigh may even shake a bit. 3. Keep these muscles tight as you raise your leg 4-6 inches (10-15 cm) off the floor. Do not let your knee bend. 4. Hold this position for __________ seconds. 5. Keep these muscles tense as you lower your leg. 6. Relax your muscles slowly and completely after each repetition. Repeat __________ times. Complete this exercise __________ times a day. Exercise G: Hamstring, Isometric 1. Lie on your back on a firm surface. 2. Bend your left / right knee approximately __________ degrees. 3. Dig your left / right heel into the surface as if you are trying to pull it toward your buttocks. Tighten the muscles in the back of your thighs to dig as hard as you can without increasing any pain. 4. Hold this position for __________ seconds. 5. Release the tension gradually and allow your muscles to relax completely for __________ seconds after each repetition. Repeat __________ times. Complete this exercise __________ times a day. Exercise H: Hamstring Curls  If told by your health care provider, do this exercise while wearing ankle weights. Begin with __________ weights. Then increase the weight by 1 lb (0.5 kg) increments. Do not wear ankle weights that are more than __________. 1. Lie on your abdomen with your legs straight. 2. Bend your left / right knee as far as you can without feeling pain. Keep your hips flat against the floor. 3. Hold this position for __________ seconds. 4. Slowly lower your leg to the starting position.  Repeat __________ times. Complete this exercise __________ times a day. Exercise I: Squats (Quadriceps) 1. Stand in front of a table, with your feet and knees pointing straight ahead. You may rest your hands on the table for balance but not for support. 2. Slowly bend your knees and lower your hips like you are going to sit in a chair. ? Keep your weight  over your heels, not over your toes. ? Keep your lower legs upright so they are parallel with the table legs. ? Do not let your hips go lower than your knees. ? Do not bend lower than told by your health care provider. ? If your knee pain increases, do not bend as low. 3. Hold the squat position for __________ seconds. 4. Slowly push with your legs to return to standing. Do not use your hands to pull yourself to standing. Repeat __________ times. Complete this exercise __________ times a day. Exercise J: Wall Slides (Quadriceps)  1. Lean your back against a smooth wall or door while you walk your feet out 18-24 inches (46-61 cm) from it. 2. Place your feet hip-width apart. 3. Slowly slide down the wall or door until your knees bend __________ degrees. Keep your knees over your heels, not over your toes. Keep your knees in line  with your hips. 4. Hold for __________ seconds. Repeat __________ times. Complete this exercise __________ times a day. Exercise K: Straight Leg Raises - Hip Abductors 1. Lie on your side with your left / right leg in the top position. Lie so your head, shoulder, knee, and hip line up. You may bend your bottom knee to help you keep your balance. 2. Roll your hips slightly forward so your hips are stacked directly over each other and your left / right knee is facing forward. 3. Leading with your heel, lift your top leg 4-6 inches (10-15 cm). You should feel the muscles in your outer hip lifting. ? Do not let your foot drift forward. ? Do not let your knee roll toward the ceiling. 4. Hold this position for __________ seconds. 5. Slowly return your leg to the starting position. 6. Let your muscles relax completely after each repetition. Repeat __________ times. Complete this exercise __________ times a day. Exercise L: Straight Leg Raises - Hip Extensors 1. Lie on your abdomen on a firm surface. You can put a pillow under your hips if that is more comfortable. 2. Tense  the muscles in your buttocks and lift your left / right leg about 4-6 inches (10-15 cm). Keep your knee straight as you lift your leg. 3. Hold this position for __________ seconds. 4. Slowly lower your leg to the starting position. 5. Let your leg relax completely after each repetition. Repeat __________ times. Complete this exercise __________ times a day. This information is not intended to replace advice given to you by your health care provider. Make sure you discuss any questions you have with your health care provider. Document Released: 01/29/2005 Document Revised: 12/10/2015 Document Reviewed: 01/21/2015 Elsevier Interactive Patient Education  2018 [employer]   [facility] We placed an order today for your standing lab work.    Please come back and get your standing labs in September and every 3 months    We have open lab Monday through Friday from 8:30-11:30 AM and 1:30-4:00 PM  at the office of Dr. Pollyann SavoyShaili Deveshwar.   You may experience shorter wait times on Monday and Friday afternoons. The office is located at [address], [address], [place], [place] [address] No appointment is necessary.   Labs are drawn by [facility].  You may receive a bill from [facility] for your lab work. If you have any questions regarding directions or hours of operation,  please call [phone].

## 2017-12-11 ENCOUNTER — Other Ambulatory Visit: Payer: Self-pay

## 2017-12-11 ENCOUNTER — Telehealth: Payer: Self-pay | Admitting: Rheumatology

## 2017-12-11 DIAGNOSIS — Z79899 Other long term (current) drug therapy: Secondary | ICD-10-CM

## 2017-12-11 NOTE — Telephone Encounter (Signed)
I wrote a prescription for a standing desk.  I called the patient to notify it will be ready for her at the front desk.

## 2017-12-11 NOTE — Telephone Encounter (Signed)
Patient called requesting a note for a standing desk for work.

## 2017-12-12 LAB — CBC WITH DIFFERENTIAL/PLATELET
BASOS ABS: 21 {cells}/uL (ref 0–200)
Basophils Relative: 0.5 %
Eosinophils Absolute: 38 cells/uL (ref 15–500)
Eosinophils Relative: 0.9 %
HEMATOCRIT: 40.4 % (ref 35.0–45.0)
Hemoglobin: 13.5 g/dL (ref 11.7–15.5)
LYMPHS ABS: 1084 {cells}/uL (ref 850–3900)
MCH: 31.4 pg (ref 27.0–33.0)
MCHC: 33.4 g/dL (ref 32.0–36.0)
MCV: 94 fL (ref 80.0–100.0)
MPV: 9.9 fL (ref 7.5–12.5)
Monocytes Relative: 18.2 %
NEUTROS PCT: 54.6 %
Neutro Abs: 2293 cells/uL (ref 1500–7800)
Platelets: 243 10*3/uL (ref 140–400)
RBC: 4.3 10*6/uL (ref 3.80–5.10)
RDW: 13 % (ref 11.0–15.0)
Total Lymphocyte: 25.8 %
WBC: 4.2 10*3/uL (ref 3.8–10.8)
WBCMIX: 764 {cells}/uL (ref 200–950)

## 2017-12-12 LAB — COMPLETE METABOLIC PANEL WITH GFR
AG Ratio: 1.3 (calc) (ref 1.0–2.5)
ALBUMIN MSPROF: 4.6 g/dL (ref 3.6–5.1)
ALKALINE PHOSPHATASE (APISO): 47 U/L (ref 33–115)
ALT: 15 U/L (ref 6–29)
AST: 19 U/L (ref 10–30)
BUN: 12 mg/dL (ref 7–25)
CALCIUM: 9.7 mg/dL (ref 8.6–10.2)
CO2: 28 mmol/L (ref 20–32)
CREATININE: 0.89 mg/dL (ref 0.50–1.10)
Chloride: 104 mmol/L (ref 98–110)
GFR, EST NON AFRICAN AMERICAN: 93 mL/min/{1.73_m2} (ref >=60)
GFR, Est African American: 107 mL/min/{1.73_m2} (ref >=60)
GLUCOSE: 73 mg/dL (ref 65–99)
Globulin: 3.5 g/dL (calc) (ref 1.9–3.7)
Potassium: 4 mmol/L (ref 3.5–5.3)
Sodium: 140 mmol/L (ref 135–146)
TOTAL PROTEIN: 8.1 g/dL (ref 6.1–8.1)
Total Bilirubin: 0.2 mg/dL (ref 0.2–1.2)

## 2017-12-19 ENCOUNTER — Other Ambulatory Visit: Payer: Self-pay | Admitting: Rheumatology

## 2017-12-21 NOTE — Telephone Encounter (Signed)
Last Visit: 11/10/17 Next Visit: 03/11/18 Labs: 12/11/17 WNL   PLQ Eye Exam: 08/15/16 WNL  Okay to refill per Dr. Corliss Skainseveshwar

## 2017-12-29 ENCOUNTER — Telehealth: Payer: Self-pay | Admitting: Rheumatology

## 2017-12-29 NOTE — Telephone Encounter (Signed)
Patient left a voicemail checking to see if she can get a flu shot while taking her medication.  Patient requested a return call.

## 2017-12-29 NOTE — Telephone Encounter (Signed)
Patient advised it is highly recommended that she get the flu vaccination. Patient advised she will not need to hold her medication. Patient verbalized understanding.

## 2018-01-11 ENCOUNTER — Telehealth: Payer: Self-pay | Admitting: Rheumatology

## 2018-01-11 ENCOUNTER — Other Ambulatory Visit: Payer: Self-pay

## 2018-01-11 DIAGNOSIS — Z79899 Other long term (current) drug therapy: Secondary | ICD-10-CM

## 2018-01-11 DIAGNOSIS — M3219 Other organ or system involvement in systemic lupus erythematosus: Secondary | ICD-10-CM

## 2018-01-11 MED ORDER — METHOTREXATE 2.5 MG PO TABS: ORAL_TABLET | ORAL | 2 refills | Status: DC

## 2018-01-11 NOTE — Telephone Encounter (Signed)
Patient advised when in office for labs to follow up with PCP. MTX prescription sent to the pharmacy.

## 2018-01-11 NOTE — Telephone Encounter (Signed)
Lab orders have been placed. Will advise patient to see PCP when she comes to office for labs.

## 2018-01-11 NOTE — Telephone Encounter (Signed)
Attempted to contact the patient and left message for patient to call the office.  

## 2018-01-11 NOTE — Telephone Encounter (Signed)
Patient called requesting prescription refill of Methotrexate to be sent to [facility] on [address] in [place].  Patient states the pharmacy told her she needs a prior authorization to refill the prescription.  Patient states that she is only able to get 32 pills because of her insurance and she only had 5 pills on Saturday instead of the 8 that she was due to take.  Patient states she is also having back pain, as well as the lymph nodes on the left side of her neck are painful with movement and with swallowing.

## 2018-01-11 NOTE — Telephone Encounter (Signed)
Per Sherron Ales, PA-C patient advised to come to the office for labs to check fr a lupus flare. Patient advised if she begins to have symptoms such as fever or sore throat she should follow up with PCP as well. Patient verbalized understanding.

## 2018-01-11 NOTE — Telephone Encounter (Signed)
Patient states her insurance will only cover a 30 day supply of her MTX.Patient states staets she was only left with 5 pills at the end of her prescription this month. Advised patient she may want to check with pharmacy as she should have enough to last for 4 weeks. Patient states she is having swollen lymph nodes of left side of her neck. Patient states it hurts with movement and with swallowing. Patient states she has had a cough on and off for the last couple of days as well as some body aches, lower back and shoulders. Patient states she recently had her flu shot last week. Patient denies any fever or sore throat.

## 2018-01-11 NOTE — Telephone Encounter (Signed)
Please pend sed rate, CBC, CMP, Urine analysis, dsDNA, and complements.  Please advise patient to follow up with PCP as well.

## 2018-01-12 LAB — URINALYSIS, ROUTINE W REFLEX MICROSCOPIC
BACTERIA UA: NONE SEEN /HPF
Bilirubin Urine: NEGATIVE
Glucose, UA: NEGATIVE
HYALINE CAST: NONE SEEN /LPF
Hgb urine dipstick: NEGATIVE
KETONES UR: NEGATIVE
Nitrite: NEGATIVE
PH: 7.5 (ref 5.0–8.0)
Protein, ur: NEGATIVE
RBC / HPF: NONE SEEN /HPF (ref 0–2)
SPECIFIC GRAVITY, URINE: 1.007 (ref 1.001–1.03)

## 2018-01-12 LAB — COMPLETE METABOLIC PANEL WITH GFR
AG Ratio: 1.2 (calc) (ref 1.0–2.5)
ALT: 21 U/L (ref 6–29)
AST: 22 U/L (ref 10–30)
Albumin: 4.5 g/dL (ref 3.6–5.1)
Alkaline phosphatase (APISO): 44 U/L (ref 33–115)
BUN: 9 mg/dL (ref 7–25)
CALCIUM: 9.7 mg/dL (ref 8.6–10.2)
CO2: 28 mmol/L (ref 20–32)
CREATININE: 0.71 mg/dL (ref 0.50–1.10)
Chloride: 103 mmol/L (ref 98–110)
GFR, EST NON AFRICAN AMERICAN: 121 mL/min/{1.73_m2} (ref >=60)
GFR, Est African American: 140 mL/min/{1.73_m2} (ref >=60)
GLOBULIN: 3.7 g/dL (ref 1.9–3.7)
Glucose, Bld: 70 mg/dL (ref 65–99)
Potassium: 3.5 mmol/L (ref 3.5–5.3)
SODIUM: 138 mmol/L (ref 135–146)
Total Bilirubin: 0.4 mg/dL (ref 0.2–1.2)
Total Protein: 8.2 g/dL — ABNORMAL HIGH (ref 6.1–8.1)

## 2018-01-12 LAB — CBC WITH DIFFERENTIAL/PLATELET
BASOS PCT: 0 %
Basophils Absolute: 0 cells/uL (ref 0–200)
EOS PCT: 0.6 %
Eosinophils Absolute: 21 cells/uL (ref 15–500)
HCT: 38.3 % (ref 35.0–45.0)
Hemoglobin: 13 g/dL (ref 11.7–15.5)
LYMPHS ABS: 1036 {cells}/uL (ref 850–3900)
MCH: 31.7 pg (ref 27.0–33.0)
MCHC: 33.9 g/dL (ref 32.0–36.0)
MCV: 93.4 fL (ref 80.0–100.0)
MPV: 9.8 fL (ref 7.5–12.5)
Monocytes Relative: 14.5 %
NEUTROS PCT: 55.3 %
Neutro Abs: 1936 cells/uL (ref 1500–7800)
PLATELETS: 265 10*3/uL (ref 140–400)
RBC: 4.1 10*6/uL (ref 3.80–5.10)
RDW: 13.1 % (ref 11.0–15.0)
TOTAL LYMPHOCYTE: 29.6 %
WBC: 3.5 10*3/uL — ABNORMAL LOW (ref 3.8–10.8)
WBCMIX: 508 {cells}/uL (ref 200–950)

## 2018-01-12 LAB — ANTI-DNA ANTIBODY, DOUBLE-STRANDED: DS DNA AB: 2 [IU]/mL

## 2018-01-12 LAB — C3 AND C4
C3 Complement: 95 mg/dL (ref 83–193)
C4 COMPLEMENT: 18 mg/dL (ref 15–57)

## 2018-01-12 LAB — SEDIMENTATION RATE: Sed Rate: 19 mm/h (ref 0–20)

## 2018-01-12 NOTE — Progress Notes (Signed)
Sed rate WNL. Complements WNL and dsDNA negative.  UA consistent with UTI.  Please advise patient to follow up with PCP.  WBC count mildly low.  Please advise patient to return in 1 month to recheck CBC.  Labs are not consistent with a lupus flare.  Please advise patient to notify us within any new or worsening symptoms and to still follow up with PCP for further evaluation.

## 2018-02-09 ENCOUNTER — Telehealth: Payer: Self-pay | Admitting: Rheumatology

## 2018-02-09 DIAGNOSIS — Z79899 Other long term (current) drug therapy: Secondary | ICD-10-CM

## 2018-02-09 MED ORDER — PREDNISONE 1 MG PO TABS: 2.0000 mg | ORAL_TABLET | Freq: Every day | ORAL | 0 refills | Status: DC

## 2018-02-09 NOTE — Telephone Encounter (Signed)
Patient left a voicemail requesting a return call to discuss her medication.   

## 2018-02-09 NOTE — Telephone Encounter (Signed)
Patient states she needs a refill on Prednisone. Patient is currently on 2 mg daily.   Last Visit: 11/10/17 Next Visit: 03/11/18  Okay to refill per Dr. Corliss Skains

## 2018-02-10 ENCOUNTER — Other Ambulatory Visit: Payer: Self-pay

## 2018-02-10 DIAGNOSIS — Z79899 Other long term (current) drug therapy: Secondary | ICD-10-CM

## 2018-02-10 LAB — CBC WITH DIFFERENTIAL/PLATELET
BASOS ABS: 11 {cells}/uL (ref 0–200)
Basophils Relative: 0.3 %
Eosinophils Absolute: 30 cells/uL (ref 15–500)
Eosinophils Relative: 0.8 %
HEMATOCRIT: 38.6 % (ref 35.0–45.0)
HEMOGLOBIN: 12.9 g/dL (ref 11.7–15.5)
LYMPHS ABS: 1006 {cells}/uL (ref 850–3900)
MCH: 32 pg (ref 27.0–33.0)
MCHC: 33.4 g/dL (ref 32.0–36.0)
MCV: 95.8 fL (ref 80.0–100.0)
MPV: 9.5 fL (ref 7.5–12.5)
Monocytes Relative: 16.9 %
NEUTROS ABS: 2028 {cells}/uL (ref 1500–7800)
NEUTROS PCT: 54.8 %
Platelets: 277 10*3/uL (ref 140–400)
RBC: 4.03 10*6/uL (ref 3.80–5.10)
RDW: 13.2 % (ref 11.0–15.0)
Total Lymphocyte: 27.2 %
WBC: 3.7 10*3/uL — ABNORMAL LOW (ref 3.8–10.8)
WBCMIX: 625 {cells}/uL (ref 200–950)

## 2018-03-01 NOTE — Progress Notes (Signed)
Office Visit Note  Patient: Brittany Preston             Date of Birth: [birth year]           MRN: [id number]             PCP: Bartholome Bill, MD Referring: Bartholome Bill, MD Visit Date: 03/11/2018 Occupation: _0 @  Subjective:  Fatigue    History of Present Illness: Brittany Preston is a 22 y.o. female with history of systemic lupus erythematosus.  She is taking PLQ 200 mg BID M-F, MTX 8 tablets by mouth once weekly, folic acid 2 mg po daily, and Prednisone 1 mg po daily.  She has been taking 1 mg of prednisone since the beginning of December. She has not missed doses of medications recently.  She denies any recent rashes, sores in mouth or nose, hair loss, photosensitivity, sicca symptoms, or palpitations.  She denies any joint pain or joint swelling at this time.  She intermittently has swollen cervical lymph nodes.  She continues to have chronic fatigue.  She has been having an increased frequency of headaches, but she attributes this to looking at a computer screen all day.   She continues to have intermittent symptoms of Raynaud's but denies any digital ulcerations.    Activities of Daily Living:  Patient reports morning stiffness for 0 minutes.   Patient Denies nocturnal pain.  Difficulty dressing/grooming: Denies Difficulty climbing stairs: Denies Difficulty getting out of chair: Denies Difficulty using hands for taps, buttons, cutlery, and/or writing: Denies  Review of Systems  Constitutional: Positive for fatigue.  HENT: Negative for mouth sores, mouth dryness and nose dryness.   Eyes: Negative for pain, visual disturbance and dryness.  Respiratory: Negative for cough, hemoptysis, shortness of breath and difficulty breathing.   Cardiovascular: Negative for chest pain, palpitations, hypertension and swelling in legs/feet.  Gastrointestinal: Positive for constipation. Negative for blood in stool and diarrhea.  Endocrine: Negative for increased urination.    Genitourinary: Negative for painful urination.  Musculoskeletal: Negative for arthralgias, joint pain, joint swelling, myalgias, muscle weakness, morning stiffness, muscle tenderness and myalgias.  Skin: Negative for color change, pallor, rash, hair loss, nodules/bumps, skin tightness, ulcers and sensitivity to sunlight.  Allergic/Immunologic: Negative for susceptible to infections.  Neurological: Positive for headaches (Hx of migraines). Negative for dizziness, numbness and weakness.  Hematological: Negative for swollen glands.  Psychiatric/Behavioral: Negative for depressed mood and sleep disturbance. The patient is not nervous/anxious.     PMFS History:  Patient Active Problem List   Diagnosis Date Noted  . Systemic lupus erythematosus ([place]) 11/10/2016  . Vitamin D deficiency 11/06/2016  . Hair loss 10/14/2016  . Leukopenia 10/14/2016  . High risk medication use 10/14/2016  . Moderate persistent asthma without complication 63/84/6659    Past Medical History:  Diagnosis Date  . Asthma   . Lupus ([place]) 06/2016    History reviewed. No pertinent family history. History reviewed. No pertinent surgical history. Social History   Social History Narrative  . Not on file   Immunization History  Administered Date(s) Administered  . DTaP 03/04/1996, 06/27/1996, 10/07/1996, 07/07/1997, 01/03/2000  . HPV Quadrivalent 12/27/2008, 03/09/2009, 06/15/2009  . Hepatitis A 06/18/2005, 10/20/2006  . Hepatitis A, Ped/Adol-2 Dose 06/18/2005, 10/20/2006  . Hepatitis B 01/13/1996, 02/24/1996, 10/07/1996  . Hepatitis B, ped/adol 01/13/1996, 02/24/1996, 10/07/1996  . HiB (PRP-OMP) 03/04/1996, 06/27/1996, 10/07/1996, 07/07/1997  . HiB (PRP-T) 03/04/1996, 06/27/1996, 10/07/1996, 07/07/1997  . IPV 03/04/1996, 06/27/1996, 10/07/1996, 01/03/2000  . Influenza,inj,Quad  PF,6+ Mos 12/10/2015  . Influenza-Unspecified 12/10/2015, 01/07/2017  . MMR 12/30/1996, 01/03/2000  . Meningococcal Conjugate  12/27/2007, 03/29/2013  . Pneumococcal Conjugate-13 01/03/2000  . Pneumococcal Polysaccharide-23 08/04/2016  . Tdap 10/20/2006  . Varicella 12/30/1996, 06/18/2005    Objective: Vital Signs: BP 125/73 (BP Location: Left Arm, Patient Position: Sitting, Cuff Size: Normal)   Pulse 94   Resp 12   Ht _0  (1.575 m)   Wt 149 lb 9.6 oz (67.9 kg)   BMI 27.36 kg/m    Physical Exam Vitals signs and nursing note reviewed.  Constitutional:      Appearance: She is well-developed.  HENT:     Head: Normocephalic and atraumatic.     Comments: No nasal ulcerations noted.  No parotid swelling. Eyes:     Conjunctiva/sclera: Conjunctivae normal.  Neck:     Musculoskeletal: Normal range of motion.  Cardiovascular:     Rate and Rhythm: Normal rate and regular rhythm.     Heart sounds: Normal heart sounds.  Pulmonary:     Effort: Pulmonary effort is normal.     Breath sounds: Normal breath sounds.  Abdominal:     General: Bowel sounds are normal.     Palpations: Abdomen is soft.  Lymphadenopathy:     Cervical: No cervical adenopathy.  Skin:    General: Skin is warm and dry.     Capillary Refill: Capillary refill takes less than 2 seconds.     Comments: No malar rash.  No digital ulcerations or signs of gangrene.  Neurological:     Mental Status: She is alert and oriented to person, place, and time.  Psychiatric:        Behavior: Behavior normal.      Musculoskeletal Exam: C-spine, thoracic spine, lumbar spine good range of motion.  No midline spinal tenderness.  No SI joint tenderness.  Shoulder joints, elbow joints, wrist joints, MCPs, PIPs, DIPs good range of motion no synovitis.  Hip joints, knee joints, ankle joints, MTPs, PIPs, DIPs good range of motion with no synovitis.  No warmth or effusion bilateral knee joints.  No tenderness or swelling of ankle joints.  No tenderness of trochanteric bursa bilaterally.  CDAI Exam: CDAI Score: Not documented Patient Global Assessment: Not  documented; Provider Global Assessment: Not documented Swollen: 0 ; Tender: 0  Joint Exam   Not documented   There is currently no information documented on the homunculus. Go to the Rheumatology activity and complete the homunculus joint exam.  Investigation: No additional findings.  Imaging: No results found.  Recent Labs: Lab Results  Component Value Date   WBC 3.7 (L) 02/10/2018   HGB 12.9 02/10/2018   PLT 277 02/10/2018   NA 138 01/11/2018   K 3.5 01/11/2018   CL 103 01/11/2018   CO2 28 01/11/2018   GLUCOSE 70 01/11/2018   BUN 9 01/11/2018   CREATININE 0.71 01/11/2018   BILITOT 0.4 01/11/2018   ALKPHOS 34 11/13/2016   AST 22 01/11/2018   ALT 21 01/11/2018   PROT 8.2 (H) 01/11/2018   ALBUMIN 4.3 11/13/2016   CALCIUM 9.7 01/11/2018   GFRAA 140 01/11/2018    Speciality Comments: Baseline PLQ Eye Exam: 08/15/16 WNL follow up in 1 year  Procedures:  No procedures performed Allergies: Patient has no known allergies.     Assessment / Plan:     Visit Diagnoses: Other systemic lupus erythematosus with other organ involvement ([place]) - History of fatigue, weight loss, hair loss, malar rash, Raynauds, skin lupus,  neutropenia, elevated sedimentation rate, positive ANA,ds DNA, Sm, Ro, low C3: She has not had any recent signs or symptoms of a lupus flare.  She is clinically doing well on methotrexate 8 tablets by mouth once weekly, folic acid 2 mg by mouth daily, Plaquenil 200 mg twice daily Monday through Friday and prednisone 1 mg by mouth daily.  She has been tapering her prednisone and has been on 1 mg since the beginning of December.  She has not missed any doses of her medications recently.  She continues to have chronic fatigue, intermittent cervical lymphadenopathy, and intermittent symptoms of Raynaud's.  No cervical lymphadenopathy was palpable.  She had no digital ulcerations or signs of gangrene.  No malar rash noted.  No oral or nasal ulcerations.  She has not had any  sicca symptoms and no parotid swelling was noted.  She denied any recent hair loss or photosensitivity.  She denies any shortness of breath or palpitations recently.  She had no synovitis on exam.  She has not had any joint pain or joint swelling recently.  She will continue on methotrexate 8 tablets by mouth once weekly and Plaquenil 200 mg 1 tablet twice daily Monday through Friday.  She requested a refill of Zofran to be sent to the pharmacy.  She will continue taking prednisone 1 mg by mouth daily for the rest of December and then start tapering to 1 mg every other day for 1 month and then discontinue.  She will follow-up in the office in 3 months.  She was advised to notify us if she develops any new or worsening symptoms while tapering off of prednisone.  We will check autoimmune labs in January.- Plan: CBC with Differential/Platelet, Urinalysis, Routine w reflex microscopic, COMPLETE METABOLIC PANEL WITH GFR, Anti-DNA antibody, double-stranded, C3 and C4, Sedimentation rate  High risk medication use -  MTX 8 tablets po, folic acid 2 mg daily, PLQ 200 mg BID M-F, prednisone 1 mg.   Last Plaquenil eye exam normal on 08/15/2016.  She was given a Plaquenil eye exam form today in the office.  Most recent CBC within normal limits except WBC 3.7 on 02/10/2018.  Most recent CMP within normal limits on 01/11/2018.  Next CBC/CMP due in January and then every 3 months standing orders are in place.  - Plan: CBC with Differential/Platelet, COMPLETE METABOLIC PANEL WITH GFR  Raynaud's syndrome without gangrene: She continues to have intermittent symptoms of Raynaud's.  No digital ulcerations or signs of gangrene were noted.  She was encouraged to wear gloves and keep her core body temperature warm.  Hair loss: She has not had any recent hair loss.  Palpitations: She has not had any recent palpitations.  No pleural or pericardial rub auscultated.  Vitamin D deficiency: She is taking vitamin D 2000 units by mouth  daily.  Her PCP has been checking her vitamin D level.  Other insomnia: She is been sleeping better at night since tapering prednisone.   Other medical conditions are listed as follows:  History of asthma  Family history of lupus erythematosus   Orders: Orders Placed This Encounter  Procedures  . CBC with Differential/Platelet  . Urinalysis, Routine w reflex microscopic  . COMPLETE METABOLIC PANEL WITH GFR  . Anti-DNA antibody, double-stranded  . C3 and C4  . Sedimentation rate   Meds ordered this encounter  Medications  . ondansetron (ZOFRAN) 4 MG tablet    Sig: Take 1 tablet (4 mg total) by mouth every 6 (six) hours  as needed for nausea or vomiting.    Dispense:  30 tablet    Refill:  4     Follow-Up Instructions: Return in about 3 months (around 06/10/2018) for Systemic lupus erythematosus.   Ofilia Neas, PA-C  Note - This record has been created using Dragon software.  Chart creation errors have been sought, but may not always  have been located. Such creation errors do not reflect on  the standard of medical care.

## 2018-03-11 ENCOUNTER — Encounter: Payer: Self-pay | Admitting: Physician Assistant

## 2018-03-11 ENCOUNTER — Ambulatory Visit (INDEPENDENT_AMBULATORY_CARE_PROVIDER_SITE_OTHER): Payer: 59 | Admitting: Physician Assistant

## 2018-03-11 VITALS — BP 125/73 | HR 94 | Resp 12 | Ht 62.0 in | Wt 149.6 lb

## 2018-03-11 DIAGNOSIS — M3219 Other organ or system involvement in systemic lupus erythematosus: Secondary | ICD-10-CM | POA: Diagnosis not present

## 2018-03-11 DIAGNOSIS — L659 Nonscarring hair loss, unspecified: Secondary | ICD-10-CM

## 2018-03-11 DIAGNOSIS — Z79899 Other long term (current) drug therapy: Secondary | ICD-10-CM

## 2018-03-11 DIAGNOSIS — Z840 Family history of diseases of the skin and subcutaneous tissue: Secondary | ICD-10-CM

## 2018-03-11 DIAGNOSIS — R002 Palpitations: Secondary | ICD-10-CM

## 2018-03-11 DIAGNOSIS — I7300 Raynaud's syndrome without gangrene: Secondary | ICD-10-CM

## 2018-03-11 DIAGNOSIS — G4709 Other insomnia: Secondary | ICD-10-CM

## 2018-03-11 DIAGNOSIS — Z8709 Personal history of other diseases of the respiratory system: Secondary | ICD-10-CM

## 2018-03-11 DIAGNOSIS — E559 Vitamin D deficiency, unspecified: Secondary | ICD-10-CM

## 2018-03-11 MED ORDER — ONDANSETRON HCL 4 MG PO TABS: 4.0000 mg | ORAL_TABLET | Freq: Four times a day (QID) | ORAL | 4 refills | Status: DC | PRN

## 2018-03-11 NOTE — Patient Instructions (Signed)
Standing Labs We placed an order today for your standing lab work.    Please come back and get your standing labs in January and every 3 months   We have open lab Monday through Friday from 8:30-11:30 AM and 1:30-4:00 PM  at the office of Dr. Shaili Deveshwar.   You may experience shorter wait times on Monday and Friday afternoons. The office is located at [address], [address], [place], [place] [address] No appointment is necessary.   Labs are drawn by [facility].  You may receive a bill from [facility] for your lab work. If you have any questions regarding directions or hours of operation,  please call [phone].   Just as a reminder please drink plenty of water prior to coming for your lab work. Thanks!   

## 2018-03-14 ENCOUNTER — Other Ambulatory Visit: Payer: Self-pay | Admitting: Rheumatology

## 2018-03-15 NOTE — Telephone Encounter (Signed)
Last Visit: 03/11/18 Next Visit: 08/11/18 Labs: 01/11/18 WBC count mildly low Eye exam: 08/15/16 WNL   Left message to advise patient we need updated PLQ eye exam.  Okay to refill per Dr. Corliss Skainseveshwar

## 2018-03-22 ENCOUNTER — Other Ambulatory Visit: Payer: Self-pay | Admitting: *Deleted

## 2018-03-22 MED ORDER — PREDNISONE 1 MG PO TABS: 1.0000 mg | ORAL_TABLET | Freq: Every day | ORAL | 0 refills | Status: DC

## 2018-03-22 NOTE — Telephone Encounter (Signed)
Refill request received via fax  Last Visit: 03/11/18 Next Visit: 08/11/18  Patient states she is on Prednisone 1 mg daily.   Okay to refill per Dr. Corliss Skainseveshwar

## 2018-04-07 ENCOUNTER — Other Ambulatory Visit: Payer: Self-pay

## 2018-04-07 ENCOUNTER — Telehealth: Payer: Self-pay | Admitting: Rheumatology

## 2018-04-07 DIAGNOSIS — Z79899 Other long term (current) drug therapy: Secondary | ICD-10-CM

## 2018-04-07 DIAGNOSIS — M3219 Other organ or system involvement in systemic lupus erythematosus: Secondary | ICD-10-CM

## 2018-04-07 MED ORDER — METHOTREXATE 2.5 MG PO TABS: ORAL_TABLET | ORAL | 2 refills | Status: DC

## 2018-04-07 NOTE — Telephone Encounter (Signed)
Last Visit: 03/11/18 Next Visit: 08/11/18 Labs: 01/11/18 WBC count mildly low. Patient updating labs 04/07/18   Okay to refill per Dr. Corliss Skains

## 2018-04-07 NOTE — Telephone Encounter (Signed)
Patient is requesting prescription refill of Methotrexate to be sent to [facility] on [address] in [place].

## 2018-04-08 LAB — COMPLETE METABOLIC PANEL WITH GFR
AG Ratio: 1.3 (calc) (ref 1.0–2.5)
ALT: 18 U/L (ref 6–29)
AST: 18 U/L (ref 10–30)
Albumin: 4.5 g/dL (ref 3.6–5.1)
Alkaline phosphatase (APISO): 42 U/L (ref 33–115)
BUN: 10 mg/dL (ref 7–25)
CO2: 28 mmol/L (ref 20–32)
Calcium: 9.6 mg/dL (ref 8.6–10.2)
Chloride: 105 mmol/L (ref 98–110)
Creat: 0.76 mg/dL (ref 0.50–1.10)
GFR, Est African American: 129 mL/min/{1.73_m2} (ref >=60)
GFR, Est Non African American: 111 mL/min/{1.73_m2} (ref >=60)
GLUCOSE: 67 mg/dL (ref 65–99)
Globulin: 3.6 g/dL (calc) (ref 1.9–3.7)
Potassium: 3.9 mmol/L (ref 3.5–5.3)
Sodium: 139 mmol/L (ref 135–146)
Total Bilirubin: 0.3 mg/dL (ref 0.2–1.2)
Total Protein: 8.1 g/dL (ref 6.1–8.1)

## 2018-04-08 LAB — URINALYSIS, ROUTINE W REFLEX MICROSCOPIC
Bacteria, UA: NONE SEEN /HPF
Bilirubin Urine: NEGATIVE
GLUCOSE, UA: NEGATIVE
Hgb urine dipstick: NEGATIVE
Hyaline Cast: NONE SEEN /LPF
Ketones, ur: NEGATIVE
Nitrite: NEGATIVE
Protein, ur: NEGATIVE
Specific Gravity, Urine: 1.007 (ref 1.001–1.03)
pH: 7 (ref 5.0–8.0)

## 2018-04-08 LAB — CBC WITH DIFFERENTIAL/PLATELET
Absolute Monocytes: 460 cells/uL (ref 200–950)
Basophils Absolute: 12 cells/uL (ref 0–200)
Basophils Relative: 0.3 %
EOS PCT: 0.8 %
Eosinophils Absolute: 31 cells/uL (ref 15–500)
HCT: 38.7 % (ref 35.0–45.0)
Hemoglobin: 13.1 g/dL (ref 11.7–15.5)
Lymphs Abs: 823 cells/uL — ABNORMAL LOW (ref 850–3900)
MCH: 31.6 pg (ref 27.0–33.0)
MCHC: 33.9 g/dL (ref 32.0–36.0)
MCV: 93.3 fL (ref 80.0–100.0)
MPV: 9.7 fL (ref 7.5–12.5)
Monocytes Relative: 11.8 %
NEUTROS ABS: 2574 {cells}/uL (ref 1500–7800)
Neutrophils Relative %: 66 %
Platelets: 284 10*3/uL (ref 140–400)
RBC: 4.15 10*6/uL (ref 3.80–5.10)
RDW: 13 % (ref 11.0–15.0)
Total Lymphocyte: 21.1 %
WBC: 3.9 10*3/uL (ref 3.8–10.8)

## 2018-04-08 LAB — SEDIMENTATION RATE: Sed Rate: 17 mm/h (ref 0–20)

## 2018-04-08 LAB — ANTI-DNA ANTIBODY, DOUBLE-STRANDED: ds DNA Ab: 2 IU/mL

## 2018-04-08 LAB — C3 AND C4
C3 Complement: 109 mg/dL (ref 83–193)
C4 Complement: 21 mg/dL (ref 15–57)

## 2018-04-08 NOTE — Progress Notes (Signed)
Sed rate WNL. Complements WNL. DsDNA is 2. CMP WNL. CBC stable. UA revealed +1 leukocytes.  Please ask if the patient is experiencing symptoms of a UTI and advise her to follow up with PCP.

## 2018-04-28 ENCOUNTER — Telehealth: Payer: Self-pay | Admitting: Rheumatology

## 2018-04-28 MED ORDER — ONDANSETRON HCL 4 MG PO TABS: 4.0000 mg | ORAL_TABLET | Freq: Four times a day (QID) | ORAL | 4 refills | Status: DC | PRN

## 2018-04-28 NOTE — Telephone Encounter (Signed)
Patient called requesting prescription refill of Ondansetron to be sent to [facility] on [address] in [place].

## 2018-04-28 NOTE — Telephone Encounter (Signed)
Last Visit:03/11/18 Next Visit:08/11/18  Okay to refill per Dr. Corliss Skains

## 2018-05-04 IMAGING — CR DG CHEST 2V
2 series · 2 of 2 positions shown · non-contrast
Comparison: None.

CLINICAL DATA: Cough. High risk medication use, initiating
methotrexate use.

EXAM:
CHEST  2 VIEW

[chest pa]
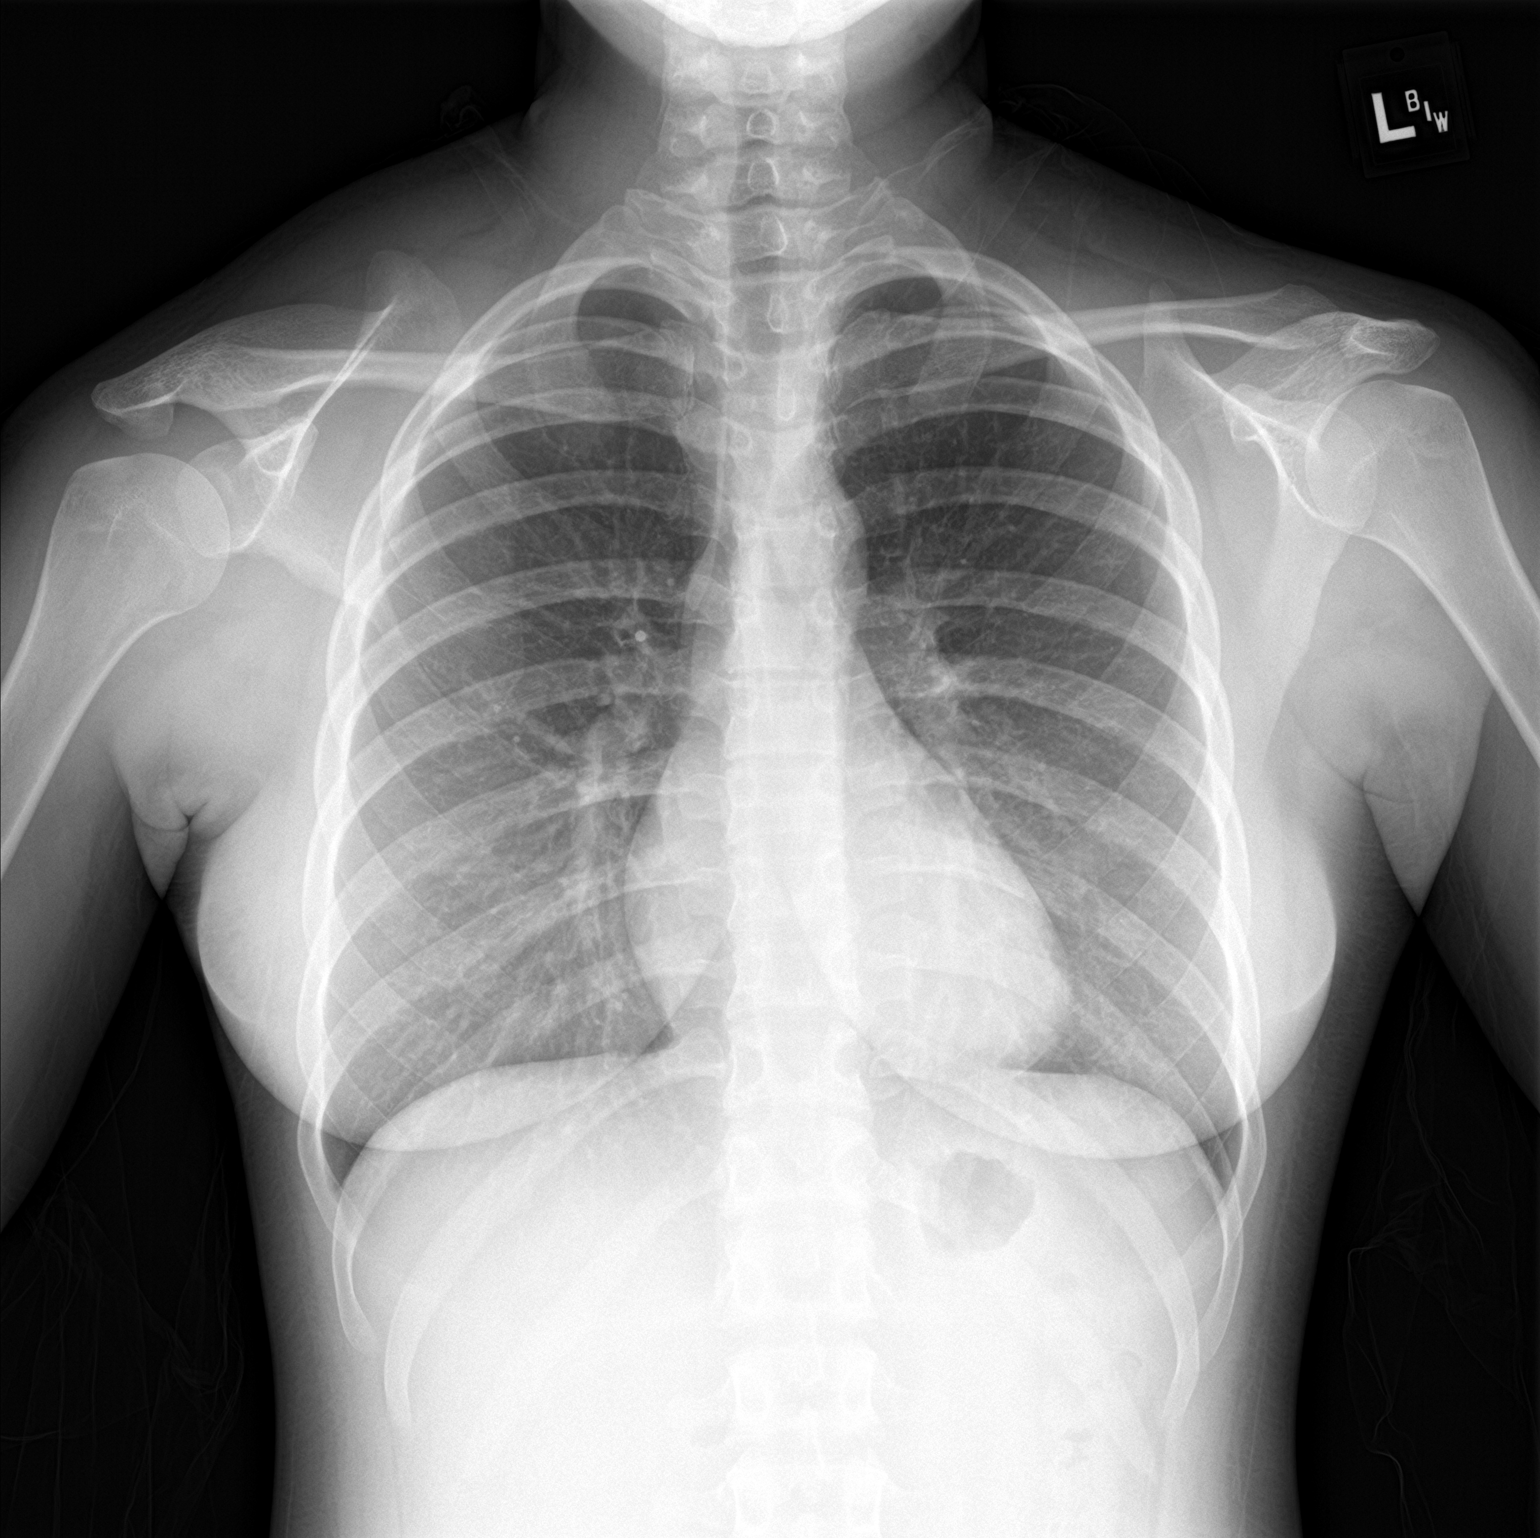

[chest lat]
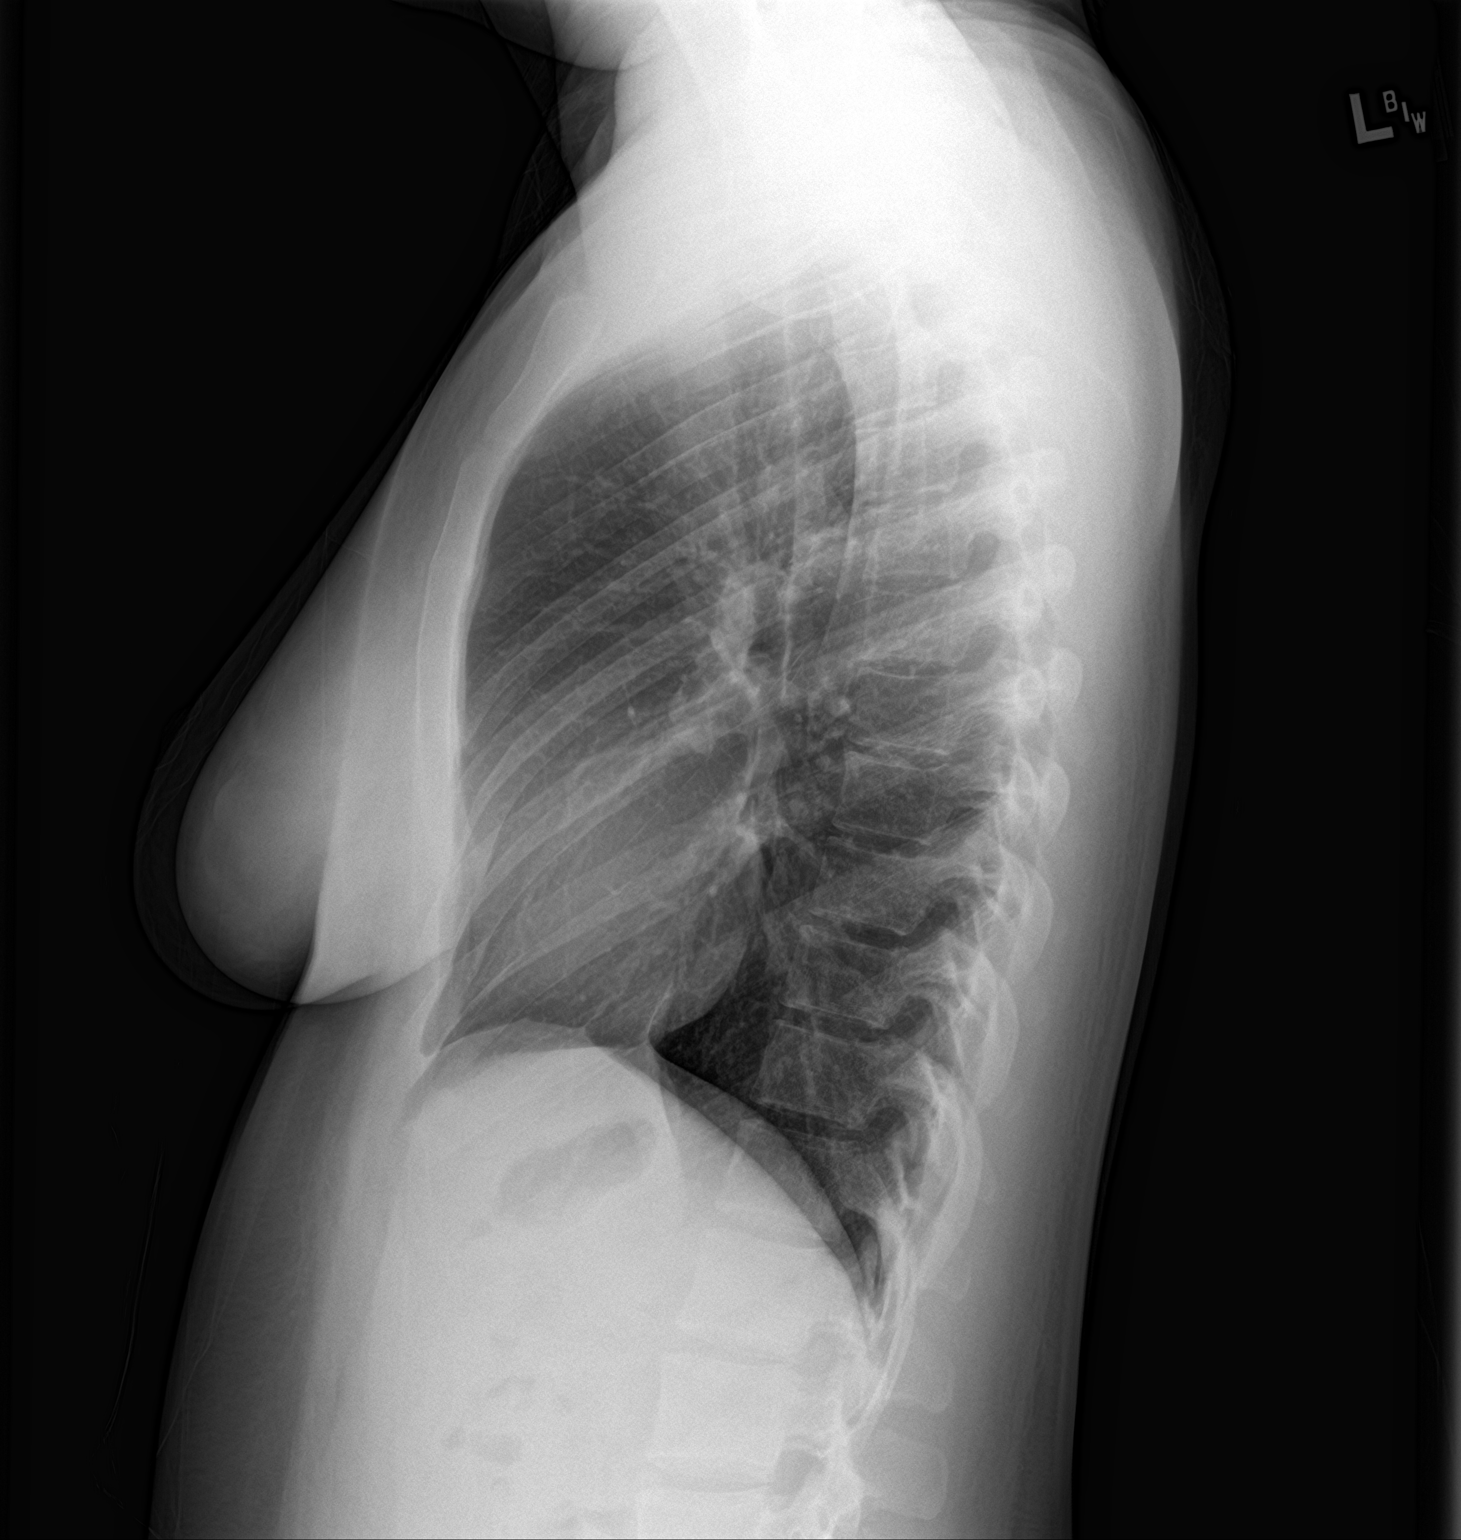

[2 of 2 positions shown; findings below may reference images not displayed]

FINDINGS: The cardiomediastinal silhouette is unremarkable.

There is no evidence of focal airspace disease, pulmonary edema,
suspicious pulmonary nodule/mass, pleural effusion, or pneumothorax.

No acute bony abnormalities are identified.
IMPRESSION: No active cardiopulmonary disease.

## 2018-05-07 ENCOUNTER — Telehealth: Payer: Self-pay | Admitting: Rheumatology

## 2018-05-07 NOTE — Telephone Encounter (Signed)
Patient request refill on MTX sent to [facility] on [address]. Patient is completely out.

## 2018-05-07 NOTE — Telephone Encounter (Signed)
Left message to advise patient she should have 2 refills at the pharmacy and to contact the pharmacy. Prescription was sent in on 04/07/18 with 2 additional refills.

## 2018-05-31 ENCOUNTER — Other Ambulatory Visit: Payer: Self-pay | Admitting: Rheumatology

## 2018-06-01 ENCOUNTER — Telehealth: Payer: Self-pay | Admitting: Rheumatology

## 2018-06-01 NOTE — Telephone Encounter (Signed)
Last Visit:03/11/18 Next Visit:08/11/18 Labs: 04/07/18 CMP WNL. CBC stable PLQ Eye Exam: 10/16/17 WNL   Okay to refill per Dr. Corliss Skains

## 2018-06-01 NOTE — Telephone Encounter (Signed)
Patient called stating she just spoke with [facility] who told her they need authorization from Dr. Corliss Skains before they will be able to refill her prescription of Plaquenil.  Patient is requesting the authorization be called in today so they can send her prescription by Monday when she is due to take it again.

## 2018-06-01 NOTE — Telephone Encounter (Signed)
Left message to advise patient prescription has been sent to the pharmacy.  

## 2018-06-02 ENCOUNTER — Telehealth: Payer: Self-pay | Admitting: Rheumatology

## 2018-06-02 MED ORDER — HYDROXYCHLOROQUINE SULFATE 200 MG PO TABS: ORAL_TABLET | ORAL | 0 refills | Status: DC

## 2018-06-02 NOTE — Addendum Note (Signed)
Addended by: Henriette Combs on: 06/02/2018 11:21 AM   Modules accepted: Orders

## 2018-06-02 NOTE — Telephone Encounter (Signed)
Patient left a voicemail requesting a small prescription to be sent to her local pharmacy.  Patient states she will not receive her prescription from [facility] in time.  Patient requested a return call.

## 2018-06-02 NOTE — Telephone Encounter (Signed)
Left message with patient's mother for patient to call the office.

## 2018-06-02 NOTE — Telephone Encounter (Signed)
Last Visit:03/11/18 Next Visit:08/11/18 Labs: 04/07/18 CMP WNL. CBC stable PLQ Eye Exam: 10/16/17 WNL   Okay to refill per Dr. Corliss Skains   Verified local pharmacy and patient advised prescription sent.

## 2018-06-08 ENCOUNTER — Telehealth: Payer: Self-pay | Admitting: Rheumatology

## 2018-06-08 NOTE — Telephone Encounter (Signed)
Patient called requesting a return call with any precautions she should be taking regarding the Coronavirus and her weakened immune system.  Patient is also checking if she should increase her Vitamins.

## 2018-06-08 NOTE — Telephone Encounter (Signed)
It is best to limit your contact with those who are sick and to practice good hand hygiene with soap and warm water or hand sanitizer when soap and water are not available. The infection is spread through respiratory droplets. It is important for persons who are sick to wear mask to prevent the spread of infections through respiratory droplets. Wearing a mask will not prevent you from getting the infection and is not necessary.  Patient advised to continue her normal vitamin routine. Patient asked if she is okay to use Kombucha with her MTX. Per [employer], pharmacist in office patient advised it is okay.

## 2018-06-13 ENCOUNTER — Other Ambulatory Visit: Payer: Self-pay | Admitting: Rheumatology

## 2018-06-14 ENCOUNTER — Telehealth: Payer: Self-pay | Admitting: Rheumatology

## 2018-06-14 MED ORDER — FOLIC ACID 1 MG PO TABS: 2.0000 mg | ORAL_TABLET | Freq: Every day | ORAL | 3 refills | Status: DC

## 2018-06-14 NOTE — Telephone Encounter (Signed)
Last Visit:03/11/18 Next Visit:08/11/18  Okay to refill per Dr. Corliss Skains

## 2018-06-14 NOTE — Telephone Encounter (Signed)
Last Visit:03/11/18 Next Visit:08/11/18 Labs: 04/07/18 CMP WNL. CBC stable  Okay to refill per Dr. Corliss Skains

## 2018-06-14 NOTE — Telephone Encounter (Signed)
Patient requesting a refill on Folic Acid sent to Mail order [facility] due to insurance. [facility] is no longer covered. Please send to [facility] for patient.

## 2018-06-18 ENCOUNTER — Other Ambulatory Visit: Payer: Self-pay | Admitting: Pharmacist

## 2018-06-18 MED ORDER — HYDROXYCHLOROQUINE SULFATE 200 MG PO TABS: ORAL_TABLET | ORAL | 0 refills | Status: DC

## 2018-06-18 NOTE — Progress Notes (Signed)
Spoke with patient about potential for backorder of Plaquenil and that we encourage her to go ahead and fill a 90 day supply.  Sent prescription to [facility] per patient request.

## 2018-06-22 ENCOUNTER — Telehealth: Payer: Self-pay | Admitting: Rheumatology

## 2018-06-22 MED ORDER — HYDROXYCHLOROQUINE SULFATE 200 MG PO TABS: ORAL_TABLET | ORAL | 0 refills | Status: DC

## 2018-06-22 NOTE — Telephone Encounter (Signed)
Patient states [facility] in [place] is out of Plaquenil. Patient request a mail order rx if possible. Please call to discuss.

## 2018-06-22 NOTE — Telephone Encounter (Signed)
Prescription has been resent to [facility] as the local pharmacy does not have PLQ. Patient advised.

## 2018-08-03 NOTE — Progress Notes (Deleted)
Virtual Visit via Video Note  I connected with Brittany Preston on 08/03/18 at  9:15 AM EDT by a video enabled telemedicine application and verified that I am speaking with the correct person using two identifiers.  Location: Patient: *** Provider: *** This service was conducted via virtual visit.  Both audio and visual tools were used.  The patient was located at home. I was located in my office.  Consent was obtained prior to the virtual visit and is aware of possible charges through their insurance for this visit.  The patient is an established patient.  Dr. Corliss Skains, MD conducted the virtual visit and Sherron Ales, PA-C acted as scribe during the service.  Office staff helped with scheduling follow up visits after the service was conducted.   I discussed the limitations of evaluation and management by telemedicine and the availability of in person appointments. The patient expressed understanding and agreed to proceed.  CC:  History of Present Illness: Patient is a 23 year old female with a past medical history of systemic lupus erythematosus.  She is taking MTX 8 tablets po, folic acid 2 mg daily, PLQ 163 mg BID M-F, and prednisone 1 mg.  Review of Systems  Constitutional: Negative for fever and malaise/fatigue.  Eyes: Negative for photophobia, pain, discharge and redness.  Respiratory: Negative for cough, shortness of breath and wheezing.   Cardiovascular: Negative for chest pain and palpitations.  Gastrointestinal: Negative for blood in stool, constipation and diarrhea.  Genitourinary: Negative for dysuria.  Musculoskeletal: Negative for back pain, joint pain, myalgias and neck pain.  Skin: Negative for rash.  Neurological: Negative for dizziness and headaches.  Psychiatric/Behavioral: Negative for depression. The patient is not nervous/anxious and does not have insomnia.       Observations/Objective: Physical Exam  Constitutional: She is oriented to person, place, and time and  well-developed, well-nourished, and in no distress.  HENT:  Head: Normocephalic and atraumatic.  Eyes: Conjunctivae are normal.  Pulmonary/Chest: Effort normal.  Neurological: She is alert and oriented to person, place, and time.  Psychiatric: Mood, memory, affect and judgment normal.   Patient reports morning stiffness for  {minute/hour:19697}.   Patient {Actions; denies-reports:120008} nocturnal pain.  Difficulty dressing/grooming: {ACTIONS;DENIES/REPORTS:21021675::"Denies"} Difficulty climbing stairs: {ACTIONS;DENIES/REPORTS:21021675::"Denies"} Difficulty getting out of chair: {ACTIONS;DENIES/REPORTS:21021675::"Denies"} Difficulty using hands for taps, buttons, cutlery, and/or writing: {ACTIONS;DENIES/REPORTS:21021675::"Denies"}  Assessment and Plan: Other systemic lupus erythematosus with other organ involvement (HCC) - History of fatigue, weight loss, hair loss, malar rash, Raynauds, skin lupus, neutropenia, elevated sedimentation rate, positive ANA,ds DNA, Sm, Ro, low C3:   High risk medication use -  MTX 8 tablets po, folic acid 2 mg daily, PLQ 845 mg BID M-F, prednisone 1 mg.   Last Plaquenil eye exam normal on 08/15/2016.    Raynaud's syndrome without gangrene:   Hair loss:  Palpitations:  Vitamin D deficiency: She is taking vitamin D 2000 units by mouth daily.    Other insomnia:  Follow Up Instructions: She will follow up in     I discussed the assessment and treatment plan with the patient. The patient was provided an opportunity to ask questions and all were answered. The patient agreed with the plan and demonstrated an understanding of the instructions.   The patient was advised to call back or seek an in-person evaluation if the symptoms worsen or if the condition fails to improve as anticipated.  I provided *** minutes of non-face-to-face time during this encounter.   Gearldine Bienenstock, PA-C

## 2018-08-05 NOTE — Progress Notes (Signed)
Virtual Visit via Video Note  I connected with Brittany Preston on 08/06/18 at  8:15 AM EDT by a video enabled telemedicine application and verified that I am speaking with the correct person using two identifiers.  Location: Patient: Home  Provider: Clinic  This service was conducted via virtual visit.  Both audio and visual tools were used.  The patient was located at home. I was located in my office.  Consent was obtained prior to the virtual visit and is aware of possible charges through their insurance for this visit.  The patient is an established patient.  Dr. Corliss Skains, MD conducted the virtual visit and Sherron Ales, PA-C acted as scribe during the service.  Office staff helped with scheduling follow up visits after the service was conducted.    I discussed the limitations of evaluation and management by telemedicine and the availability of in person appointments. The patient expressed understanding and agreed to proceed.  CC: Medication monitoring   History of Present Illness: Patient is a 23 year old female with a past medical history of systemic lupus erythematosus.  She is on MTX 8 tablets po once weekly, folic acid 2 mg po daily, and PLQ 200 mg BID.  She has been wearing sunscreen daily.  She has not had any recent rashes.  She only has symptoms of Raynaud's when she gets cold.  She denies any sores in mouth or nose.  She denies any sicca symptoms.   She denies any palpitations or shortness of breath.  She denies any joint pain or joint swelling.   Review of Systems  Constitutional: Negative for fever and malaise/fatigue.  Eyes: Negative for photophobia, pain, discharge and redness.  Respiratory: Negative for cough, shortness of breath and wheezing.   Cardiovascular: Negative for chest pain and palpitations.  Gastrointestinal: Negative for blood in stool, constipation and diarrhea.  Genitourinary: Negative for dysuria.  Musculoskeletal: Negative for back pain, joint pain, myalgias  and neck pain.  Skin: Negative for rash.  Neurological: Negative for dizziness and headaches.  Psychiatric/Behavioral: Negative for depression. The patient is not nervous/anxious and does not have insomnia.       Observations/Objective: Physical Exam  Constitutional: She is oriented to person, place, and time and well-developed, well-nourished, and in no distress.  HENT:  Head: Normocephalic and atraumatic.  Eyes: Conjunctivae are normal.  Pulmonary/Chest: Effort normal.  Neurological: She is alert and oriented to person, place, and time.  Psychiatric: Mood, memory, affect and judgment normal.   Patient reports morning stiffness for 0 minutes.   Patient denies nocturnal pain.  Difficulty dressing/grooming: Denies Difficulty climbing stairs: Denies Difficulty getting out of chair: Denies Difficulty using hands for taps, buttons, cutlery, and/or writing: Denies   Assessment and Plan: Other systemic lupus erythematosus with other organ involvement (HCC) - History of fatigue, weight loss, hair loss, malar rash, Raynauds, skin lupus, neutropenia, elevated sedimentation rate, positive ANA,ds DNA, Sm, Ro, low C3: She has not had any recent signs or symptoms of a flare.  She is clinically doing well on MTX 8 tablets po once weekly, folic acid 2 mg po daily, and PLQ 200 mg BID M-F. She is no longer on prednisone. She has no joint pain or joint swelling at this time.  No malar rash noted.  She has not had any recent rashes, and she has been wearing sunscreen daily.  She rarely experiences symptoms of Raynaud's, and she has no digital ulcerations or signs of gangrene.  She has no oral or  nasal ulcerations.  She has no sicca symptoms.  She has no worsening fatigue, swollen lymph nodes, or recent fevers.  She has not had any palpitations or shortness of breath.  She will continue on the current treatment regimen.  She does not need any refills.  She was advised to notify us if she develops any signs or  symptoms of a flare.  Future orders were placed today. She will follow up in 3 months.   High risk medication use -  MTX 8 tablets po, folic acid 2 mg daily, PLQ 161200 mg BID M-F. Last Plaquenil eye exam normal on 08/15/2016.    Raynaud's syndrome without gangrene: She rarely has symptoms of Raynaud's.  She has no digital ulcerations or signs of gangrene.   Hair loss: She has not had any recent hair loss.   Palpitations: She has not had any palpitations recently.   Vitamin D deficiency: She is taking a vitamin D supplement daily.   Other insomnia: She has been sleeping well at night.   Other medical conditions are listed as follows:   History of asthma  Family history of lupus erythematosus   Follow Up Instructions: She will follow up in 3 months.  Future orders were placed today.    I discussed the assessment and treatment plan with the patient. The patient was provided an opportunity to ask questions and all were answered. The patient agreed with the plan and demonstrated an understanding of the instructions.   The patient was advised to call back or seek an in-person evaluation if the symptoms worsen or if the condition fails to improve as anticipated.  I provided 25 minutes of non-face-to-face time during this encounter.  Pollyann SavoyShaili Deveshwar, MD   Scribed by-  Gearldine Bienenstockaylor M Detrell Umscheid, PA-C

## 2018-08-06 ENCOUNTER — Telehealth (INDEPENDENT_AMBULATORY_CARE_PROVIDER_SITE_OTHER): Payer: 59 | Admitting: Rheumatology

## 2018-08-06 ENCOUNTER — Encounter: Payer: Self-pay | Admitting: Rheumatology

## 2018-08-06 ENCOUNTER — Other Ambulatory Visit: Payer: Self-pay

## 2018-08-06 DIAGNOSIS — M3219 Other organ or system involvement in systemic lupus erythematosus: Secondary | ICD-10-CM

## 2018-08-06 DIAGNOSIS — Z79899 Other long term (current) drug therapy: Secondary | ICD-10-CM

## 2018-08-06 DIAGNOSIS — R002 Palpitations: Secondary | ICD-10-CM

## 2018-08-06 DIAGNOSIS — I7300 Raynaud's syndrome without gangrene: Secondary | ICD-10-CM

## 2018-08-06 DIAGNOSIS — L659 Nonscarring hair loss, unspecified: Secondary | ICD-10-CM

## 2018-08-06 DIAGNOSIS — E559 Vitamin D deficiency, unspecified: Secondary | ICD-10-CM

## 2018-08-06 DIAGNOSIS — Z8709 Personal history of other diseases of the respiratory system: Secondary | ICD-10-CM

## 2018-08-06 DIAGNOSIS — G4709 Other insomnia: Secondary | ICD-10-CM

## 2018-08-06 DIAGNOSIS — Z840 Family history of diseases of the skin and subcutaneous tissue: Secondary | ICD-10-CM

## 2018-08-09 ENCOUNTER — Ambulatory Visit: Payer: 59 | Admitting: Rheumatology

## 2018-08-11 ENCOUNTER — Ambulatory Visit: Payer: Self-pay | Admitting: Physician Assistant

## 2018-08-13 ENCOUNTER — Telehealth: Payer: Self-pay | Admitting: Rheumatology

## 2018-08-13 NOTE — Telephone Encounter (Signed)
-----   Message from Henriette Combs, LPN sent at 0/09/3708  1:27 PM EDT ----- Please schedule patient a follow up in 3 months. Patient was seen for a telemedicine visit. Thanks!

## 2018-08-13 NOTE — Telephone Encounter (Signed)
LMOM for patient to call and schedule 3 month follow-up appointment. °

## 2018-08-17 ENCOUNTER — Other Ambulatory Visit: Payer: Self-pay

## 2018-08-17 DIAGNOSIS — Z79899 Other long term (current) drug therapy: Secondary | ICD-10-CM

## 2018-08-17 DIAGNOSIS — E559 Vitamin D deficiency, unspecified: Secondary | ICD-10-CM

## 2018-08-17 DIAGNOSIS — M3219 Other organ or system involvement in systemic lupus erythematosus: Secondary | ICD-10-CM

## 2018-08-18 ENCOUNTER — Telehealth: Payer: Self-pay | Admitting: *Deleted

## 2018-08-18 LAB — C3 AND C4
C3 Complement: 100 mg/dL (ref 83–193)
C4 Complement: 22 mg/dL (ref 15–57)

## 2018-08-18 LAB — URINALYSIS, ROUTINE W REFLEX MICROSCOPIC
Bacteria, UA: NONE SEEN /HPF
Bilirubin Urine: NEGATIVE
Glucose, UA: NEGATIVE
Hgb urine dipstick: NEGATIVE
Hyaline Cast: NONE SEEN /LPF
Ketones, ur: NEGATIVE
Nitrite: NEGATIVE
Protein, ur: NEGATIVE
RBC / HPF: NONE SEEN /HPF (ref 0–2)
Specific Gravity, Urine: 1.012 (ref 1.001–1.03)
pH: 7.5 (ref 5.0–8.0)

## 2018-08-18 LAB — CBC WITH DIFFERENTIAL/PLATELET
Absolute Monocytes: 386 cells/uL (ref 200–950)
Basophils Absolute: 9 cells/uL (ref 0–200)
Basophils Relative: 0.3 %
Eosinophils Absolute: 41 cells/uL (ref 15–500)
Eosinophils Relative: 1.4 %
HCT: 39.1 % (ref 35.0–45.0)
Hemoglobin: 13.1 g/dL (ref 11.7–15.5)
Lymphs Abs: 922 cells/uL (ref 850–3900)
MCH: 31.1 pg (ref 27.0–33.0)
MCHC: 33.5 g/dL (ref 32.0–36.0)
MCV: 92.9 fL (ref 80.0–100.0)
MPV: 10.1 fL (ref 7.5–12.5)
Monocytes Relative: 13.3 %
Neutro Abs: 1543 cells/uL (ref 1500–7800)
Neutrophils Relative %: 53.2 %
Platelets: 283 10*3/uL (ref 140–400)
RBC: 4.21 10*6/uL (ref 3.80–5.10)
RDW: 13.5 % (ref 11.0–15.0)
Total Lymphocyte: 31.8 %
WBC: 2.9 10*3/uL — ABNORMAL LOW (ref 3.8–10.8)

## 2018-08-18 LAB — COMPLETE METABOLIC PANEL WITH GFR
AG Ratio: 1.4 (calc) (ref 1.0–2.5)
ALT: 16 U/L (ref 6–29)
AST: 20 U/L (ref 10–30)
Albumin: 4.6 g/dL (ref 3.6–5.1)
Alkaline phosphatase (APISO): 38 U/L (ref 31–125)
BUN: 10 mg/dL (ref 7–25)
CO2: 27 mmol/L (ref 20–32)
Calcium: 10 mg/dL (ref 8.6–10.2)
Chloride: 103 mmol/L (ref 98–110)
Creat: 0.84 mg/dL (ref 0.50–1.10)
GFR, Est African American: 114 mL/min/{1.73_m2} (ref >=60)
GFR, Est Non African American: 99 mL/min/{1.73_m2} (ref >=60)
Globulin: 3.4 g/dL (calc) (ref 1.9–3.7)
Glucose, Bld: 84 mg/dL (ref 65–99)
Potassium: 4.3 mmol/L (ref 3.5–5.3)
Sodium: 138 mmol/L (ref 135–146)
Total Bilirubin: 0.4 mg/dL (ref 0.2–1.2)
Total Protein: 8 g/dL (ref 6.1–8.1)

## 2018-08-18 LAB — SEDIMENTATION RATE: Sed Rate: 14 mm/h (ref 0–20)

## 2018-08-18 LAB — VITAMIN D 25 HYDROXY (VIT D DEFICIENCY, FRACTURES): Vit D, 25-Hydroxy: 62 ng/mL (ref 30–100)

## 2018-08-18 LAB — ANTI-DNA ANTIBODY, DOUBLE-STRANDED: ds DNA Ab: 1 IU/mL

## 2018-08-18 MED ORDER — METHOTREXATE 2.5 MG PO TABS: 10.0000 mg | ORAL_TABLET | ORAL | 0 refills | Status: DC

## 2018-08-18 NOTE — Telephone Encounter (Signed)
-----   Message from Gearldine Bienenstock, PA-C sent at 08/18/2018  4:11 PM EDT ----- Complements WNL, sed rate WNL, dsDNA is 1.  Vitamin D is within desirable range-encourage maintenance dose of vitamin D.  CMP WNL.  WBC count is low.  Please advise patient to reduce MTX to 4 tablets po once weekly.  Advise patient to return in 2 wee ks to recheck CBC.   UA revealed 3+ leukocytes-sign of possible UTI.  Please notify patient and advise patient to follow up with PCP.

## 2018-08-18 NOTE — Progress Notes (Signed)
Complements WNL, sed rate WNL, dsDNA is 1.  Vitamin D is within desirable range-encourage maintenance dose of vitamin D.  CMP WNL.  WBC count is low.  Please advise patient to reduce MTX to 4 tablets po once weekly.  Advise patient to return in 2 weeks to recheck CBC.   UA revealed 3+ leukocytes-sign of possible UTI.  Please notify patient and advise patient to follow up with PCP.

## 2018-08-25 ENCOUNTER — Other Ambulatory Visit: Payer: Self-pay | Admitting: Rheumatology

## 2018-08-25 NOTE — Telephone Encounter (Signed)
Last Visit: 08/06/18 Next visit: 11/24/18 Labs: 08/17/18 CMP WNL. WBC count is low reduce MTX to 4 tablets po once weekly.   Okay to refill per Dr. Corliss Skains

## 2018-08-30 ENCOUNTER — Other Ambulatory Visit: Payer: Self-pay | Admitting: Rheumatology

## 2018-08-30 NOTE — Telephone Encounter (Signed)
Last Visit: 08/06/18 Next visit: 11/24/18 Labs: 08/17/18 CMP WNL. WBC count is low  Baseline PLQ Eye Exam: 10/16/17 WNL  Okay to refill per Dr. Corliss Skains

## 2018-09-01 ENCOUNTER — Other Ambulatory Visit: Payer: Self-pay | Admitting: *Deleted

## 2018-09-01 DIAGNOSIS — Z79899 Other long term (current) drug therapy: Secondary | ICD-10-CM

## 2018-09-01 LAB — CBC WITH DIFFERENTIAL/PLATELET
Absolute Monocytes: 422 cells/uL (ref 200–950)
Basophils Absolute: 10 cells/uL (ref 0–200)
Basophils Relative: 0.2 %
Eosinophils Absolute: 19 cells/uL (ref 15–500)
Eosinophils Relative: 0.4 %
HCT: 38.9 % (ref 35.0–45.0)
Hemoglobin: 12.8 g/dL (ref 11.7–15.5)
Lymphs Abs: 1152 cells/uL (ref 850–3900)
MCH: 30.9 pg (ref 27.0–33.0)
MCHC: 32.9 g/dL (ref 32.0–36.0)
MCV: 94 fL (ref 80.0–100.0)
MPV: 9.7 fL (ref 7.5–12.5)
Monocytes Relative: 8.8 %
Neutro Abs: 3197 cells/uL (ref 1500–7800)
Neutrophils Relative %: 66.6 %
Platelets: 304 10*3/uL (ref 140–400)
RBC: 4.14 10*6/uL (ref 3.80–5.10)
RDW: 13.3 % (ref 11.0–15.0)
Total Lymphocyte: 24 %
WBC: 4.8 10*3/uL (ref 3.8–10.8)

## 2018-09-02 NOTE — Progress Notes (Signed)
WBC count is normal now.  Patient may continue methotrexate 4 tablets p.o. weekly.  If she has any flare of symptoms she should notify us.

## 2018-09-20 ENCOUNTER — Telehealth: Payer: Self-pay | Admitting: Rheumatology

## 2018-09-20 NOTE — Telephone Encounter (Signed)
Patient left a voicemail stating she had a questions regarding a medication.  Patient requested a return call.

## 2018-09-20 NOTE — Telephone Encounter (Signed)
Patient states she has been been taking Zyrtec and it is making her drowsy. Patient wanted to know what other allergy medication she may take that won't make her drowsy.  Patient advised per Mariella Saa, pharmacist she may try Allegra or Claritin or she may try Flonase or Nasocort.

## 2018-10-12 ENCOUNTER — Telehealth: Payer: Self-pay | Admitting: Rheumatology

## 2018-10-12 DIAGNOSIS — M3219 Other organ or system involvement in systemic lupus erythematosus: Secondary | ICD-10-CM

## 2018-10-12 MED ORDER — HYDROXYCHLOROQUINE SULFATE 200 MG PO TABS: ORAL_TABLET | ORAL | 0 refills | Status: DC

## 2018-10-12 NOTE — Telephone Encounter (Signed)
Patient left a voicemail stating her medication is currently out of stock at the pharmacy and is requesting a return call.

## 2018-10-12 NOTE — Telephone Encounter (Signed)
Last Visit: 08/06/18 Next visit: 11/24/18 Labs: 08/17/18 CMP WNL. WBC count is low reduce MTX to 4 tablets po once weekly.  Baseline PLQ Eye Exam: 10/16/17 WNL  Okay to refill per Dr. Estanislado Pandy

## 2018-10-12 NOTE — Telephone Encounter (Signed)
Patient contacted the office to states her mail order pharmacy does not have PLQ to refill her prescription. Patient states she will call her local pharmacy to see if they have the medication available to fill her prescription. Patient will let us know where to send prescription.

## 2018-10-12 NOTE — Addendum Note (Signed)
Addended by: Carole Binning on: 10/12/2018 03:53 PM   Modules accepted: Orders

## 2018-10-21 ENCOUNTER — Telehealth: Payer: Self-pay | Admitting: Rheumatology

## 2018-10-21 NOTE — Telephone Encounter (Signed)
Patient LMOM stating she has a follow up appt scheduled for next month, but could not remember if she needs labs before that appt. Please call to advise.

## 2018-10-21 NOTE — Telephone Encounter (Signed)
Patient advised she is not due for labs prior to her next appointment. Patient verbalized understanding.

## 2018-11-10 NOTE — Progress Notes (Signed)
Office Visit Note  Patient: Brittany Preston             Date of Birth: 06-03-1995           MRN: [id number]             PCP: Bartholome Bill, MD Referring: Bartholome Bill, MD Visit Date: 11/24/2018 Occupation: '@GUAROCC'$ @  Subjective:  Morning stiffness   History of Present Illness: Brittany Preston is a 23 y.o. female with history of systemic lupus erythematosus.   She denies any recent flares.  She denies any recent rashes or photosensitivity.  She denies any sores in mouth or nose.  She denies any shortness of breath or palpitations.  She denies any joint pain or joint swelling.  She has morning stiffness lasting 15 minutes daily.  She has been stretching every morning and walking for exercise.  She denies any fatigue, fevers, or swollen lymph nodes.  She has intermittent symptoms of Raynaud's.   Activities of Daily Living:  Patient reports morning stiffness for 15 minutes.   Patient Denies nocturnal pain.  Difficulty dressing/grooming: Denies Difficulty climbing stairs: Denies Difficulty getting out of chair: Denies Difficulty using hands for taps, buttons, cutlery, and/or writing: Denies  Review of Systems  Constitutional: Negative for fatigue.  HENT: Negative for mouth sores, mouth dryness and nose dryness.   Eyes: Negative for pain, visual disturbance and dryness.  Respiratory: Negative for cough, hemoptysis, shortness of breath and difficulty breathing.   Cardiovascular: Negative for chest pain, palpitations, hypertension and swelling in legs/feet.  Gastrointestinal: Negative for blood in stool, constipation and diarrhea.  Endocrine: Negative for increased urination.  Genitourinary: Negative for painful urination.  Musculoskeletal: Positive for morning stiffness. Negative for arthralgias, joint pain, joint swelling, myalgias, muscle weakness, muscle tenderness and myalgias.  Skin: Positive for color change. Negative for pallor, rash, hair loss, nodules/bumps, skin  tightness, ulcers and sensitivity to sunlight.  Allergic/Immunologic: Negative for susceptible to infections.  Neurological: Negative for dizziness, numbness, headaches and weakness.  Hematological: Negative for swollen glands.  Psychiatric/Behavioral: Negative for depressed mood and sleep disturbance. The patient is not nervous/anxious.     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 No pertinent family history. History reviewed. No pertinent surgical history. Social History   Social History Narrative   Not on file   Immunization History  Administered Date(s) Administered   DTaP 03/04/1996, 06/27/1996, 10/07/1996, 07/07/1997, 01/03/2000   HPV Quadrivalent 12/27/2008, 03/09/2009, 06/15/2009   Hepatitis A 06/18/2005, 10/20/2006   Hepatitis A, Ped/Adol-2 Dose 06/18/2005, 10/20/2006   Hepatitis B 01/13/1996, 02/24/1996, 10/07/1996   Hepatitis B, ped/adol 01/13/1996, 02/24/1996, 10/07/1996   HiB (PRP-OMP) 03/04/1996, 06/27/1996, 10/07/1996, 07/07/1997   HiB (PRP-T) 03/04/1996, 06/27/1996, 10/07/1996, 07/07/1997   IPV 03/04/1996, 06/27/1996, 10/07/1996, 01/03/2000   Influenza,inj,Quad PF,6+ Mos 12/10/2015   Influenza-Unspecified 12/10/2015, 01/07/2017   MMR 12/30/1996, 01/03/2000   Meningococcal Conjugate 12/27/2007, 03/29/2013   Pneumococcal Conjugate-13 01/03/2000   Pneumococcal Polysaccharide-23 08/04/2016   Tdap 10/20/2006   Varicella 12/30/1996, 06/18/2005     Objective: Vital Signs: BP 115/77 (BP Location: Left Arm, Patient Position: Sitting, Cuff Size: Normal)  Pulse 84    Resp 11    Ht '5\' 2"'$  (1.575  m)    Wt 150 lb 6.4 oz (68.2 kg)    BMI 27.51 kg/m    Physical Exam Vitals signs and nursing note reviewed.  Constitutional:      Appearance: She is well-developed.  HENT:     Head: Normocephalic and atraumatic.  Eyes:     Conjunctiva/sclera: Conjunctivae normal.  Neck:     Musculoskeletal: Normal range of motion.  Cardiovascular:     Rate and Rhythm: Normal rate and regular rhythm.     Heart sounds: Normal heart sounds.  Pulmonary:     Effort: Pulmonary effort is normal.     Breath sounds: Normal breath sounds.  Abdominal:     General: Bowel sounds are normal.     Palpations: Abdomen is soft.  Lymphadenopathy:     Cervical: No cervical adenopathy.  Skin:    General: Skin is warm and dry.     Capillary Refill: Capillary refill takes 2 to 3 seconds.  Neurological:     Mental Status: She is alert and oriented to person, place, and time.  Psychiatric:        Behavior: Behavior normal.      Musculoskeletal Exam: C-spine, thoracic spine, and lumbar spine good ROM. No midline spinal tenderness.  No SI joint tenderness. Shoulder joints, elbow joints, wrist joints, MCPs, PIPs, and DIPs good ROM with no synovitis.  Complete fist formation bilaterally.  Hip joints, knee joints, ankle joints, MTPs, PIPs, and DIPs good ROM with no synovitis.  No warmth or effusion of knee joints.  No tenderness or swelling of ankle joints.  No tenderness over trochanteric bursa bilaterally.   CDAI Exam: CDAI Score: -- Patient Global: --; Provider Global: -- Swollen: --; Tender: -- Joint Exam   No joint exam has been documented for this visit   There is currently no information documented on the homunculus. Go to the Rheumatology activity and complete the homunculus joint exam.  Investigation: No additional findings.  Imaging: No results found.  Recent Labs: Lab Results  Component Value Date   WBC 4.8 09/01/2018   HGB 12.8 09/01/2018   PLT 304 09/01/2018   NA 138 08/17/2018   K 4.3  08/17/2018   CL 103 08/17/2018   CO2 27 08/17/2018   GLUCOSE 84 08/17/2018   BUN 10 08/17/2018   CREATININE 0.84 08/17/2018   BILITOT 0.4 08/17/2018   ALKPHOS 34 11/13/2016   AST 20 08/17/2018   ALT 16 08/17/2018   PROT 8.0 08/17/2018   ALBUMIN 4.3 11/13/2016   CALCIUM 10.0 08/17/2018   GFRAA 114 08/17/2018    Speciality Comments: PLQ Eye Exam: 11/11/2018 WNL @ [facility] follow up in 1 year  Procedures:  No procedures performed Allergies: Patient has no known allergies.   Assessment / Plan:     Visit Diagnoses: Other systemic lupus erythematosus with other organ involvement ([place]) - History of fatigue, weight loss, hair loss, malar rash, Raynauds, skin lupus, neutropenia, elevated sedimentation rate, positive ANA,ds DNA, Sm, Ro, low C3: She has not had any signs or symptoms of a lupus flare.  She is clinically doing well on Plaquenil 200 mg 1 tablet by mouth twice daily M-F, Methotrexate 4 tablets by mouth once weekly and folic acid 1 mg po daily.  She has no synovitis on exam.  She has not experienced any joint pain but does have morning stiffness for about 15 minutes.  No malar  rash or hair loss noted.  She has not had any photosensitivity, but she was encouraged to wear sunscreen daily SPF >55.  She continues to have intermittent symptoms of Raynaud's (capillary refill 2-3 seconds).  No digital ulcerations or signs of gangrene were noted. She has not noticed any weight loss, fatigue, fevers, or swollen lymph nodes. She denies any chest pain, palpitations, or shortness of breath.  She will continue taking PLQ and MTX as prescribed.  She does not need any refills at this time.  Future orders for autoimmune lab work was placed today.  She was advised to notify us if she develops new or worsening symptoms.  She will follow up in 5 months. - Plan: Urinalysis, Routine w reflex microscopic, Anti-DNA antibody, double-stranded, C3 and C4, Sedimentation rate, CBC with Differential/Platelet,  COMPLETE METABOLIC PANEL WITH GFR  High risk medication use - MTX 4 tablets by mouth once weekly, folic acid 1 mg daily, PLQ 200 mg BID M-F.eye exam: 11/11/2018.  CBC and CMP will be drawn today.  She will return in October and every 3 months for lab work (future orders for autoimmune lab work placed today.) - Plan: CBC with Differential/Platelet, COMPLETE METABOLIC PANEL WITH GFR, COMPLETE METABOLIC PANEL WITH GFR, CBC with Differential/Platelet  Raynaud's syndrome without gangrene: She has intermittent symptoms of Raynaud's in fingers and toes.  She has no digital ulcerations or signs of gangrene.  She has delayed capillary refill 2-3 seconds.  She was encouraged to keep her core body temperature warm and to wear gloves/thick socks.   Hair loss: She has not had any recent hair loss.   Vitamin D deficiency: She takes vitamin D 2,000 units by mouth daily.   Palpitations: She has not had any palpitations recently.   Other insomnia: She has been sleeping well at night.  She has not had any recent fatigue.   Other medical conditions are listed as follows:   History of asthma  Family history of lupus erythematosus  Orders: Orders Placed This Encounter  Procedures   Urinalysis, Routine w reflex microscopic   Anti-DNA antibody, double-stranded   C3 and C4   Sedimentation rate   CBC with Differential/Platelet   COMPLETE METABOLIC PANEL WITH GFR   COMPLETE METABOLIC PANEL WITH GFR   CBC with Differential/Platelet   No orders of the defined types were placed in this encounter.     Follow-Up Instructions: Return in about 5 months (around 04/26/2019) for Systemic lupus erythematosus.   Ofilia Neas, PA-C  Note - This record has been created using Dragon software.  Chart creation errors have been sought, but may not always  have been located. Such creation errors do not reflect on  the standard of medical care.

## 2018-11-24 ENCOUNTER — Other Ambulatory Visit: Payer: Self-pay

## 2018-11-24 ENCOUNTER — Encounter: Payer: Self-pay | Admitting: Physician Assistant

## 2018-11-24 ENCOUNTER — Ambulatory Visit (INDEPENDENT_AMBULATORY_CARE_PROVIDER_SITE_OTHER): Payer: 59 | Admitting: Physician Assistant

## 2018-11-24 VITALS — BP 115/77 | HR 84 | Resp 11 | Ht 62.0 in | Wt 150.4 lb

## 2018-11-24 DIAGNOSIS — Z79899 Other long term (current) drug therapy: Secondary | ICD-10-CM | POA: Diagnosis not present

## 2018-11-24 DIAGNOSIS — R002 Palpitations: Secondary | ICD-10-CM

## 2018-11-24 DIAGNOSIS — Z840 Family history of diseases of the skin and subcutaneous tissue: Secondary | ICD-10-CM

## 2018-11-24 DIAGNOSIS — M3219 Other organ or system involvement in systemic lupus erythematosus: Secondary | ICD-10-CM

## 2018-11-24 DIAGNOSIS — L659 Nonscarring hair loss, unspecified: Secondary | ICD-10-CM | POA: Diagnosis not present

## 2018-11-24 DIAGNOSIS — I7300 Raynaud's syndrome without gangrene: Secondary | ICD-10-CM | POA: Diagnosis not present

## 2018-11-24 DIAGNOSIS — G4709 Other insomnia: Secondary | ICD-10-CM

## 2018-11-24 DIAGNOSIS — Z8709 Personal history of other diseases of the respiratory system: Secondary | ICD-10-CM

## 2018-11-24 DIAGNOSIS — E559 Vitamin D deficiency, unspecified: Secondary | ICD-10-CM

## 2018-11-24 NOTE — Patient Instructions (Addendum)
Standing Labs We placed an order today for your standing lab work.    Please come back and get your standing labs in October and every 3 months   We have open lab daily Monday through Thursday from 8:30-12:30 PM and 1:30-4:30 PM and Friday from 8:30-12:30 PM and 1:30 -4:00 PM at the office of Dr. Shaili Deveshwar.   You may experience shorter wait times on Monday and Friday afternoons. The office is located at [address], [address], [place], [place] [address] No appointment is necessary.   Labs are drawn by [facility].  You may receive a bill from [facility] for your lab work.  If you wish to have your labs drawn at another location, please call the office 24 hours in advance to send orders.  If you have any questions regarding directions or hours of operation,  please call [phone].   Just as a reminder please drink plenty of water prior to coming for your lab work. Thanks!   

## 2018-11-25 LAB — COMPLETE METABOLIC PANEL WITH GFR
AG Ratio: 1.3 (calc) (ref 1.0–2.5)
ALT: 11 U/L (ref 6–29)
AST: 16 U/L (ref 10–30)
Albumin: 4.4 g/dL (ref 3.6–5.1)
Alkaline phosphatase (APISO): 33 U/L (ref 31–125)
BUN: 10 mg/dL (ref 7–25)
CO2: 25 mmol/L (ref 20–32)
Calcium: 9.6 mg/dL (ref 8.6–10.2)
Chloride: 107 mmol/L (ref 98–110)
Creat: 0.84 mg/dL (ref 0.50–1.10)
GFR, Est African American: 114 mL/min/{1.73_m2} (ref >=60)
GFR, Est Non African American: 99 mL/min/{1.73_m2} (ref >=60)
Globulin: 3.3 g/dL (calc) (ref 1.9–3.7)
Glucose, Bld: 87 mg/dL (ref 65–99)
Potassium: 4.3 mmol/L (ref 3.5–5.3)
Sodium: 140 mmol/L (ref 135–146)
Total Bilirubin: 0.3 mg/dL (ref 0.2–1.2)
Total Protein: 7.7 g/dL (ref 6.1–8.1)

## 2018-11-25 LAB — CBC WITH DIFFERENTIAL/PLATELET
Absolute Monocytes: 295 cells/uL (ref 200–950)
Basophils Absolute: 10 cells/uL (ref 0–200)
Basophils Relative: 0.4 %
Eosinophils Absolute: 60 cells/uL (ref 15–500)
Eosinophils Relative: 2.5 %
HCT: 40.2 % (ref 35.0–45.0)
Hemoglobin: 13 g/dL (ref 11.7–15.5)
Lymphs Abs: 936 cells/uL (ref 850–3900)
MCH: 30.7 pg (ref 27.0–33.0)
MCHC: 32.3 g/dL (ref 32.0–36.0)
MCV: 94.8 fL (ref 80.0–100.0)
MPV: 9.5 fL (ref 7.5–12.5)
Monocytes Relative: 12.3 %
Neutro Abs: 1099 cells/uL — ABNORMAL LOW (ref 1500–7800)
Neutrophils Relative %: 45.8 %
Platelets: 258 10*3/uL (ref 140–400)
RBC: 4.24 10*6/uL (ref 3.80–5.10)
RDW: 13.1 % (ref 11.0–15.0)
Total Lymphocyte: 39 %
WBC: 2.4 10*3/uL — ABNORMAL LOW (ref 3.8–10.8)

## 2018-11-25 NOTE — Progress Notes (Signed)
CMP WNL.  WBC count is low-2.4.  Dr. Estanislado Pandy would like the patient to return in 1 month to recheck CBC.

## 2018-12-24 ENCOUNTER — Other Ambulatory Visit: Payer: Self-pay

## 2018-12-24 DIAGNOSIS — Z79899 Other long term (current) drug therapy: Secondary | ICD-10-CM

## 2018-12-24 DIAGNOSIS — M3219 Other organ or system involvement in systemic lupus erythematosus: Secondary | ICD-10-CM

## 2018-12-26 LAB — CBC WITH DIFFERENTIAL/PLATELET
Absolute Monocytes: 321 cells/uL (ref 200–950)
Basophils Absolute: 11 cells/uL (ref 0–200)
Basophils Relative: 0.4 %
Eosinophils Absolute: 30 cells/uL (ref 15–500)
Eosinophils Relative: 1.1 %
HCT: 38.3 % (ref 35.0–45.0)
Hemoglobin: 12.8 g/dL (ref 11.7–15.5)
Lymphs Abs: 869 cells/uL (ref 850–3900)
MCH: 30.9 pg (ref 27.0–33.0)
MCHC: 33.4 g/dL (ref 32.0–36.0)
MCV: 92.5 fL (ref 80.0–100.0)
MPV: 10.2 fL (ref 7.5–12.5)
Monocytes Relative: 11.9 %
Neutro Abs: 1469 cells/uL — ABNORMAL LOW (ref 1500–7800)
Neutrophils Relative %: 54.4 %
Platelets: 280 10*3/uL (ref 140–400)
RBC: 4.14 10*6/uL (ref 3.80–5.10)
RDW: 13.1 % (ref 11.0–15.0)
Total Lymphocyte: 32.2 %
WBC: 2.7 10*3/uL — ABNORMAL LOW (ref 3.8–10.8)

## 2018-12-26 LAB — URINALYSIS, ROUTINE W REFLEX MICROSCOPIC
Bacteria, UA: NONE SEEN /HPF
Bilirubin Urine: NEGATIVE
Glucose, UA: NEGATIVE
Hgb urine dipstick: NEGATIVE
Hyaline Cast: NONE SEEN /LPF
Ketones, ur: NEGATIVE
Nitrite: NEGATIVE
Specific Gravity, Urine: 1.019 (ref 1.001–1.03)
pH: 6 (ref 5.0–8.0)

## 2018-12-26 LAB — COMPLETE METABOLIC PANEL WITH GFR
AG Ratio: 1.5 (calc) (ref 1.0–2.5)
ALT: 9 U/L (ref 6–29)
AST: 16 U/L (ref 10–30)
Albumin: 4.5 g/dL (ref 3.6–5.1)
Alkaline phosphatase (APISO): 38 U/L (ref 31–125)
BUN: 9 mg/dL (ref 7–25)
CO2: 26 mmol/L (ref 20–32)
Calcium: 9.6 mg/dL (ref 8.6–10.2)
Chloride: 106 mmol/L (ref 98–110)
Creat: 0.85 mg/dL (ref 0.50–1.10)
GFR, Est African American: 113 mL/min/{1.73_m2} (ref >=60)
GFR, Est Non African American: 97 mL/min/{1.73_m2} (ref >=60)
Globulin: 3.1 g/dL (calc) (ref 1.9–3.7)
Glucose, Bld: 84 mg/dL (ref 65–99)
Potassium: 4 mmol/L (ref 3.5–5.3)
Sodium: 140 mmol/L (ref 135–146)
Total Bilirubin: 0.4 mg/dL (ref 0.2–1.2)
Total Protein: 7.6 g/dL (ref 6.1–8.1)

## 2018-12-26 LAB — ANTI-DNA ANTIBODY, DOUBLE-STRANDED: ds DNA Ab: 2 IU/mL

## 2018-12-26 LAB — C3 AND C4
C3 Complement: 101 mg/dL (ref 83–193)
C4 Complement: 22 mg/dL (ref 15–57)

## 2018-12-26 LAB — SEDIMENTATION RATE: Sed Rate: 6 mm/h (ref 0–20)

## 2018-12-27 ENCOUNTER — Other Ambulatory Visit: Payer: Self-pay | Admitting: Rheumatology

## 2018-12-27 DIAGNOSIS — M3219 Other organ or system involvement in systemic lupus erythematosus: Secondary | ICD-10-CM

## 2018-12-27 NOTE — Progress Notes (Signed)
WBC count is low but trending up.  Reviewed with Dr. Estanislado Pandy. We will continue to monitor neutropenia.    CMP WNL.  Sed rate WNL.  Complements WNL.  DsDNA is 2.   UA revealed trace protein and 2+ leukocytes.  Dr. Estanislado Pandy would like the patient to repeat UA (please make sure the patient is not on her menstrual cycle when she returns to repeat UA).

## 2018-12-27 NOTE — Telephone Encounter (Signed)
Last Visit: 11/24/18 Next Visit: 05/03/19 Labs: 12/24/18 WBC 2.7, Neutro Abs 1,469 PLQ Eye Exam: 11/11/2018 WNL  Okay to refill PLQ?

## 2018-12-27 NOTE — Telephone Encounter (Signed)
Ok to refill Plaquenil.

## 2019-01-19 ENCOUNTER — Telehealth: Payer: Self-pay | Admitting: Rheumatology

## 2019-01-19 MED ORDER — METHOTREXATE 2.5 MG PO TABS: 10.0000 mg | ORAL_TABLET | ORAL | 0 refills | Status: DC

## 2019-01-19 NOTE — Telephone Encounter (Signed)
Last Visit: 11/24/18 Next Visit: 05/03/19 Labs: 12/24/18 WBC 2.7, Neutro Abs 1,469  Okay to refill per Dr. Estanislado Pandy

## 2019-01-19 NOTE — Telephone Encounter (Signed)
Patient called stating [facility] is waiting for doctor's approval before they will refill her prescription of Methotrexate.  Patient states she is out of medication and requesting the prescription be sent in today.

## 2019-02-09 ENCOUNTER — Other Ambulatory Visit: Payer: Self-pay

## 2019-02-09 DIAGNOSIS — Z20822 Contact with and (suspected) exposure to covid-19: Secondary | ICD-10-CM

## 2019-02-11 LAB — NOVEL CORONAVIRUS, NAA: SARS-CoV-2, NAA: NOT DETECTED

## 2019-02-28 ENCOUNTER — Other Ambulatory Visit: Payer: Self-pay | Admitting: Rheumatology

## 2019-02-28 DIAGNOSIS — M3219 Other organ or system involvement in systemic lupus erythematosus: Secondary | ICD-10-CM

## 2019-02-28 NOTE — Telephone Encounter (Signed)
Notes recorded by Carole Binning, LPN on 2/81/1886 at 7:73 PM EDT  Patient advised WBC count is low but trending up. Reviewed with Dr. Estanislado Pandy. We will continue to monitor neutropenia. CMP WNL. Sed rate WNL. Complements WNL. DsDNA is 2. UA revealed trace protein and 2+ leukocytes. Patient advised Dr. Estanislado Pandy would like the patient to repeat UA (please make sure the patient is not on her menstrual cycle when she returns to repeat UA).   No record of UA repeated  PLQ RF 12/27/2018 Last visit 11/24/2018 Next visit 05/23/2019

## 2019-02-28 NOTE — Telephone Encounter (Signed)
Patient advised to repeat UA when she is not on her menstrual cycle.

## 2019-02-28 NOTE — Telephone Encounter (Signed)
Ok to refill PLQ.   Please advise patient to return for UA when she is not on her menstrual cycle since she had trace protein noted on her last UA.

## 2019-03-04 ENCOUNTER — Other Ambulatory Visit: Payer: Self-pay

## 2019-03-04 DIAGNOSIS — Z20822 Contact with and (suspected) exposure to covid-19: Secondary | ICD-10-CM

## 2019-03-07 LAB — NOVEL CORONAVIRUS, NAA: SARS-CoV-2, NAA: NOT DETECTED

## 2019-03-11 ENCOUNTER — Telehealth: Payer: Self-pay

## 2019-03-11 NOTE — Telephone Encounter (Signed)
03/09/2019  Received UA results from PCP.   Advised patient we received results and to contact PCP with any signs or symptoms of a UTI. Patient verbalized understanding.

## 2019-03-14 ENCOUNTER — Telehealth: Payer: Self-pay | Admitting: Rheumatology

## 2019-03-14 DIAGNOSIS — M3219 Other organ or system involvement in systemic lupus erythematosus: Secondary | ICD-10-CM

## 2019-03-14 DIAGNOSIS — Z79899 Other long term (current) drug therapy: Secondary | ICD-10-CM

## 2019-03-14 NOTE — Telephone Encounter (Signed)
Attempted to contact patient and unable to leave voicemail due to mailbox being full.

## 2019-03-14 NOTE — Telephone Encounter (Signed)
Patient left a voicemail requesting a return call to discuss a possible rash on her neck.

## 2019-03-15 ENCOUNTER — Encounter: Payer: Self-pay | Admitting: Rheumatology

## 2019-03-15 ENCOUNTER — Telehealth: Payer: Self-pay | Admitting: Rheumatology

## 2019-03-15 NOTE — Telephone Encounter (Signed)
It does not look like typical lupus rash.  I would recommend for her to see a dermatologist.  If she cannot get into see dermatologist soon then she can see her PCP before her appointment with the dermatologist.

## 2019-03-15 NOTE — Telephone Encounter (Signed)
Patient left a voicemail stating she was returning your call.   

## 2019-03-15 NOTE — Telephone Encounter (Signed)
Patient states she has a possible rash on her neck. Patient states some of the rash is "dry, dark and itchy." patient has been using aquaphor and states it has helped some. Patient will upload pictures to [facility] so Dr. Estanislado Pandy can view it.   Patient also requested lab orders be released for [facility]. Orders have been released and patient will go this week.

## 2019-03-15 NOTE — Telephone Encounter (Signed)
See previous telephone encounter.

## 2019-03-15 NOTE — Addendum Note (Signed)
Addended by: Francis Gaines C on: 03/15/2019 11:01 AM   Modules accepted: Orders

## 2019-03-21 LAB — COMPLETE METABOLIC PANEL WITH GFR
AG Ratio: 1.4 (calc) (ref 1.0–2.5)
ALT: 13 U/L (ref 6–29)
AST: 16 U/L (ref 10–30)
Albumin: 4.4 g/dL (ref 3.6–5.1)
Alkaline phosphatase (APISO): 41 U/L (ref 31–125)
BUN: 9 mg/dL (ref 7–25)
CO2: 24 mmol/L (ref 20–32)
Calcium: 9.8 mg/dL (ref 8.6–10.2)
Chloride: 105 mmol/L (ref 98–110)
Creat: 0.79 mg/dL (ref 0.50–1.10)
GFR, Est African American: 122 mL/min/{1.73_m2} (ref >=60)
GFR, Est Non African American: 106 mL/min/{1.73_m2} (ref >=60)
Globulin: 3.2 g/dL (calc) (ref 1.9–3.7)
Glucose, Bld: 81 mg/dL (ref 65–99)
Potassium: 4.1 mmol/L (ref 3.5–5.3)
Sodium: 139 mmol/L (ref 135–146)
Total Bilirubin: 0.3 mg/dL (ref 0.2–1.2)
Total Protein: 7.6 g/dL (ref 6.1–8.1)

## 2019-03-21 LAB — CBC WITH DIFFERENTIAL/PLATELET
Absolute Monocytes: 494 cells/uL (ref 200–950)
Basophils Absolute: 11 cells/uL (ref 0–200)
Basophils Relative: 0.3 %
Eosinophils Absolute: 42 cells/uL (ref 15–500)
Eosinophils Relative: 1.2 %
HCT: 38.9 % (ref 35.0–45.0)
Hemoglobin: 13.2 g/dL (ref 11.7–15.5)
Lymphs Abs: 1169 cells/uL (ref 850–3900)
MCH: 31.9 pg (ref 27.0–33.0)
MCHC: 33.9 g/dL (ref 32.0–36.0)
MCV: 94 fL (ref 80.0–100.0)
MPV: 10.1 fL (ref 7.5–12.5)
Monocytes Relative: 14.1 %
Neutro Abs: 1785 cells/uL (ref 1500–7800)
Neutrophils Relative %: 51 %
Platelets: 271 10*3/uL (ref 140–400)
RBC: 4.14 10*6/uL (ref 3.80–5.10)
RDW: 13 % (ref 11.0–15.0)
Total Lymphocyte: 33.4 %
WBC: 3.5 10*3/uL — ABNORMAL LOW (ref 3.8–10.8)

## 2019-03-21 LAB — URINALYSIS, ROUTINE W REFLEX MICROSCOPIC
Bacteria, UA: NONE SEEN /HPF
Bilirubin Urine: NEGATIVE
Glucose, UA: NEGATIVE
Hgb urine dipstick: NEGATIVE
Hyaline Cast: NONE SEEN /LPF
Ketones, ur: NEGATIVE
Nitrite: NEGATIVE
Specific Gravity, Urine: 1.02 (ref 1.001–1.03)
pH: 6.5 (ref 5.0–8.0)

## 2019-03-21 LAB — ANTI-DNA ANTIBODY, DOUBLE-STRANDED: ds DNA Ab: 2 IU/mL

## 2019-03-21 LAB — C3 AND C4
C3 Complement: 113 mg/dL (ref 83–193)
C4 Complement: 25 mg/dL (ref 15–57)

## 2019-03-21 LAB — SEDIMENTATION RATE: Sed Rate: 9 mm/h (ref 0–20)

## 2019-03-21 NOTE — Telephone Encounter (Signed)
Labs are stable.  Double-stranded DNA is pending.

## 2019-04-12 ENCOUNTER — Other Ambulatory Visit: Payer: Self-pay | Admitting: Rheumatology

## 2019-04-13 NOTE — Telephone Encounter (Signed)
Last Visit: 11/24/2018 Next Visit: 05/03/2019 Labs: 03/18/2019 Labs are stable.  Okay to refill per Dr. Corliss Skains.

## 2019-04-26 NOTE — Progress Notes (Signed)
Office Visit Note  Patient: Brittany Preston             Date of Birth: 18-Sep-1995           MRN: [id number]             PCP: Bartholome Bill, MD Referring: Bartholome Bill, MD Visit Date: 05/03/2019 Occupation: '@GUAROCC'$ @  Subjective:  Medication monitoring   History of Present Illness: Brittany Preston is a 24 y.o. female with history of systemic lupus erythematosus.  She is taking methotrexate 4 tablets by mouth once weekly and folic acid 1 mg by mouth daily.  She continues take Plaquenil 200 mg 1 tablet twice daily Monday through Friday. She denies any recent signs or symptoms of a lupus flare.  She has not had any recent rashes, photosensitivity, symptoms of Raynaud's, hair loss, oral or nasal ulcerations, sicca symptoms, enlarged lymph nodes, fevers, fatigue, chest pain, palpitations, or shortness of breath.  She continues to take a vitamin D supplement and DHEA as recommended.    Activities of Daily Living:  Patient reports morning stiffness for 0 minutes.   Patient Denies nocturnal pain.  Difficulty dressing/grooming: Denies Difficulty climbing stairs: Denies Difficulty getting out of chair: Denies Difficulty using hands for taps, buttons, cutlery, and/or writing: Denies  Review of Systems  Constitutional: Negative for fatigue.  HENT: Negative for mouth sores, mouth dryness and nose dryness.   Eyes: Negative for pain, visual disturbance and dryness.  Respiratory: Negative for cough, hemoptysis, shortness of breath, wheezing and difficulty breathing.   Cardiovascular: Negative for chest pain, palpitations, hypertension and swelling in legs/feet.  Gastrointestinal: Positive for constipation. Negative for blood in stool and diarrhea.  Endocrine: Negative for increased urination.  Genitourinary: Negative for difficulty urinating and painful urination.  Musculoskeletal: Negative for arthralgias, joint pain, joint swelling, myalgias, muscle weakness, morning stiffness,  muscle tenderness and myalgias.  Skin: Negative for pallor, rash, hair loss, nodules/bumps, skin tightness, ulcers and sensitivity to sunlight.  Allergic/Immunologic: Negative for susceptible to infections.  Neurological: Negative for dizziness, headaches, memory loss and weakness.  Hematological: Negative for swollen glands.  Psychiatric/Behavioral: Negative for depressed mood, confusion and sleep disturbance. The patient is not nervous/anxious.     PMFS History:  Patient Active Problem List   Diagnosis Date Noted  . Systemic lupus erythematosus ([place]) 11/10/2016  . Vitamin D deficiency 11/06/2016  . Hair loss 10/14/2016  . Leukopenia 10/14/2016  . High risk medication use 10/14/2016  . Moderate persistent asthma without complication 28/76/8115    Past Medical History:  Diagnosis Date  . Asthma   . Lupus ([place]) 06/2016    History reviewed. No pertinent family history. History reviewed. No pertinent surgical history. Social History   Social History Narrative  . Not on file   Immunization History  Administered Date(s) Administered  . DTaP 03/04/1996, 06/27/1996, 10/07/1996, 07/07/1997, 01/03/2000  . HPV Quadrivalent 12/27/2008, 03/09/2009, 06/15/2009  . Hepatitis A 06/18/2005, 10/20/2006  . Hepatitis A, Ped/Adol-2 Dose 06/18/2005, 10/20/2006  . Hepatitis B 01/13/1996, 02/24/1996, 10/07/1996  . Hepatitis B, ped/adol 01/13/1996, 02/24/1996, 10/07/1996  . HiB (PRP-OMP) 03/04/1996, 06/27/1996, 10/07/1996, 07/07/1997  . HiB (PRP-T) 03/04/1996, 06/27/1996, 10/07/1996, 07/07/1997  . IPV 03/04/1996, 06/27/1996, 10/07/1996, 01/03/2000  . Influenza,inj,Quad PF,6+ Mos 12/10/2015  . Influenza-Unspecified 12/10/2015, 01/07/2017  . MMR 12/30/1996, 01/03/2000  . Meningococcal Conjugate 12/27/2007, 03/29/2013  . Pneumococcal Conjugate-13 01/03/2000  . Pneumococcal Polysaccharide-23 08/04/2016  . Tdap 10/20/2006  . Varicella 12/30/1996, 06/18/2005     Objective: Vital Signs:  BP 129/76  (BP Location: Left Arm, Patient Position: Sitting, Cuff Size: Normal)   Pulse 96   Resp 12   Ht '5\' 3"'$  (1.6 m)   Wt 153 lb (69.4 kg)   BMI 27.10 kg/m    Physical Exam Vitals and nursing note reviewed.  Constitutional:      Appearance: She is well-developed.  HENT:     Head: Normocephalic and atraumatic.  Eyes:     Conjunctiva/sclera: Conjunctivae normal.  Cardiovascular:     Heart sounds: Normal heart sounds.  Pulmonary:     Effort: Pulmonary effort is normal.  Abdominal:     General: Bowel sounds are normal.     Palpations: Abdomen is soft.  Musculoskeletal:     Cervical back: Normal range of motion.  Lymphadenopathy:     Cervical: No cervical adenopathy.  Skin:    General: Skin is warm and dry.     Capillary Refill: Capillary refill takes 2 to 3 seconds.  Neurological:     Mental Status: She is alert and oriented to person, place, and time.  Psychiatric:        Behavior: Behavior normal.      Musculoskeletal Exam: C-spine, thoracic spine, and lumbar spine good ROM.  iShoulder joints, elbow joints, wrist joints, MCPs, PIPs, and DIPs good ROM with no synovitis.  Hip joints, knee joints, ankle joints, MCPs, PIPs, and DIPs good ROM with no synovitis.  No warmth or effusion of knee joints.  No tenderness or inflammation of ankle joints.  CDAI Exam: CDAI Score: -- Patient Global: --; Provider Global: -- Swollen: --; Tender: -- Joint Exam 05/03/2019   No joint exam has been documented for this visit   There is currently no information documented on the homunculus. Go to the Rheumatology activity and complete the homunculus joint exam.  Investigation: No additional findings.  Imaging: No results found.  Recent Labs: Lab Results  Component Value Date   WBC 3.5 (L) 03/18/2019   HGB 13.2 03/18/2019   PLT 271 03/18/2019   NA 139 03/18/2019   K 4.1 03/18/2019   CL 105 03/18/2019   CO2 24 03/18/2019   GLUCOSE 81 03/18/2019   BUN 9 03/18/2019   CREATININE 0.79  03/18/2019   BILITOT 0.3 03/18/2019   ALKPHOS 34 11/13/2016   AST 16 03/18/2019   ALT 13 03/18/2019   PROT 7.6 03/18/2019   ALBUMIN 4.3 11/13/2016   CALCIUM 9.8 03/18/2019   GFRAA 122 03/18/2019    Speciality Comments: PLQ Eye Exam: 11/11/2018 WNL @ [facility] follow up in 1 year  Procedures:  No procedures performed Allergies: Patient has no known allergies.   Assessment / Plan:     Visit Diagnoses: Other systemic lupus erythematosus with other organ involvement ([place]) - History of fatigue, weight loss, hair loss, malar rash, Raynauds, skin lupus, neutropenia, elevated sedimentation rate, positive ANA,ds DNA, Sm, Ro, low C3: She has not had any signs or symptoms of a lupus flare recently.  She is clinically doing well on methotrexate 4 tablets by mouth once weekly, folic acid 1 mg by mouth daily, Plaquenil 200 mg 1 tablet twice daily Monday through Friday.  She has no joint pain or joint synovitis at this time.  She has not had any recent rashes, photosensitivity, or hair loss.  No malar rash or signs of alopecia were noted.  She has not had any symptoms of Raynaud's recently.  She does have delayed capillary refill 2 to 3 seconds in both hands.  No digital ulcerations or signs of gangrene were noted.  She has not had any fatigue, enlarged lymph nodes, or fevers recently.  She denies any chest pain, shortness of breath, or palpitations recently.  Lab work revealed complements within normal limits, sed rate within normal limits, double-stranded DNA was 2 on 03/18/2019.  Future orders for autoimmune lab work were placed today.  She will continue on the current treatment regimen.  Refills of folic acid and Plaquenil were sent to the pharmacy today.  She was advised that she can reduce the dose of folic acid to 1 mg daily.  She was encouraged to continue to take DHEA and vitamin D supplement as recommended.  She is advised to notify us if she develops any new or worsening symptoms.  She will  follow-up in the office in 3 months.- Plan: hydroxychloroquine (PLAQUENIL) 200 MG tablet, Protein / creatinine ratio, urine, CBC with Differential/Platelet, COMPLETE METABOLIC PANEL WITH GFR, Urinalysis, Routine w reflex microscopic, Anti-DNA antibody, double-stranded, C3 and C4, Sedimentation rate, CBC with Differential/Platelet, COMPLETE METABOLIC PANEL WITH GFR  High risk medication use - MTX 4 tablets by mouth once weekly, folic acid 1 mg daily, PLQ 200 mg BID M-F.eye exam: 11/11/2018.  CBC and CMP were drawn on 03/18/2019.  She will return for lab work in March and every 3 months.  Standing orders in future orders for CBC and CMP were placed today.- Plan: CBC with Differential/Platelet, COMPLETE METABOLIC PANEL WITH GFR, CBC with Differential/Platelet, COMPLETE METABOLIC PANEL WITH GFR  Raynaud's syndrome without gangrene: Not active currently.  No digital ulcerations or signs of gangrene were noted.  She does have delayed capillary refill 2-3 seconds.  Discussed importance of keeping her core body temperature warm, wearing gloves, and thick socks.  Other decreased white blood cell (WBC) count: White blood cell count was 3.5 on 03/18/2019 which is an improvement from 12/24/2019 white blood cell count was 2.7.  We will continue to monitor CBC with differential closely.  Future orders and standing orders were placed today.  Hair loss: She has not had any hair loss recently.  Vitamin D deficiency - She is taking vitamin D 2,000 units by mouth daily.    Palpitations: She has not had any recent palpations or chest pain.   Other insomnia: She has been sleeping well at night.  Screening for blood or protein in urine -UA on 03/18/2019 revealed trace protein in 4 months prior she also had trace protein.  A future order for protein creatinine ratio and UA was placed today.  Plan: Protein / creatinine ratio, urine, Urinalysis, Routine w reflex microscopic  History of asthma: She is not having any shortness  of breath.   Family history of lupus erythematosus   Orders: Orders Placed This Encounter  Procedures  . Protein / creatinine ratio, urine  . CBC with Differential/Platelet  . COMPLETE METABOLIC PANEL WITH GFR  . Urinalysis, Routine w reflex microscopic  . Anti-DNA antibody, double-stranded  . C3 and C4  . Sedimentation rate  . CBC with Differential/Platelet  . COMPLETE METABOLIC PANEL WITH GFR   Meds ordered this encounter  Medications  . folic acid (FOLVITE) 1 MG tablet    Sig: Take 1 tablet (1 mg total) by mouth daily.    Dispense:  180 tablet    Refill:  1  . hydroxychloroquine (PLAQUENIL) 200 MG tablet    Sig: Take 1 tablet by mouth twice daily Monday through Friday only.  Do not take Saturday or Sunday.  Dispense:  120 tablet    Refill:  0    Requesting 1 year supply    Face-to-face time spent with patient was 30 minutes. Greater than 50% of time was spent in counseling and coordination of care.  Follow-Up Instructions: Return in about 3 months (around 07/31/2019) for Systemic lupus erythematosus.   Hazel Sams, PA-C  I examined and evaluated the patient with Hazel Sams PA.  Patient is clinically doing well.  Her labs have been stable.  She has been tolerating Plaquenil well.  We will check protein creatinine ratio with her next labs as she continues to have mild proteinuria.  The plan of care was discussed as noted above.  Bo Merino, MD  Note - This record has been created using Editor, commissioning.  Chart creation errors have been sought, but may not always  have been located. Such creation errors do not reflect on  the standard of medical care.

## 2019-05-03 ENCOUNTER — Other Ambulatory Visit: Payer: Self-pay

## 2019-05-03 ENCOUNTER — Ambulatory Visit: Payer: 59 | Admitting: Rheumatology

## 2019-05-03 ENCOUNTER — Encounter: Payer: Self-pay | Admitting: Rheumatology

## 2019-05-03 VITALS — BP 129/76 | HR 96 | Resp 12 | Ht 63.0 in | Wt 153.0 lb

## 2019-05-03 DIAGNOSIS — D72818 Other decreased white blood cell count: Secondary | ICD-10-CM | POA: Diagnosis not present

## 2019-05-03 DIAGNOSIS — E559 Vitamin D deficiency, unspecified: Secondary | ICD-10-CM

## 2019-05-03 DIAGNOSIS — Z1389 Encounter for screening for other disorder: Secondary | ICD-10-CM

## 2019-05-03 DIAGNOSIS — G4709 Other insomnia: Secondary | ICD-10-CM

## 2019-05-03 DIAGNOSIS — L659 Nonscarring hair loss, unspecified: Secondary | ICD-10-CM

## 2019-05-03 DIAGNOSIS — I7300 Raynaud's syndrome without gangrene: Secondary | ICD-10-CM | POA: Diagnosis not present

## 2019-05-03 DIAGNOSIS — M3219 Other organ or system involvement in systemic lupus erythematosus: Secondary | ICD-10-CM

## 2019-05-03 DIAGNOSIS — R002 Palpitations: Secondary | ICD-10-CM

## 2019-05-03 DIAGNOSIS — Z840 Family history of diseases of the skin and subcutaneous tissue: Secondary | ICD-10-CM

## 2019-05-03 DIAGNOSIS — Z79899 Other long term (current) drug therapy: Secondary | ICD-10-CM

## 2019-05-03 DIAGNOSIS — Z8709 Personal history of other diseases of the respiratory system: Secondary | ICD-10-CM

## 2019-05-03 MED ORDER — HYDROXYCHLOROQUINE SULFATE 200 MG PO TABS: ORAL_TABLET | ORAL | 0 refills | Status: DC

## 2019-05-03 MED ORDER — FOLIC ACID 1 MG PO TABS: 1.0000 mg | ORAL_TABLET | Freq: Every day | ORAL | 1 refills | Status: DC

## 2019-05-03 NOTE — Patient Instructions (Signed)
Standing Labs We placed an order today for your standing lab work.    Please come back and get your standing labs in March and every 3 months  We have open lab daily Monday through Thursday from 8:30-12:30 PM and 1:30-4:30 PM and Friday from 8:30-12:30 PM and 1:30-4:00 PM at the office of Dr. Shaili Deveshwar.   You may experience shorter wait times on Monday and Friday afternoons. The office is located at [address], [address], [place], [place] [address] No appointment is necessary.   Labs are drawn by [facility].  You may receive a bill from [facility] for your lab work.  If you wish to have your labs drawn at another location, please call the office 24 hours in advance to send orders.  If you have any questions regarding directions or hours of operation,  please call [phone].   Just as a reminder please drink plenty of water prior to coming for your lab work. Thanks!  

## 2019-06-08 ENCOUNTER — Telehealth: Payer: Self-pay | Admitting: Rheumatology

## 2019-06-08 NOTE — Telephone Encounter (Signed)
Patient called regarding Covid vaccine and her medication. Patient advised per Dr. Corliss Skains she is to take her dose of MTX this Friday and skip her dose of MTX next Friday. Patient is scheduled for her Covid vaccine 06/10/19. Patient verbalized understanding. Patient is due for labs and will be coming to office to have them completed. Patient advised our lab tech is in the office.

## 2019-06-08 NOTE — Telephone Encounter (Signed)
Patient left a voicemail stating she had questions regarding the COVID vaccine and also when her labs are due.  Patient is requesting a return call.

## 2019-06-17 ENCOUNTER — Other Ambulatory Visit: Payer: Self-pay | Admitting: *Deleted

## 2019-06-17 DIAGNOSIS — Z1389 Encounter for screening for other disorder: Secondary | ICD-10-CM

## 2019-06-17 DIAGNOSIS — Z79899 Other long term (current) drug therapy: Secondary | ICD-10-CM

## 2019-06-17 DIAGNOSIS — M3219 Other organ or system involvement in systemic lupus erythematosus: Secondary | ICD-10-CM

## 2019-06-20 LAB — COMPLETE METABOLIC PANEL WITH GFR
AG Ratio: 1.3 (calc) (ref 1.0–2.5)
ALT: 16 U/L (ref 6–29)
AST: 16 U/L (ref 10–30)
Albumin: 4.6 g/dL (ref 3.6–5.1)
Alkaline phosphatase (APISO): 41 U/L (ref 31–125)
BUN: 11 mg/dL (ref 7–25)
CO2: 26 mmol/L (ref 20–32)
Calcium: 9.8 mg/dL (ref 8.6–10.2)
Chloride: 105 mmol/L (ref 98–110)
Creat: 0.9 mg/dL (ref 0.50–1.10)
GFR, Est African American: 104 mL/min/{1.73_m2} (ref >=60)
GFR, Est Non African American: 90 mL/min/{1.73_m2} (ref >=60)
Globulin: 3.6 g/dL (calc) (ref 1.9–3.7)
Glucose, Bld: 85 mg/dL (ref 65–99)
Potassium: 4.3 mmol/L (ref 3.5–5.3)
Sodium: 138 mmol/L (ref 135–146)
Total Bilirubin: 0.3 mg/dL (ref 0.2–1.2)
Total Protein: 8.2 g/dL — ABNORMAL HIGH (ref 6.1–8.1)

## 2019-06-20 LAB — PROTEIN / CREATININE RATIO, URINE
Creatinine, Urine: 33 mg/dL (ref 20–275)
Protein/Creat Ratio: 61 mg/g creat (ref 21–161)
Protein/Creatinine Ratio: 0.061 mg/mg creat (ref 0.021–0.16)
Total Protein, Urine: 2 mg/dL — ABNORMAL LOW (ref 5–24)

## 2019-06-20 LAB — CBC WITH DIFFERENTIAL/PLATELET
Absolute Monocytes: 563 cells/uL (ref 200–950)
Basophils Absolute: 8 cells/uL (ref 0–200)
Basophils Relative: 0.2 %
Eosinophils Absolute: 50 cells/uL (ref 15–500)
Eosinophils Relative: 1.2 %
HCT: 40.9 % (ref 35.0–45.0)
Hemoglobin: 13.6 g/dL (ref 11.7–15.5)
Lymphs Abs: 1067 cells/uL (ref 850–3900)
MCH: 31.3 pg (ref 27.0–33.0)
MCHC: 33.3 g/dL (ref 32.0–36.0)
MCV: 94.2 fL (ref 80.0–100.0)
MPV: 10 fL (ref 7.5–12.5)
Monocytes Relative: 13.4 %
Neutro Abs: 2512 cells/uL (ref 1500–7800)
Neutrophils Relative %: 59.8 %
Platelets: 280 10*3/uL (ref 140–400)
RBC: 4.34 10*6/uL (ref 3.80–5.10)
RDW: 13 % (ref 11.0–15.0)
Total Lymphocyte: 25.4 %
WBC: 4.2 10*3/uL (ref 3.8–10.8)

## 2019-06-20 LAB — URINALYSIS, ROUTINE W REFLEX MICROSCOPIC
Bacteria, UA: NONE SEEN /HPF
Bilirubin Urine: NEGATIVE
Glucose, UA: NEGATIVE
Hgb urine dipstick: NEGATIVE
Hyaline Cast: NONE SEEN /LPF
Ketones, ur: NEGATIVE
Nitrite: NEGATIVE
Protein, ur: NEGATIVE
Specific Gravity, Urine: 1.005 (ref 1.001–1.03)
pH: 7 (ref 5.0–8.0)

## 2019-06-20 LAB — ANTI-DNA ANTIBODY, DOUBLE-STRANDED: ds DNA Ab: 2 IU/mL

## 2019-06-20 LAB — C3 AND C4
C3 Complement: 138 mg/dL (ref 83–193)
C4 Complement: 30 mg/dL (ref 15–57)

## 2019-06-20 LAB — SEDIMENTATION RATE: Sed Rate: 17 mm/h (ref 0–20)

## 2019-06-20 NOTE — Progress Notes (Signed)
Urine protein creatinine ratio WNL.  CBC WNL. Total protein borderline elevated. Rest of CMP WNL.  We will continue to monitor.     UA revealed findings consistent with possible UTI.  +leukocytes but negative for nitrites. Please advise patient to follow up with PCP for further evaluation if she is experiencing symptoms of a UTI.  DsDNA negative.  Complements WNL.  ESR WNL.  Labs are not consistent with a flare.

## 2019-07-04 ENCOUNTER — Other Ambulatory Visit: Payer: Self-pay | Admitting: Physician Assistant

## 2019-07-04 DIAGNOSIS — M3219 Other organ or system involvement in systemic lupus erythematosus: Secondary | ICD-10-CM

## 2019-07-14 ENCOUNTER — Other Ambulatory Visit: Payer: Self-pay | Admitting: Rheumatology

## 2019-07-14 ENCOUNTER — Other Ambulatory Visit: Payer: Self-pay | Admitting: Physician Assistant

## 2019-07-14 DIAGNOSIS — M3219 Other organ or system involvement in systemic lupus erythematosus: Secondary | ICD-10-CM

## 2019-07-14 MED ORDER — METHOTREXATE 2.5 MG PO TABS: ORAL_TABLET | ORAL | 0 refills | Status: DC

## 2019-07-14 NOTE — Telephone Encounter (Signed)
Last Visit: 05/03/2019 Next Visit: 08/03/2019  Okay to refill zofran?

## 2019-07-14 NOTE — Telephone Encounter (Signed)
ok 

## 2019-07-14 NOTE — Telephone Encounter (Signed)
Last Visit: 05/03/2019 Next Visit: 08/03/2019 Labs: 06/17/2019 CBC WNL. Total protein borderline elevated. Rest of CMP WNL. We will continue to monitor.  Eye exam: 11/11/2018   Okay to refill per Dr. Corliss Skains.

## 2019-07-14 NOTE — Telephone Encounter (Signed)
Last Visit: 05/03/2019 Next Visit: 08/03/2019 Labs: 06/17/2019 CBC WNL. Total protein borderline elevated. Rest of CMP WNL. We will continue to monitor.    Okay to refill per Dr. Corliss Skains.

## 2019-07-28 NOTE — Progress Notes (Signed)
Office Visit Note  Patient: Brittany Preston             Date of Birth: July 29, 1995           MRN: [id number]             PCP: Bartholome Bill, MD Referring: Bartholome Bill, MD Visit Date: 08/03/2019 Occupation: '@GUAROCC'$ @  Subjective:  Medication monitoring   History of Present Illness: Brittany Preston is a 24 y.o. female with history of systemic lupus erythematosus.  She is taking methotrexate 4 tablets by mouth once weekly, folic acid 1 mg by mouth daily, Plaquenil 200 mg 1 tablet twice daily.  She has not missed any doses of methotrexate or Plaquenil recently.  She denies any signs or symptoms of systemic lupus flare.  She denies any recent rashes.  She has been trying to avoid direct sun exposure due to photosensitivity.  She denies any hair loss recently.  She has not had any symptoms of Raynaud's.  She denies any oral nasal ulcerations.  She is not had any sicca symptoms.  She denies any enlarged lymph nodes or increased fatigue.  She experiences discomfort and stiffness in both knees personally in the morning lasting at 10 minutes.  She denies any swelling or warmth in her knee joints.  She denies any mechanical symptoms.  She has been trying to perform stretching exercises and yoga in the morning on a daily basis to help.  She denies any other joint pain or joint swelling at this time.  She denies any palpitations, shortness of breath, or chest pain recently. She continues to take vitamin D 2,000 units daily.    Activities of Daily Living:  Patient reports morning stiffness for 10 minutes.   Patient Denies nocturnal pain.  Difficulty dressing/grooming: Denies Difficulty climbing stairs: Denies Difficulty getting out of chair: Denies Difficulty using hands for taps, buttons, cutlery, and/or writing: Denies  Review of Systems  Constitutional: Negative for fatigue.  HENT: Negative for mouth sores, mouth dryness and nose dryness.   Eyes: Negative for pain, itching, visual  disturbance and dryness.  Respiratory: Negative for cough, hemoptysis, shortness of breath and difficulty breathing.   Cardiovascular: Negative for chest pain, palpitations, hypertension and swelling in legs/feet.  Gastrointestinal: Negative for blood in stool, constipation and diarrhea.  Endocrine: Negative for increased urination.  Genitourinary: Negative for difficulty urinating and painful urination.  Musculoskeletal: Positive for arthralgias, joint pain and morning stiffness. Negative for joint swelling, myalgias, muscle weakness, muscle tenderness and myalgias.  Skin: Positive for color change. Negative for pallor, rash, hair loss, nodules/bumps, redness, skin tightness, ulcers and sensitivity to sunlight.  Allergic/Immunologic: Negative for susceptible to infections.  Neurological: Negative for dizziness, numbness, headaches, memory loss and weakness.  Hematological: Negative for bruising/bleeding tendency and swollen glands.  Psychiatric/Behavioral: Negative for depressed mood, confusion and sleep disturbance. The patient is not nervous/anxious.     PMFS History:  Patient Active Problem List   Diagnosis Date Noted  . Systemic lupus erythematosus ([place]) 11/10/2016  . Vitamin D deficiency 11/06/2016  . Hair loss 10/14/2016  . Leukopenia 10/14/2016  . High risk medication use 10/14/2016  . Moderate persistent asthma without complication 01/75/1025    Past Medical History:  Diagnosis Date  . Asthma   . Lupus ([place]) 06/2016    History reviewed. No pertinent family history. History reviewed. No pertinent surgical history. Social History   Social History Narrative  . Not on file   Immunization History  Administered  Date(s) Administered  . DTaP 03/04/1996, 06/27/1996, 10/07/1996, 07/07/1997, 01/03/2000  . HPV Quadrivalent 12/27/2008, 03/09/2009, 06/15/2009  . Hepatitis A 06/18/2005, 10/20/2006  . Hepatitis A, Ped/Adol-2 Dose 06/18/2005, 10/20/2006  . Hepatitis B 01/13/1996,  02/24/1996, 10/07/1996  . Hepatitis B, ped/adol 01/13/1996, 02/24/1996, 10/07/1996  . HiB (PRP-OMP) 03/04/1996, 06/27/1996, 10/07/1996, 07/07/1997  . HiB (PRP-T) 03/04/1996, 06/27/1996, 10/07/1996, 07/07/1997  . IPV 03/04/1996, 06/27/1996, 10/07/1996, 01/03/2000  . Influenza,inj,Quad PF,6+ Mos 12/10/2015  . Influenza-Unspecified 12/10/2015, 01/07/2017  . MMR 12/30/1996, 01/03/2000  . Meningococcal Conjugate 12/27/2007, 03/29/2013  . Pneumococcal Conjugate-13 01/03/2000  . Pneumococcal Polysaccharide-23 08/04/2016  . Tdap 10/20/2006  . Varicella 12/30/1996, 06/18/2005     Objective: Vital Signs: BP 111/77 (BP Location: Left Arm, Patient Position: Sitting, Cuff Size: Normal)   Pulse 80   Resp 13   Ht '5\' 2"'$  (1.575 m)   Wt 161 lb (73 kg)   BMI 29.45 kg/m    Physical Exam Vitals and nursing note reviewed.  Constitutional:      Appearance: She is well-developed.  HENT:     Head: Normocephalic and atraumatic.  Eyes:     Conjunctiva/sclera: Conjunctivae normal.  Pulmonary:     Effort: Pulmonary effort is normal.  Abdominal:     General: Bowel sounds are normal.     Palpations: Abdomen is soft.  Musculoskeletal:     Cervical back: Normal range of motion.  Lymphadenopathy:     Cervical: No cervical adenopathy.  Skin:    General: Skin is warm and dry.     Capillary Refill: Capillary refill takes 2 to 3 seconds.  Neurological:     Mental Status: She is alert and oriented to person, place, and time.  Psychiatric:        Behavior: Behavior normal.      Musculoskeletal Exam: C-spine, thoracic spine, lumbar spine good range of motion.  No midline spinal tenderness.  No SI joint tenderness.  Shoulder joints, elbow joints, wrist joints, MCPs, PIPs and DIPs good range of motion with no synovitis.  She has complete fist formation bilaterally.  Hip joints have good range of motion with no discomfort.  Knee joints have good range of motion no warmth or effusion.  Ankle joints have good  range of motion no tenderness or inflammation  CDAI Exam: CDAI Score: -- Patient Global: --; Provider Global: -- Swollen: --; Tender: -- Joint Exam 08/03/2019   No joint exam has been documented for this visit   There is currently no information documented on the homunculus. Go to the Rheumatology activity and complete the homunculus joint exam.  Investigation: No additional findings.  Imaging: No results found.  Recent Labs: Lab Results  Component Value Date   WBC 4.2 06/17/2019   HGB 13.6 06/17/2019   PLT 280 06/17/2019   NA 138 06/17/2019   K 4.3 06/17/2019   CL 105 06/17/2019   CO2 26 06/17/2019   GLUCOSE 85 06/17/2019   BUN 11 06/17/2019   CREATININE 0.90 06/17/2019   BILITOT 0.3 06/17/2019   ALKPHOS 34 11/13/2016   AST 16 06/17/2019   ALT 16 06/17/2019   PROT 8.2 (H) 06/17/2019   ALBUMIN 4.3 11/13/2016   CALCIUM 9.8 06/17/2019   GFRAA 104 06/17/2019    Speciality Comments: PLQ Eye Exam: 11/11/2018 WNL @ [facility] follow up in 1 year  Procedures:  No procedures performed Allergies: Patient has no known allergies.   Assessment / Plan:     Visit Diagnoses: Other systemic lupus erythematosus with other  organ involvement ([place]) - History of fatigue, weight loss, hair loss, malar rash, Raynauds, skin lupus, neutropenia, elevated sedimentation rate, positive ANA,ds DNA, Sm, Ro, low C3: She has not had any signs or symptoms of a systemic lupus flare recently.  She is clinically doing well on methotrexate 4 tablets by mouth once weekly, folic acid 1 mg by mouth daily, and Plaquenil 200 mg 1 tablet twice daily Monday through Friday.  She has not missed any doses of Plaquenil or methotrexate recently.  Lab work from 06/17/2019 was reviewed with the patient today in the office.  White blood cell count was 4.2, double-stranded DNA was 2, complements within normal limits, sed rate 17, UA was negative for protein.  She has not had any recent rashes.  No Maller rash was noted  on exam.  She has not had any hair loss recently.  We discussed the importance of wearing sunscreen SPF greater than 50 on a daily basis and reapplying every 2 hours.  Discussed the importance of avoiding direct sun exposure.  She has not had any symptoms of Raynaud's recently.  No digital ulcerations or signs of gangrene were noted.  She has not had any oral or nasal ulcerations.  She has not had any sicca symptoms.  She denies any palpitations, shortness of breath, or chest pain recently.  She will continue taking methotrexate 4 tablets by mouth once weekly, folic acid 1 mg by mouth daily, and Plaquenil 200 mg 1 tablet twice daily Monday through Friday.  She does not need any refills at this time.  She was advised to notify us if she develop signs or symptoms of a flare.  She will follow-up in the office in 5 months.- Plan: Urinalysis, Routine w reflex microscopic, Anti-DNA antibody, double-stranded, C3 and C4, Sedimentation rate  High risk medication use - MTX 4 tablets by mouth once weekly, folic acid 1 mg daily, PLQ 200 mg BID M-F.  PLQ eye exam: 11/11/2018.  CBC and CMP were drawn on 06/17/2019.  She will return for lab work in June and every 3 months to monitor for drug toxicity.  Standing orders are in place.  She has not had any recent infections.  She has received both COVID-19 vaccinations.  Raynaud's syndrome without gangrene: She has not had any signs or symptoms of Raynaud's recently.  No digital ulcerations or signs of gangrene.  She has capillary refill 2 to 3 seconds.  Discussed importance of avoiding triggers.  Knee joint stiffness, bilateral: She has been experiencing morning stiffness stiffness and discomfort in both knee joints lasting about 10 minutes.  She has not noticed any warmth or swelling recently.  She has good range of motion of both knee joints on exam.  No crepitus was noted.  No warmth or effusion noted.  She is not had any mechanical symptoms recently.  She has been performing  yoga exercises every morning which has been helping.  We discussed the importance of lower extremity muscle strengthening.  She was given a handout of knee joint exercises to perform.  We also discussed trying Voltaren gel topically as needed for pain relief.  Other decreased white blood cell (WBC) count: White blood cell count was 4.2 on 06/17/2019.  We will continue to monitor closely.  She will return for lab work in June and every 3 months.  Hair loss: She has not had any hair loss recently.  No signs of alopecia noted.  Vitamin D deficiency: She is taking vitamin D 2000 units  daily.  Her vitamin D was 62 on 08/17/2018.  Palpitations: She has not had any palpitations, shortness of breath, or chest pain recently.  Other insomnia: She has been sleeping well at night.  Other medical conditions are listed as follows:  History of asthma  Family history of lupus erythematosus  Orders: Orders Placed This Encounter  Procedures  . Urinalysis, Routine w reflex microscopic  . Anti-DNA antibody, double-stranded  . C3 and C4  . Sedimentation rate   No orders of the defined types were placed in this encounter.   Follow-Up Instructions: Return in about 5 months (around 01/03/2020) for Systemic lupus erythematosus.   Ofilia Neas, PA-C  Note - This record has been created using Dragon software.  Chart creation errors have been sought, but may not always  have been located. Such creation errors do not reflect on  the standard of medical care.

## 2019-08-03 ENCOUNTER — Encounter: Payer: Self-pay | Admitting: Physician Assistant

## 2019-08-03 ENCOUNTER — Other Ambulatory Visit: Payer: Self-pay

## 2019-08-03 ENCOUNTER — Ambulatory Visit: Payer: 59 | Admitting: Physician Assistant

## 2019-08-03 VITALS — BP 111/77 | HR 80 | Resp 13 | Ht 62.0 in | Wt 161.0 lb

## 2019-08-03 DIAGNOSIS — Z840 Family history of diseases of the skin and subcutaneous tissue: Secondary | ICD-10-CM

## 2019-08-03 DIAGNOSIS — E559 Vitamin D deficiency, unspecified: Secondary | ICD-10-CM

## 2019-08-03 DIAGNOSIS — D72818 Other decreased white blood cell count: Secondary | ICD-10-CM

## 2019-08-03 DIAGNOSIS — M25662 Stiffness of left knee, not elsewhere classified: Secondary | ICD-10-CM

## 2019-08-03 DIAGNOSIS — Z79899 Other long term (current) drug therapy: Secondary | ICD-10-CM

## 2019-08-03 DIAGNOSIS — I7300 Raynaud's syndrome without gangrene: Secondary | ICD-10-CM | POA: Diagnosis not present

## 2019-08-03 DIAGNOSIS — M3219 Other organ or system involvement in systemic lupus erythematosus: Secondary | ICD-10-CM | POA: Diagnosis not present

## 2019-08-03 DIAGNOSIS — L659 Nonscarring hair loss, unspecified: Secondary | ICD-10-CM

## 2019-08-03 DIAGNOSIS — M25661 Stiffness of right knee, not elsewhere classified: Secondary | ICD-10-CM

## 2019-08-03 DIAGNOSIS — G4709 Other insomnia: Secondary | ICD-10-CM

## 2019-08-03 DIAGNOSIS — Z8709 Personal history of other diseases of the respiratory system: Secondary | ICD-10-CM

## 2019-08-03 DIAGNOSIS — R002 Palpitations: Secondary | ICD-10-CM

## 2019-08-03 NOTE — Patient Instructions (Addendum)
Voltaren gel    Standing Labs We placed an order today for your standing lab work.    Please come back and get your standing labs in June and every 3 months   We have open lab daily Monday through Thursday from 8:30-12:30 PM and 1:30-4:30 PM and Friday from 8:30-12:30 PM and 1:30-4:00 PM at the office of Dr. Pollyann Savoy.   You may experience shorter wait times on Monday and Friday afternoons. The office is located at [address], [address], [place], [place] [address] No appointment is necessary.   Labs are drawn by [facility].  You may receive a bill from [facility] for your lab work.  If you wish to have your labs drawn at another location, please call the office 24 hours in advance to send orders.  If you have any questions regarding directions or hours of operation,  please call [phone].   Just as a reminder please drink plenty of water prior to coming for your lab work. Thanks!'    Journal for Nurse Practitioners, 15(4), [phone]. Retrieved January 04, 2018 from http://clinicalkey.com/nursing">  Knee Exercises Ask your health care provider which exercises are safe for you. Do exercises exactly as told by your health care provider and adjust them as directed. It is normal to feel mild stretching, pulling, tightness, or discomfort as you do these exercises. Stop right away if you feel sudden pain or your pain gets worse. Do not begin these exercises until told by your health care provider. Stretching and range-of-motion exercises These exercises warm up your muscles and joints and improve the movement and flexibility of your knee. These exercises also help to relieve pain and swelling. Knee extension, prone 1. Lie on your abdomen (prone position) on a bed. 2. Place your left / right knee just beyond the edge of the surface so your knee is not on the bed. You can put a towel under your left / right thigh just above your kneecap for comfort. 3. Relax your leg muscles and allow  gravity to straighten your knee (extension). You should feel a stretch behind your left / right knee. 4. Hold this position for __________ seconds. 5. Scoot up so your knee is supported between repetitions. Repeat __________ times. Complete this exercise __________ times a day. Knee flexion, active  1. Lie on your back with both legs straight. If this causes back discomfort, bend your left / right knee so your foot is flat on the floor. 2. Slowly slide your left / right heel back toward your buttocks. Stop when you feel a gentle stretch in the front of your knee or thigh (flexion). 3. Hold this position for __________ seconds. 4. Slowly slide your left / right heel back to the starting position. Repeat __________ times. Complete this exercise __________ times a day. Quadriceps stretch, prone  1. Lie on your abdomen on a firm surface, such as a bed or padded floor. 2. Bend your left / right knee and hold your ankle. If you cannot reach your ankle or pant leg, loop a belt around your foot and grab the belt instead. 3. Gently pull your heel toward your buttocks. Your knee should not slide out to the side. You should feel a stretch in the front of your thigh and knee (quadriceps). 4. Hold this position for __________ seconds. Repeat __________ times. Complete this exercise __________ times a day. Hamstring, supine 1. Lie on your back (supine position). 2. Loop a belt or towel over the ball of your  left / right foot. The ball of your foot is on the walking surface, right under your toes. 3. Straighten your left / right knee and slowly pull on the belt to raise your leg until you feel a gentle stretch behind your knee (hamstring). ? Do not let your knee bend while you do this. ? Keep your other leg flat on the floor. 4. Hold this position for __________ seconds. Repeat __________ times. Complete this exercise __________ times a day. Strengthening exercises These exercises build strength and  endurance in your knee. Endurance is the ability to use your muscles for a long time, even after they get tired. Quadriceps, isometric This exercise stretches the muscles in front of your thigh (quadriceps) without moving your knee joint (isometric). 1. Lie on your back with your left / right leg extended and your other knee bent. Put a rolled towel or small pillow under your knee if told by your health care provider. 2. Slowly tense the muscles in the front of your left / right thigh. You should see your kneecap slide up toward your hip or see increased dimpling just above the knee. This motion will push the back of the knee toward the floor. 3. For __________ seconds, hold the muscle as tight as you can without increasing your pain. 4. Relax the muscles slowly and completely. Repeat __________ times. Complete this exercise __________ times a day. Straight leg raises This exercise stretches the muscles in front of your thigh (quadriceps) and the muscles that move your hips (hip flexors). 1. Lie on your back with your left / right leg extended and your other knee bent. 2. Tense the muscles in the front of your left / right thigh. You should see your kneecap slide up or see increased dimpling just above the knee. Your thigh may even shake a bit. 3. Keep these muscles tight as you raise your leg 4-6 inches (10-15 cm) off the floor. Do not let your knee bend. 4. Hold this position for __________ seconds. 5. Keep these muscles tense as you lower your leg. 6. Relax your muscles slowly and completely after each repetition. Repeat __________ times. Complete this exercise __________ times a day. Hamstring, isometric 1. Lie on your back on a firm surface. 2. Bend your left / right knee about __________ degrees. 3. Dig your left / right heel into the surface as if you are trying to pull it toward your buttocks. Tighten the muscles in the back of your thighs (hamstring) to "dig" as hard as you can without  increasing any pain. 4. Hold this position for __________ seconds. 5. Release the tension gradually and allow your muscles to relax completely for __________ seconds after each repetition. Repeat __________ times. Complete this exercise __________ times a day. Hamstring curls If told by your health care provider, do this exercise while wearing ankle weights. Begin with __________ lb weights. Then increase the weight by 1 lb (0.5 kg) increments. Do not wear ankle weights that are more than __________ lb. 1. Lie on your abdomen with your legs straight. 2. Bend your left / right knee as far as you can without feeling pain. Keep your hips flat against the floor. 3. Hold this position for __________ seconds. 4. Slowly lower your leg to the starting position. Repeat __________ times. Complete this exercise __________ times a day. Squats This exercise strengthens the muscles in front of your thigh and knee (quadriceps). 1. Stand in front of a table, with your feet and knees  pointing straight ahead. You may rest your hands on the table for balance but not for support. 2. Slowly bend your knees and lower your hips like you are going to sit in a chair. ? Keep your weight over your heels, not over your toes. ? Keep your lower legs upright so they are parallel with the table legs. ? Do not let your hips go lower than your knees. ? Do not bend lower than told by your health care provider. ? If your knee pain increases, do not bend as low. 3. Hold the squat position for __________ seconds. 4. Slowly push with your legs to return to standing. Do not use your hands to pull yourself to standing. Repeat __________ times. Complete this exercise __________ times a day. Wall slides This exercise strengthens the muscles in front of your thigh and knee (quadriceps). 1. Lean your back against a smooth wall or door, and walk your feet out 18-24 inches (46-61 cm) from it. 2. Place your feet hip-width  apart. 3. Slowly slide down the wall or door until your knees bend __________ degrees. Keep your knees over your heels, not over your toes. Keep your knees in line with your hips. 4. Hold this position for __________ seconds. Repeat __________ times. Complete this exercise __________ times a day. Straight leg raises This exercise strengthens the muscles that rotate the leg at the hip and move it away from your body (hip abductors). 1. Lie on your side with your left / right leg in the top position. Lie so your head, shoulder, knee, and hip line up. You may bend your bottom knee to help you keep your balance. 2. Roll your hips slightly forward so your hips are stacked directly over each other and your left / right knee is facing forward. 3. Leading with your heel, lift your top leg 4-6 inches (10-15 cm). You should feel the muscles in your outer hip lifting. ? Do not let your foot drift forward. ? Do not let your knee roll toward the ceiling. 4. Hold this position for __________ seconds. 5. Slowly return your leg to the starting position. 6. Let your muscles relax completely after each repetition. Repeat __________ times. Complete this exercise __________ times a day. Straight leg raises This exercise stretches the muscles that move your hips away from the front of the pelvis (hip extensors). 1. Lie on your abdomen on a firm surface. You can put a pillow under your hips if that is more comfortable. 2. Tense the muscles in your buttocks and lift your left / right leg about 4-6 inches (10-15 cm). Keep your knee straight as you lift your leg. 3. Hold this position for __________ seconds. 4. Slowly lower your leg to the starting position. 5. Let your leg relax completely after each repetition. Repeat __________ times. Complete this exercise __________ times a day. This information is not intended to replace advice given to you by your health care provider. Make sure you discuss any questions you  have with your health care provider. Document Revised: 01/05/2018 Document Reviewed: 01/05/2018 Elsevier Patient Education  2020 [employer].

## 2019-09-15 ENCOUNTER — Other Ambulatory Visit: Payer: Self-pay | Admitting: Rheumatology

## 2019-09-15 DIAGNOSIS — M3219 Other organ or system involvement in systemic lupus erythematosus: Secondary | ICD-10-CM

## 2019-09-28 ENCOUNTER — Other Ambulatory Visit: Payer: Self-pay

## 2019-09-28 DIAGNOSIS — M3219 Other organ or system involvement in systemic lupus erythematosus: Secondary | ICD-10-CM

## 2019-09-28 DIAGNOSIS — Z79899 Other long term (current) drug therapy: Secondary | ICD-10-CM

## 2019-09-29 LAB — CBC WITH DIFFERENTIAL/PLATELET
Absolute Monocytes: 424 cells/uL (ref 200–950)
Basophils Absolute: 12 cells/uL (ref 0–200)
Basophils Relative: 0.3 %
Eosinophils Absolute: 52 cells/uL (ref 15–500)
Eosinophils Relative: 1.3 %
HCT: 39 % (ref 35.0–45.0)
Hemoglobin: 13 g/dL (ref 11.7–15.5)
Lymphs Abs: 1228 cells/uL (ref 850–3900)
MCH: 30.7 pg (ref 27.0–33.0)
MCHC: 33.3 g/dL (ref 32.0–36.0)
MCV: 92.2 fL (ref 80.0–100.0)
MPV: 9.8 fL (ref 7.5–12.5)
Monocytes Relative: 10.6 %
Neutro Abs: 2284 cells/uL (ref 1500–7800)
Neutrophils Relative %: 57.1 %
Platelets: 281 10*3/uL (ref 140–400)
RBC: 4.23 10*6/uL (ref 3.80–5.10)
RDW: 13.3 % (ref 11.0–15.0)
Total Lymphocyte: 30.7 %
WBC: 4 10*3/uL (ref 3.8–10.8)

## 2019-09-29 LAB — C3 AND C4
C3 Complement: 137 mg/dL (ref 83–193)
C4 Complement: 28 mg/dL (ref 15–57)

## 2019-09-29 LAB — URINALYSIS, ROUTINE W REFLEX MICROSCOPIC
Bilirubin Urine: NEGATIVE
Crystals: NONE SEEN /HPF
Glucose, UA: NEGATIVE
Hgb urine dipstick: NEGATIVE
Hyaline Cast: NONE SEEN /LPF
Ketones, ur: NEGATIVE
Nitrite: NEGATIVE
Protein, ur: NEGATIVE
RBC / HPF: NONE SEEN /HPF (ref 0–2)
Specific Gravity, Urine: 1.016 (ref 1.001–1.03)
pH: 6 (ref 5.0–8.0)

## 2019-09-29 LAB — COMPLETE METABOLIC PANEL WITH GFR
AG Ratio: 1.3 (calc) (ref 1.0–2.5)
ALT: 14 U/L (ref 6–29)
AST: 16 U/L (ref 10–30)
Albumin: 4.5 g/dL (ref 3.6–5.1)
Alkaline phosphatase (APISO): 41 U/L (ref 31–125)
BUN: 11 mg/dL (ref 7–25)
CO2: 28 mmol/L (ref 20–32)
Calcium: 9.7 mg/dL (ref 8.6–10.2)
Chloride: 105 mmol/L (ref 98–110)
Creat: 0.81 mg/dL (ref 0.50–1.10)
GFR, Est African American: 119 mL/min/{1.73_m2} (ref >=60)
GFR, Est Non African American: 102 mL/min/{1.73_m2} (ref >=60)
Globulin: 3.4 g/dL (calc) (ref 1.9–3.7)
Glucose, Bld: 84 mg/dL (ref 65–99)
Potassium: 4.1 mmol/L (ref 3.5–5.3)
Sodium: 138 mmol/L (ref 135–146)
Total Bilirubin: 0.3 mg/dL (ref 0.2–1.2)
Total Protein: 7.9 g/dL (ref 6.1–8.1)

## 2019-09-29 LAB — ANTI-DNA ANTIBODY, DOUBLE-STRANDED: ds DNA Ab: 3 IU/mL

## 2019-09-29 LAB — SEDIMENTATION RATE: Sed Rate: 9 mm/h (ref 0–20)

## 2019-09-29 NOTE — Progress Notes (Signed)
ESR WNL.  Complements WNL.  CBC and CMP WNL.  UA revealed 3+ leukocytes and few bacteria.  Negative for nitrites. Please notify the patient and advise her to follow up with PCP for further evaluation if she is experiencing signs or symptoms of a UTI.

## 2019-09-29 NOTE — Progress Notes (Signed)
DsDNA is negative.  Labs are not consistent with a flare at this time.  We will continue to monitor lab work closely.  Continue on current treatment regimen.

## 2019-10-05 ENCOUNTER — Other Ambulatory Visit: Payer: Self-pay | Admitting: *Deleted

## 2019-10-05 MED ORDER — METHOTREXATE 2.5 MG PO TABS: ORAL_TABLET | ORAL | 0 refills | Status: DC

## 2019-10-05 NOTE — Telephone Encounter (Signed)
Refill request received via fax  Last Visit: 08/03/2019 Next Visit: 01/04/2020 Labs: 09/28/2019 CBC and CMP WNL.   Current Dose per office note 08/03/2019: MTX 4 tablets by mouth once weekly DX: Other systemic lupus erythematosus with other organ involvement  Okay to refill per Dr. Corliss Skains

## 2019-11-07 ENCOUNTER — Telehealth: Payer: Self-pay | Admitting: Rheumatology

## 2019-11-07 DIAGNOSIS — M3219 Other organ or system involvement in systemic lupus erythematosus: Secondary | ICD-10-CM

## 2019-11-07 MED ORDER — HYDROXYCHLOROQUINE SULFATE 200 MG PO TABS: ORAL_TABLET | ORAL | 0 refills | Status: DC

## 2019-11-07 MED ORDER — FOLIC ACID 1 MG PO TABS: 1.0000 mg | ORAL_TABLET | Freq: Every day | ORAL | 1 refills | Status: DC

## 2019-11-07 NOTE — Telephone Encounter (Signed)
Patient request a refill on Generic Plaquenil, and possible Folic Acid sent to [facility].

## 2019-11-07 NOTE — Telephone Encounter (Signed)
Last Visit: 08/03/2019 Next Visit: 01/04/2020 Labs: 09/28/2019 CBC and CMP WNL PLQ Eye Exam: 11/11/2018 WNL  Current Dose per office note on 08/03/2019: PLQ 200 mg BID M-F. folic acid 1 mg daily  Okay to refill per Dr. Corliss Skains

## 2019-12-21 ENCOUNTER — Other Ambulatory Visit: Payer: Self-pay | Admitting: Rheumatology

## 2019-12-21 NOTE — Progress Notes (Signed)
Office Visit Note  Patient: Brittany Preston             Date of Birth: 09-07-1995           MRN: [id number]             PCP: Bartholome Bill, MD Referring: Bartholome Bill, MD Visit Date: 01/04/2020 Occupation: @GUAROCC @  Subjective:  Medication monitoring   History of Present Illness: Brittany Preston is a 24 y.o. female with history of systemic lupus erythematosus.  She is taking methotrexate 4 tablets by mouth once weekly, folic acid 1 mg by mouth daily, Plaquenil 200 mg 1 tablet twice daily Monday through Friday.  She denies any signs or symptoms of a flare at this time.  She experiences intermittent fatigue but has been sleeping well overall.  She experiences occasional stiffness in both knees if she is sitting for prolonged periods of time.  She denies any joint swelling or warmth.  She states that she recently purchased a stationary bike and plans on increasing her exercise regimen.  She denies any chest pain, palpitations, or shortness of breath.  She continues to experience intermittent symptoms of Raynaud's but but denies any digital ulcerations.  She states that she purchased a hand warmer which has been helpful.  She denies any recent rashes or increased photosensitivity.  She wears sunscreen on a daily basis and avoids direct sun exposure.  She denies any oral or nasal ulcerations.  She has not had any sicca symptoms.  She denies any enlarged lymph nodes. She has not had any recent infections.  She plans on receiving the third Covid 19 vaccine dose as well as the annual influenza vaccine this month.    Activities of Daily Living:  Patient reports morning stiffness for   none.   Patient Denies nocturnal pain.  Difficulty dressing/grooming: Denies Difficulty climbing stairs: Denies Difficulty getting out of chair: Reports Difficulty using hands for taps, buttons, cutlery, and/or writing: Denies  Review of Systems  Constitutional: Positive for fatigue.  HENT: Negative for  mouth dryness.   Eyes: Negative for dryness.  Respiratory: Negative for shortness of breath.   Cardiovascular: Negative for swelling in legs/feet.  Gastrointestinal: Negative for constipation.  Endocrine: Negative for increased urination.  Genitourinary: Negative for painful urination.  Musculoskeletal: Positive for arthralgias and joint pain.  Skin: Negative for rash.  Allergic/Immunologic: Negative for susceptible to infections.  Neurological: Negative for weakness.  Hematological: Negative for bruising/bleeding tendency.  Psychiatric/Behavioral: Negative for sleep disturbance.    PMFS History:  Patient Active Problem List   Diagnosis Date Noted  . Systemic lupus erythematosus ([facility]) 11/10/2016  . Vitamin D deficiency 11/06/2016  . Hair loss 10/14/2016  . Leukopenia 10/14/2016  . High risk medication use 10/14/2016  . Moderate persistent asthma without complication 80/99/8338    Past Medical History:  Diagnosis Date  . Asthma   . Lupus ([place]) 06/2016    History reviewed. No pertinent family history. History reviewed. No pertinent surgical history. Social History   Social History Narrative  . Not on file   Immunization History  Administered Date(s) Administered  . DTaP 03/04/1996, 06/27/1996, 10/07/1996, 07/07/1997, 01/03/2000  . HPV Quadrivalent 12/27/2008, 03/09/2009, 06/15/2009  . Hepatitis A 06/18/2005, 10/20/2006  . Hepatitis A, Ped/Adol-2 Dose 06/18/2005, 10/20/2006  . Hepatitis B 01/13/1996, 02/24/1996, 10/07/1996  . Hepatitis B, ped/adol 01/13/1996, 02/24/1996, 10/07/1996  . HiB (PRP-OMP) 03/04/1996, 06/27/1996, 10/07/1996, 07/07/1997  . HiB (PRP-T) 03/04/1996, 06/27/1996, 10/07/1996, 07/07/1997  . IPV 03/04/1996,  06/27/1996, 10/07/1996, 01/03/2000  . Influenza,inj,Quad PF,6+ Mos 12/10/2015  . Influenza-Unspecified 12/10/2015, 01/07/2017  . MMR 12/30/1996, 01/03/2000  . Meningococcal Conjugate 12/27/2007, 03/29/2013  . PFIZER SARS-COV-2 Vaccination  06/10/2019, 07/01/2019  . Pneumococcal Conjugate-13 01/03/2000  . Pneumococcal Polysaccharide-23 08/04/2016  . Tdap 10/20/2006  . Varicella 12/30/1996, 06/18/2005     Objective: Vital Signs: BP 103/66 (BP Location: Left Arm, Patient Position: Sitting, Cuff Size: Normal)   Pulse 86   Resp 14   Ht 5\' 2"  (1.575 m)   Wt 169 lb (76.7 kg)   BMI 30.91 kg/m    Physical Exam Vitals and nursing note reviewed.  Constitutional:      Appearance: She is well-developed.  HENT:     Head: Normocephalic and atraumatic.  Eyes:     Conjunctiva/sclera: Conjunctivae normal.  Pulmonary:     Effort: Pulmonary effort is normal.  Abdominal:     Palpations: Abdomen is soft.  Musculoskeletal:     Cervical back: Normal range of motion.  Skin:    General: Skin is warm and dry.     Capillary Refill: Capillary refill takes 2 to 3 seconds.  Neurological:     Mental Status: She is alert and oriented to person, place, and time.  Psychiatric:        Behavior: Behavior normal.      Musculoskeletal Exam: C-spine, thoracic spine, lumbar spine have good range of motion with no discomfort.  No midline spinal tenderness.  No SI joint tenderness.  Shoulder joints, elbow joints, wrist joints, MCPs, PIPs, DIPs have good range of motion with no synovitis.  She is able to make a complete fist bilaterally.  Hip joints have good range of motion with no discomfort.  No tenderness over trochanteric bursa bilaterally.  Knee joints have good range of motion with no warmth or effusion.  Ankle joints have good range of motion with no tenderness or inflammation.  CDAI Exam: CDAI Score: -- Patient Global: --; Provider Global: -- Swollen: --; Tender: -- Joint Exam 01/04/2020   No joint exam has been documented for this visit   There is currently no information documented on the homunculus. Go to the Rheumatology activity and complete the homunculus joint exam.  Investigation: No additional findings.  Imaging: No  results found.  Recent Labs: Lab Results  Component Value Date   WBC 4.0 09/28/2019   HGB 13.0 09/28/2019   PLT 281 09/28/2019   NA 138 09/28/2019   K 4.1 09/28/2019   CL 105 09/28/2019   CO2 28 09/28/2019   GLUCOSE 84 09/28/2019   BUN 11 09/28/2019   CREATININE 0.81 09/28/2019   BILITOT 0.3 09/28/2019   ALKPHOS 34 11/13/2016   AST 16 09/28/2019   ALT 14 09/28/2019   PROT 7.9 09/28/2019   ALBUMIN 4.3 11/13/2016   CALCIUM 9.7 09/28/2019   GFRAA 119 09/28/2019    Speciality Comments: PLQ Eye Exam: 11/17/2019 WNL @ [facility] follow up in 1 year  Procedures:  No procedures performed Allergies: Patient has no known allergies.   Assessment / Plan:     Visit Diagnoses: Other systemic lupus erythematosus with other organ involvement (HCC) - History of fatigue, weight loss, hair loss, malar rash, Raynauds, skin lupus, neutropenia, elevated sedimentation rate, positive ANA,ds DNA, Sm, Ro, low C3: She has not had any signs or symptoms of a systemic lupus flare.  She is clinically doing well on methotrexate 4 tablets by mouth once weekly, folic acid 1 mg by mouth daily, Plaquenil 200  mg 1 tablet by mouth twice daily Monday through Friday.  She is tolerating these medications without any side effects and has not missed any doses recently.  Lab work from 09/28/2019 was reviewed with the patient today in the office.  CBC and CMP were within normal limits, ESR and complements within normal limits, double-stranded DNA was negative. She has been experiencing some increased discomfort in both knee joints if she sits for prolonged periods of time but has not noticed any joint swelling.  She has good range of motion of both knee joints on examination today with no warmth or effusion.  She continues to experience intermittent symptoms of Raynaud's but no digital ulcerations or signs of gangrene were noted on exam.  Delayed capillary refill 2 to 3 seconds noted.  We discussed the importance of avoiding  triggers as well as keeping her core body temperature warm as well as wearing gloves and thick socks.  She has not had any recent rashes or increased photosensitivity.  She has been wearing sunscreen on a daily basis and avoiding direct sun exposure.  She denies any oral or nasal ulcerations recently.  She is not experiencing any sicca symptoms at this time.  She denies any chest pain, shortness of breath, or palpitations.  Her level fatigue has been stable and she has been sleeping well at night.  She recently purchased a stationary bike and plans on increasing her exercise regimen.  She will continue taking methotrexate, folic acid, Plaquenil as prescribed.  She does not need any refills at this time.  We will recheck the following lab work today.  She was advised to notify us if she develops signs or symptoms of a flare.  She will follow-up in the office in 5 months.- Plan: CBC with Differential/Platelet, COMPLETE METABOLIC PANEL WITH GFR, Anti-DNA antibody, double-stranded, Sedimentation rate, C3 and C4, Urinalysis, Routine w reflex microscopic  High risk medication use - MTX 4 tablets by mouth once weekly, folic acid 1 mg daily, PLQ 200 mg BID M-F. - Plan: CBC with Differential/Platelet, COMPLETE METABOLIC PANEL WITH GFR CBC and CMP were within normal limits on 09/28/2019.  She is due to update lab work today.  Orders for CBC and CMP were released.  She will require updated lab work every 3 months to monitor for drug toxicity.  Standing orders for CBC and CMP are in place.  PLQ Eye Exam: 11/17/2019 WNL @ [facility] follow up in 1 year.  She has received both COVID-19 vaccinations and was encouraged to receive the third dose.  She was advised to hold methotrexate 1 week after receiving a third dose.  She was also advised to hold Tylenol and NSAIDs 24 hours prior to the third dose.  She was advised to notify us or her PCP if she develops a COVID-19 infection in order to receive the antibody infusion.  She  voiced understanding.    Raynaud's syndrome without gangrene: She has intermittent symptoms of Raynaud's.  No digital ulcerations or signs of gangrene noted.  No skin tightness or thickening noted.  Delayed capillary refill 2-3 seconds noted on exam today.  We discussed the importance of avoiding triggers, wearing gloves/socks, and drinking warm fluids. She was advised to notify us if she develops any new or worsening symptoms.   Knee joint stiffness, bilateral: She continues to experience intermittent stiffness in both knee joints especially after sitting for prolonged periods of time.  She has good range of motion in both knee joints on  examination today with no warmth or effusion.  She has no difficulty climbing steps.  She is not experiencing any mechanical symptoms at this time.  She recently purchased a stationary bike and plans on increasing her exercise regimen.  Other decreased white blood cell (WBC) count: White blood cell count was 4.0 on 09/01/2019.  We will recheck CBC with differential today.  We will continue to monitor lab work closely.  Hair loss: Resolved.   Vitamin D deficiency: She is taking vitamin D 2,000 units daily.  Vitamin D was 62 on 08/17/18.   Other insomnia: She has been sleeping well at night.  She was encouraged to try to sleep at least 8 hours per night.   Palpitations: No recurrence.  She has not had any chest pain, palpitations, or shortness of breath.   Other medical conditions are listed as follows:   History of asthma  Family history of lupus erythematosus  Orders: Orders Placed This Encounter  Procedures  . CBC with Differential/Platelet  . COMPLETE METABOLIC PANEL WITH GFR  . Anti-DNA antibody, double-stranded  . Sedimentation rate  . C3 and C4  . Urinalysis, Routine w reflex microscopic   No orders of the defined types were placed in this encounter.    Follow-Up Instructions: Return in about 5 months (around 06/03/2020) for Systemic lupus  erythematosus.   Ofilia Neas, PA-C  Note - This record has been created using Dragon software.  Chart creation errors have been sought, but may not always  have been located. Such creation errors do not reflect on  the standard of medical care.

## 2019-12-21 NOTE — Telephone Encounter (Signed)
Please notify the patient that she is due to update lab work this month.

## 2019-12-21 NOTE — Telephone Encounter (Signed)
Patient has an appointment 01/04/2020. Patient to update at that appointment. Labs are pending for appointment.

## 2019-12-21 NOTE — Telephone Encounter (Signed)
Last Visit: 08/03/2019 Next Visit: 01/04/2020 Labs: 09/28/2019 CBC and CMP WNL.  Current Dose per office note 08/03/2019: Other systemic lupus erythematosus with other organ involvement DX: Methotrexate 4 tablets by mouth once weekly  Okay to refill Methotrexate?

## 2020-01-04 ENCOUNTER — Other Ambulatory Visit: Payer: Self-pay

## 2020-01-04 ENCOUNTER — Ambulatory Visit: Payer: 59 | Admitting: Physician Assistant

## 2020-01-04 ENCOUNTER — Encounter: Payer: Self-pay | Admitting: Physician Assistant

## 2020-01-04 VITALS — BP 103/66 | HR 86 | Resp 14 | Ht 62.0 in | Wt 169.0 lb

## 2020-01-04 DIAGNOSIS — E559 Vitamin D deficiency, unspecified: Secondary | ICD-10-CM

## 2020-01-04 DIAGNOSIS — M25661 Stiffness of right knee, not elsewhere classified: Secondary | ICD-10-CM

## 2020-01-04 DIAGNOSIS — G4709 Other insomnia: Secondary | ICD-10-CM

## 2020-01-04 DIAGNOSIS — Z840 Family history of diseases of the skin and subcutaneous tissue: Secondary | ICD-10-CM

## 2020-01-04 DIAGNOSIS — Z8709 Personal history of other diseases of the respiratory system: Secondary | ICD-10-CM

## 2020-01-04 DIAGNOSIS — M3219 Other organ or system involvement in systemic lupus erythematosus: Secondary | ICD-10-CM | POA: Diagnosis not present

## 2020-01-04 DIAGNOSIS — D72818 Other decreased white blood cell count: Secondary | ICD-10-CM

## 2020-01-04 DIAGNOSIS — R002 Palpitations: Secondary | ICD-10-CM

## 2020-01-04 DIAGNOSIS — M25662 Stiffness of left knee, not elsewhere classified: Secondary | ICD-10-CM

## 2020-01-04 DIAGNOSIS — Z79899 Other long term (current) drug therapy: Secondary | ICD-10-CM | POA: Diagnosis not present

## 2020-01-04 DIAGNOSIS — L659 Nonscarring hair loss, unspecified: Secondary | ICD-10-CM

## 2020-01-04 DIAGNOSIS — I7300 Raynaud's syndrome without gangrene: Secondary | ICD-10-CM | POA: Diagnosis not present

## 2020-01-04 NOTE — Patient Instructions (Addendum)
COVID-19 vaccine recommendations:   COVID-19 vaccine is recommended for everyone (unless you are allergic to a vaccine component), even if you are on a medication that suppresses your immune system.   If you are on Methotrexate, Cellcept (mycophenolate), Rinvoq, Xeljanz, and Olumiant- hold the medication for 1 week after each vaccine. Hold Methotrexate for 2 weeks after the single dose COVID-19 vaccine.   If you are on Orencia subcutaneous injection - hold medication one week prior to and one week after the first COVID-19 vaccine dose (only).   If you are on Orencia IV infusions- time vaccination administration so that the first COVID-19 vaccination will occur four weeks after the infusion and postpone the subsequent infusion by one week.   If you are on Cyclophosphamide or Rituxan infusions please contact your doctor prior to receiving the COVID-19 vaccine.   Do not take Tylenol or any anti-inflammatory medications (NSAIDs) 24 hours prior to the COVID-19 vaccination.   There is no direct evidence about the efficacy of the COVID-19 vaccine in individuals who are on medications that suppress the immune system.   Even if you are fully vaccinated, and you are on any medications that suppress your immune system, please continue to wear a mask, maintain at least six feet social distance and practice hand hygiene.   If you develop a COVID-19 infection, please contact your PCP or our office to determine if you need antibody infusion.  The booster vaccine is now available for immunocompromised patients. It is advised that if you had Pfizer vaccine you should get Pfizer booster.  If you had a Moderna vaccine then you should get a Moderna booster. Johnson and [employer] does not have a booster vaccine at this time.  Please see the following web sites for updated information.    https://[email or web address]/[email or web address]/0/Files/COVID-19-Vaccination-Patient-Resources.pdf  [email or web address]-[email or web address]/1159  Standing Labs We placed an order today for your standing lab work.   Please have your standing labs drawn in January and every 3 months   If possible, please have your labs drawn 2 weeks prior to your appointment so that the provider can discuss your results at your appointment.  We have open lab daily Monday through Thursday from 8:30-12:30 PM and 1:30-4:30 PM and Friday from 8:30-12:30 PM and 1:30-4:00 PM at the office of Dr. Shaili Deveshwar, [facility].   Please be advised, patients with office appointments requiring lab work will take precedents over walk-in lab work.  If possible, please come for your lab work on Monday and Friday afternoons, as you may experience shorter wait times. The office is located at [address], [address], [place], [place] [address] No appointment is necessary.   Labs are drawn by [facility]. Please bring your co-pay at the time of your lab draw.  You may receive a bill from [facility] for your lab work.  If you wish to have your labs drawn at another location, please call the office 24 hours in advance to send orders.  If you have any questions regarding directions or hours of operation,  please call [phone].   As a reminder, please drink plenty of water prior to coming for your lab work. Thanks!   

## 2020-01-05 LAB — COMPLETE METABOLIC PANEL WITH GFR
AG Ratio: 1.2 (calc) (ref 1.0–2.5)
ALT: 12 U/L (ref 6–29)
AST: 16 U/L (ref 10–30)
Albumin: 4.2 g/dL (ref 3.6–5.1)
Alkaline phosphatase (APISO): 44 U/L (ref 31–125)
BUN: 10 mg/dL (ref 7–25)
CO2: 26 mmol/L (ref 20–32)
Calcium: 9.7 mg/dL (ref 8.6–10.2)
Chloride: 104 mmol/L (ref 98–110)
Creat: 0.75 mg/dL (ref 0.50–1.10)
GFR, Est African American: 129 mL/min/{1.73_m2} (ref >=60)
GFR, Est Non African American: 112 mL/min/{1.73_m2} (ref >=60)
Globulin: 3.4 g/dL (calc) (ref 1.9–3.7)
Glucose, Bld: 88 mg/dL (ref 65–99)
Potassium: 4.2 mmol/L (ref 3.5–5.3)
Sodium: 140 mmol/L (ref 135–146)
Total Bilirubin: 0.3 mg/dL (ref 0.2–1.2)
Total Protein: 7.6 g/dL (ref 6.1–8.1)

## 2020-01-05 LAB — URINALYSIS, ROUTINE W REFLEX MICROSCOPIC
Bacteria, UA: NONE SEEN /HPF
Bilirubin Urine: NEGATIVE
Glucose, UA: NEGATIVE
Hgb urine dipstick: NEGATIVE
Hyaline Cast: NONE SEEN /LPF
Ketones, ur: NEGATIVE
Nitrite: NEGATIVE
Protein, ur: NEGATIVE
Specific Gravity, Urine: 1.014 (ref 1.001–1.03)
pH: 6 (ref 5.0–8.0)

## 2020-01-05 LAB — CBC WITH DIFFERENTIAL/PLATELET
Absolute Monocytes: 461 cells/uL (ref 200–950)
Basophils Absolute: 20 cells/uL (ref 0–200)
Basophils Relative: 0.4 %
Eosinophils Absolute: 108 cells/uL (ref 15–500)
Eosinophils Relative: 2.2 %
HCT: 37.9 % (ref 35.0–45.0)
Hemoglobin: 12.6 g/dL (ref 11.7–15.5)
Lymphs Abs: 1264 cells/uL (ref 850–3900)
MCH: 31 pg (ref 27.0–33.0)
MCHC: 33.2 g/dL (ref 32.0–36.0)
MCV: 93.1 fL (ref 80.0–100.0)
MPV: 9.7 fL (ref 7.5–12.5)
Monocytes Relative: 9.4 %
Neutro Abs: 3048 cells/uL (ref 1500–7800)
Neutrophils Relative %: 62.2 %
Platelets: 299 10*3/uL (ref 140–400)
RBC: 4.07 10*6/uL (ref 3.80–5.10)
RDW: 13 % (ref 11.0–15.0)
Total Lymphocyte: 25.8 %
WBC: 4.9 10*3/uL (ref 3.8–10.8)

## 2020-01-05 LAB — C3 AND C4
C3 Complement: 132 mg/dL (ref 83–193)
C4 Complement: 29 mg/dL (ref 15–57)

## 2020-01-05 LAB — ANTI-DNA ANTIBODY, DOUBLE-STRANDED: ds DNA Ab: 2 IU/mL

## 2020-01-05 LAB — SEDIMENTATION RATE: Sed Rate: 11 mm/h (ref 0–20)

## 2020-01-05 NOTE — Progress Notes (Signed)
DsDNA is negative.  Lab work is not consistent with a flare.  No change in therapy recommended.

## 2020-01-05 NOTE — Progress Notes (Signed)
CBC and CMP WNL.  ESR WNL.  Complements WNL.  UA revealed 2+ leukocytes.  Negative for nitrites.  If she develops symptoms of a UTI please advise the patient to schedule an appointment with her PCP for further evaluation.

## 2020-01-08 ENCOUNTER — Other Ambulatory Visit: Payer: Self-pay | Admitting: Rheumatology

## 2020-01-08 DIAGNOSIS — M3219 Other organ or system involvement in systemic lupus erythematosus: Secondary | ICD-10-CM

## 2020-01-20 ENCOUNTER — Telehealth: Payer: Self-pay | Admitting: Rheumatology

## 2020-01-20 NOTE — Telephone Encounter (Signed)
Patient request a call back in reference to medication she is currently on, and the Booster. Please call to advise.

## 2020-01-20 NOTE — Telephone Encounter (Signed)
Contacted patient and reviewed the following  COVID-19 vaccine recommendations:   COVID-19 vaccine is recommended for everyone (unless you are allergic to a vaccine component), even if you are on a medication that suppresses your immune system.   If you are on Methotrexate, Cellcept (mycophenolate), Rinvoq, Harriette Ohara, and Olumiant- hold the medication for 1 week after each vaccine. Hold Methotrexate for 2 weeks after the single dose COVID-19 vaccine.   If you are on Orencia subcutaneous injection - hold medication one week prior to and one week after the first COVID-19 vaccine dose (only).   If you are on Orencia IV infusions- time vaccination administration so that the first COVID-19 vaccination will occur four weeks after the infusion and postpone the subsequent infusion by one week.   If you are on Cyclophosphamide or Rituxan infusions please contact your doctor prior to receiving the COVID-19 vaccine.   Do not take Tylenol or any anti-inflammatory medications (NSAIDs) 24 hours prior to the COVID-19 vaccination.   There is no direct evidence about the efficacy of the COVID-19 vaccine in individuals who are on medications that suppress the immune system.   Even if you are fully vaccinated, and you are on any medications that suppress your immune system, please continue to wear a mask, maintain at least six feet social distance and practice hand hygiene.   If you develop a COVID-19 infection, please contact your PCP or our office to determine if you need antibody infusion.  The booster vaccine is now available for immunocompromised patients. It is advised that if you had Pfizer vaccine you should get [employer] booster.  If you had a Moderna vaccine then you should get a Moderna booster. [employer] does not have a booster vaccine at this time.  Please see the following web sites for updated information.    https://[email or web address]/[email or web address]/Files/COVID-19-Vaccination-Patient-Resources.pdf  [email or web address]-[email or web address]

## 2020-01-22 ENCOUNTER — Encounter: Payer: Self-pay | Admitting: Rheumatology

## 2020-01-23 NOTE — Telephone Encounter (Signed)
Reviewed the patients message/picture. I called the patient to discuss the oral ulcer she has noticed.  She states the ulcer was painful yesterday after eating spicy food but the pain has subsided today.  She denies any other oral ulcers or nasal ulcers.  She denies any changes in diet as a trigger.  She has been under some increased stress working overtime for work the past week or so.   Please pend a prescription for magic mouthwash.  I also recommended using oral gel OTC for symptomatic relief.   She is not having any other signs or symptoms of a systemic lupus flare. She has not missed any doses of PLQ or MTX recently.  She has been taking folic acid as prescribed. Lab work from 01/04/20 was reviewed and was not consistent with a flare at that time.  She was advised to notify us if her symptoms persist or worsen.   She is planning on receiving her covid-19 vaccine booster.

## 2020-02-09 ENCOUNTER — Other Ambulatory Visit: Payer: Self-pay | Admitting: Rheumatology

## 2020-02-09 DIAGNOSIS — M3219 Other organ or system involvement in systemic lupus erythematosus: Secondary | ICD-10-CM

## 2020-02-09 NOTE — Telephone Encounter (Signed)
Last Visit: 01/04/2020 Next Visit: 06/04/2020 Labs: 01/04/2020 CBC and CMP WNL. Eye exam: 11/17/2019 WNL  Current Dose per office note 01/04/2020: Plaquenil 200 mg 1 tablet by mouth twice daily Monday through Friday DX: Other systemic lupus erythematosus with other organ involvement   Okay to refill per Dr. Corliss Skains

## 2020-04-11 ENCOUNTER — Other Ambulatory Visit: Payer: Self-pay | Admitting: Rheumatology

## 2020-04-11 DIAGNOSIS — M3219 Other organ or system involvement in systemic lupus erythematosus: Secondary | ICD-10-CM

## 2020-04-11 NOTE — Telephone Encounter (Signed)
Last Visit: 01/04/2020 Next Visit: 06/04/2020 Labs: 10/6/021, CBC and CMP WNL. ESR WNL. Complements WNL. UA revealed 2+ leukocytes. Negative for nitrites.   Eye exam: 11/17/2019 WNL  Current Dose per office note 01/04/2020, Plaquenil 200 mg 1 tablet by mouth twice daily Monday through Friday DX: Other systemic lupus erythematosus with other organ involvement   Okay to refill Plaquenil?

## 2020-04-24 ENCOUNTER — Other Ambulatory Visit: Payer: Self-pay | Admitting: Rheumatology

## 2020-04-24 ENCOUNTER — Other Ambulatory Visit: Payer: Self-pay | Admitting: *Deleted

## 2020-04-24 DIAGNOSIS — M3219 Other organ or system involvement in systemic lupus erythematosus: Secondary | ICD-10-CM

## 2020-04-24 DIAGNOSIS — Z79899 Other long term (current) drug therapy: Secondary | ICD-10-CM

## 2020-04-24 MED ORDER — METHOTREXATE 2.5 MG PO TABS: ORAL_TABLET | ORAL | 0 refills | Status: DC

## 2020-04-24 NOTE — Telephone Encounter (Signed)
Last Visit: 01/04/2020 Next Visit: 06/04/2020 Labs: 04/24/2020 PENDING, 01/04/2020, CBC and CMP WNL. ESR WNL. Complements WNL. UA revealed 2+ leukocytes. Negative for nitrites.   Current Dose per office note 01/04/2020, MTX 4 tablets by mouth once weekly, DX: Other systemic lupus erythematosus with other organ involvement   Okay to refill MTX?

## 2020-04-24 NOTE — Telephone Encounter (Signed)
Patient here for lab draw, but requests a refill on MTX sent to [facility] on [address].

## 2020-04-25 ENCOUNTER — Telehealth: Payer: Self-pay | Admitting: Rheumatology

## 2020-04-25 LAB — COMPLETE METABOLIC PANEL WITH GFR
AG Ratio: 1.4 (calc) (ref 1.0–2.5)
ALT: 13 U/L (ref 6–29)
AST: 15 U/L (ref 10–30)
Albumin: 4.4 g/dL (ref 3.6–5.1)
Alkaline phosphatase (APISO): 42 U/L (ref 31–125)
BUN: 11 mg/dL (ref 7–25)
CO2: 24 mmol/L (ref 20–32)
Calcium: 9.6 mg/dL (ref 8.6–10.2)
Chloride: 106 mmol/L (ref 98–110)
Creat: 0.86 mg/dL (ref 0.50–1.10)
GFR, Est African American: 110 mL/min/{1.73_m2} (ref >=60)
GFR, Est Non African American: 95 mL/min/{1.73_m2} (ref >=60)
Globulin: 3.2 g/dL (calc) (ref 1.9–3.7)
Glucose, Bld: 90 mg/dL (ref 65–99)
Potassium: 4.2 mmol/L (ref 3.5–5.3)
Sodium: 139 mmol/L (ref 135–146)
Total Bilirubin: 0.3 mg/dL (ref 0.2–1.2)
Total Protein: 7.6 g/dL (ref 6.1–8.1)

## 2020-04-25 LAB — CBC WITH DIFFERENTIAL/PLATELET
Absolute Monocytes: 417 cells/uL (ref 200–950)
Basophils Absolute: 12 cells/uL (ref 0–200)
Basophils Relative: 0.3 %
Eosinophils Absolute: 59 cells/uL (ref 15–500)
Eosinophils Relative: 1.5 %
HCT: 38.1 % (ref 35.0–45.0)
Hemoglobin: 12.9 g/dL (ref 11.7–15.5)
Lymphs Abs: 1420 cells/uL (ref 850–3900)
MCH: 31.4 pg (ref 27.0–33.0)
MCHC: 33.9 g/dL (ref 32.0–36.0)
MCV: 92.7 fL (ref 80.0–100.0)
MPV: 9.5 fL (ref 7.5–12.5)
Monocytes Relative: 10.7 %
Neutro Abs: 1993 cells/uL (ref 1500–7800)
Neutrophils Relative %: 51.1 %
Platelets: 277 10*3/uL (ref 140–400)
RBC: 4.11 10*6/uL (ref 3.80–5.10)
RDW: 13.2 % (ref 11.0–15.0)
Total Lymphocyte: 36.4 %
WBC: 3.9 10*3/uL (ref 3.8–10.8)

## 2020-04-25 MED ORDER — METHOTREXATE 2.5 MG PO TABS: 10.0000 mg | ORAL_TABLET | ORAL | 0 refills | Status: DC

## 2020-04-25 NOTE — Telephone Encounter (Signed)
MTX RX resent with sig, Brittany Preston for patient

## 2020-04-25 NOTE — Telephone Encounter (Signed)
Patient calling to let you know RX for MTX was sent in to correct pharmacy, [facility]. Pharmacy cannot fill it because they are stating they did not receive directions for RX. Please call to advise.

## 2020-04-25 NOTE — Progress Notes (Signed)
CBC and CMP WNL

## 2020-05-21 NOTE — Progress Notes (Signed)
Office Visit Note  Patient: Brittany Preston             Date of Birth: 22-Oct-1995           MRN: [id number]             PCP: Bartholome Bill, MD Referring: Bartholome Bill, MD Visit Date: 06/04/2020 Occupation: _0 @  Subjective:  Medication management.   History of Present Illness: Brittany Preston is a 25 y.o. female with a history of systemic lupus .  She states she has been doing well on BP combination of methotrexate and Plaquenil.  She has been tolerating medications well.  She denies any history of oral ulcers, nasal ulcers, malar rash, photosensitivity, sicca symptoms, inflammatory arthritis or lymphadenopathy.  She had very mild Raynaud's this winter.  Activities of Daily Living:  Patient reports morning stiffness for 0 minutes.   Patient Denies nocturnal pain.  Difficulty dressing/grooming: Denies Difficulty climbing stairs: Denies Difficulty getting out of chair: Denies Difficulty using hands for taps, buttons, cutlery, and/or writing: Denies  Review of Systems  Constitutional: Negative for fatigue, night sweats, weight gain and weight loss.  HENT: Negative for mouth sores, trouble swallowing, trouble swallowing, mouth dryness and nose dryness.   Eyes: Negative for pain, redness, itching, visual disturbance and dryness.  Respiratory: Negative for cough, hemoptysis, shortness of breath and difficulty breathing.   Cardiovascular: Negative for chest pain, palpitations, hypertension, irregular heartbeat and swelling in legs/feet.  Gastrointestinal: Negative for abdominal pain, blood in stool, constipation and diarrhea.  Endocrine: Negative for increased urination.  Genitourinary: Negative for painful urination and vaginal dryness.  Musculoskeletal: Negative for arthralgias, joint pain, joint swelling, myalgias, muscle weakness, morning stiffness, muscle tenderness and myalgias.  Skin: Negative for color change, rash, hair loss, redness, skin tightness, ulcers and  sensitivity to sunlight.  Allergic/Immunologic: Negative for susceptible to infections.  Neurological: Negative for dizziness, numbness, headaches, memory loss, night sweats and weakness.  Hematological: Negative for swollen glands.  Psychiatric/Behavioral: Negative for depressed mood, confusion and sleep disturbance. The patient is not nervous/anxious.     PMFS History:  Patient Active Problem List   Diagnosis Date Noted  . Systemic lupus erythematosus ([place]) 11/10/2016  . Vitamin D deficiency 11/06/2016  . Hair loss 10/14/2016  . Leukopenia 10/14/2016  . High risk medication use 10/14/2016  . Moderate persistent asthma without complication 66/29/4765    Past Medical History:  Diagnosis Date  . Asthma   . Lupus ([place]) 06/2016    History reviewed. No pertinent family history. History reviewed. No pertinent surgical history. Social History   Social History Narrative  . Not on file   Immunization History  Administered Date(s) Administered  . DTaP 03/04/1996, 06/27/1996, 10/07/1996, 07/07/1997, 01/03/2000  . HPV Quadrivalent 12/27/2008, 03/09/2009, 06/15/2009  . Hepatitis A 06/18/2005, 10/20/2006  . Hepatitis A, Ped/Adol-2 Dose 06/18/2005, 10/20/2006  . Hepatitis B 01/13/1996, 02/24/1996, 10/07/1996  . Hepatitis B, ped/adol 01/13/1996, 02/24/1996, 10/07/1996  . HiB (PRP-OMP) 03/04/1996, 06/27/1996, 10/07/1996, 07/07/1997  . HiB (PRP-T) 03/04/1996, 06/27/1996, 10/07/1996, 07/07/1997  . IPV 03/04/1996, 06/27/1996, 10/07/1996, 01/03/2000  . Influenza,inj,Quad PF,6+ Mos 12/10/2015  . Influenza-Unspecified 12/10/2015, 01/07/2017  . MMR 12/30/1996, 01/03/2000  . Meningococcal Conjugate 12/27/2007, 03/29/2013  . PFIZER(Purple Top)SARS-COV-2 Vaccination 06/10/2019, 07/01/2019, 01/23/2020  . Pneumococcal Conjugate-13 01/03/2000  . Pneumococcal Polysaccharide-23 08/04/2016  . Tdap 10/20/2006  . Varicella 12/30/1996, 06/18/2005     Objective: Vital Signs: BP 125/87 (BP Location:  Left Arm, Patient Position: Sitting, Cuff Size: Normal)  Pulse 91   Ht _0  (1.575 m)   Wt 173 lb 6.4 oz (78.7 kg)   BMI 31.72 kg/m    Physical Exam Vitals and nursing note reviewed.  Constitutional:      Appearance: She is well-developed and well-nourished.  HENT:     Head: Normocephalic and atraumatic.  Eyes:     Extraocular Movements: EOM normal.     Conjunctiva/sclera: Conjunctivae normal.  Cardiovascular:     Rate and Rhythm: Normal rate and regular rhythm.     Pulses: Intact distal pulses.     Heart sounds: Normal heart sounds.  Pulmonary:     Effort: Pulmonary effort is normal.     Breath sounds: Normal breath sounds.  Abdominal:     General: Bowel sounds are normal.     Palpations: Abdomen is soft.  Musculoskeletal:     Cervical back: Normal range of motion.  Lymphadenopathy:     Cervical: No cervical adenopathy.  Skin:    General: Skin is warm and dry.     Capillary Refill: Capillary refill takes less than 2 seconds.  Neurological:     Mental Status: She is alert and oriented to person, place, and time.  Psychiatric:        Mood and Affect: Mood and affect normal.        Behavior: Behavior normal.      Musculoskeletal Exam: C-spine, thoracic and lumbar spine with good range of motion.  Shoulder joints, elbow joints, wrist joints, MCPs PIPs and DIPs with good range of motion with no synovitis.  Hip joints, knee joints, ankles, MTPs and PIPs with good range of motion with no synovitis.  CDAI Exam: CDAI Score: -- Patient Global: --; Provider Global: -- Swollen: --; Tender: -- Joint Exam 06/04/2020   No joint exam has been documented for this visit   There is currently no information documented on the homunculus. Go to the Rheumatology activity and complete the homunculus joint exam.  Investigation: No additional findings.  Imaging: No results found.  Recent Labs: Lab Results  Component Value Date   WBC 3.9 04/24/2020   HGB 12.9 04/24/2020   PLT  277 04/24/2020   NA 139 04/24/2020   K 4.2 04/24/2020   CL 106 04/24/2020   CO2 24 04/24/2020   GLUCOSE 90 04/24/2020   BUN 11 04/24/2020   CREATININE 0.86 04/24/2020   BILITOT 0.3 04/24/2020   ALKPHOS 34 11/13/2016   AST 15 04/24/2020   ALT 13 04/24/2020   PROT 7.6 04/24/2020   ALBUMIN 4.3 11/13/2016   CALCIUM 9.6 04/24/2020   GFRAA 110 04/24/2020    Speciality Comments: PLQ Eye Exam: 11/17/2019 WNL @ [facility] follow up in 1 year  Procedures:  No procedures performed Allergies: Patient has no known allergies.   Assessment / Plan:     Visit Diagnoses: Other systemic lupus erythematosus with other organ involvement ([place]) - History of fatigue, weight loss, hair loss, malar rash, Raynauds, skin lupus, neutropenia, elevated sedimentation rate, positive ANA,ds DNA, Sm, Ro, low C3:  -She denies any history of oral ulcers, nasal ulcers, malar rash, Raynaud's, hair loss, lymphadenopathy or inflammatory arthritis.  She has been tolerating medications well and not experiencing any side effects.  We will check her autoimmune labs with her next blood work which is due in April.  Plan: Urinalysis, Routine w reflex microscopic, ANA, Anti-DNA antibody, double-stranded, C3 and C4, Sedimentation rate  High risk medication use - MTX 4 tablets by mouth once  weekly, folic acid 1 mg daily, PLQ 200 mg BID M-F. PLQ Eye Exam: 11/17/2019.  CBC with differential and CMP were normal in January 2022.  She is not sexually active and is not using any contraception.  She is aware about the risk of methotrexate and fetal toxicity.  Raynaud's syndrome without gangrene-currently not active.  Keeping core temperature warm was discussed.  Knee joint stiffness, bilateral-she had no warmth or swelling on examination today.  Other decreased white blood cell (WBC) count-her white cell count has improved over time.  Vitamin D deficiency-vitamin D was normal in May 2020.  She has been taking same dose of vitamin  D.  History of asthma-currently not very active.  Family history of lupus erythematosus  Educated about COVID-19 virus infection-she is fully vaccinated against COVID-19 and also received a third dose in October.  She has been advised to get a fourth dose 6 months after the third dose.  Use of mask, social distancing and hand hygiene was discussed.  Increased risk of heart disease with autoimmune disease was also discussed.  Need for regular exercise and healthy diet was emphasized.  Handout was placed in the AVS.  Orders: Orders Placed This Encounter  Procedures  . Urinalysis, Routine w reflex microscopic  . ANA  . Anti-DNA antibody, double-stranded  . C3 and C4  . Sedimentation rate   No orders of the defined types were placed in this encounter.     Follow-Up Instructions: Return in about 4 months (around 10/04/2020) for Systemic lupus.   Bo Merino, MD  Note - This record has been created using Editor, commissioning.  Chart creation errors have been sought, but may not always  have been located. Such creation errors do not reflect on  the standard of medical care.

## 2020-06-04 ENCOUNTER — Ambulatory Visit: Payer: 59 | Admitting: Rheumatology

## 2020-06-04 ENCOUNTER — Encounter: Payer: Self-pay | Admitting: Rheumatology

## 2020-06-04 ENCOUNTER — Other Ambulatory Visit: Payer: Self-pay

## 2020-06-04 VITALS — BP 125/87 | HR 91 | Ht 62.0 in | Wt 173.4 lb

## 2020-06-04 DIAGNOSIS — I7300 Raynaud's syndrome without gangrene: Secondary | ICD-10-CM | POA: Diagnosis not present

## 2020-06-04 DIAGNOSIS — M25662 Stiffness of left knee, not elsewhere classified: Secondary | ICD-10-CM

## 2020-06-04 DIAGNOSIS — M3219 Other organ or system involvement in systemic lupus erythematosus: Secondary | ICD-10-CM

## 2020-06-04 DIAGNOSIS — Z8709 Personal history of other diseases of the respiratory system: Secondary | ICD-10-CM

## 2020-06-04 DIAGNOSIS — Z79899 Other long term (current) drug therapy: Secondary | ICD-10-CM

## 2020-06-04 DIAGNOSIS — R002 Palpitations: Secondary | ICD-10-CM

## 2020-06-04 DIAGNOSIS — Z840 Family history of diseases of the skin and subcutaneous tissue: Secondary | ICD-10-CM

## 2020-06-04 DIAGNOSIS — G4709 Other insomnia: Secondary | ICD-10-CM

## 2020-06-04 DIAGNOSIS — Z7189 Other specified counseling: Secondary | ICD-10-CM

## 2020-06-04 DIAGNOSIS — M25661 Stiffness of right knee, not elsewhere classified: Secondary | ICD-10-CM

## 2020-06-04 DIAGNOSIS — D72818 Other decreased white blood cell count: Secondary | ICD-10-CM

## 2020-06-04 DIAGNOSIS — E559 Vitamin D deficiency, unspecified: Secondary | ICD-10-CM

## 2020-06-04 DIAGNOSIS — L659 Nonscarring hair loss, unspecified: Secondary | ICD-10-CM

## 2020-06-04 NOTE — Patient Instructions (Signed)
Standing Labs We placed an order today for your standing lab work.   Please have your standing labs drawn in April and every 3 months  If possible, please have your labs drawn 2 weeks prior to your appointment so that the provider can discuss your results at your appointment.  We have open lab daily Monday through Thursday from 1:30-4:30 PM and Friday from 1:30-4:00 PM at the office of Dr. Pollyann Savoy, [facility].   Please be advised, all patients with office appointments requiring lab work will take precedents over walk-in lab work.  If possible, please come for your lab work on Monday and Friday afternoons, as you may experience shorter wait times. The office is located at [address], [place], [place] [address] No appointment is necessary.   Labs are drawn by [facility]. Please bring your co-pay at the time of your lab draw.  You may receive a bill from [facility] for your lab work.  If you wish to have your labs drawn at another location, please call the office 24 hours in advance to send orders.  If you have any questions regarding directions or hours of operation,  please call [phone].   As a reminder, please drink plenty of water prior to coming for your lab work. Thanks!   COVID-19 vaccine recommendations:   COVID-19 vaccine is recommended for everyone (unless you are allergic to a vaccine component), even if you are on a medication that suppresses your immune system.   If you are on Methotrexate, Cellcept (mycophenolate), Rinvoq, Harriette Ohara, and Olumiant- hold the medication for 1 week after each vaccine. Hold Methotrexate for 2 weeks after the single dose COVID-19 vaccine.   The recommendations are that individuals taking immunosuppressive agents should receive first 3 doses of COVID-19 vaccination 1 month apart and the fourth dose (booster) 6 months after the third dose.   Do not take Tylenol or any anti-inflammatory medications (NSAIDs) 24 hours  prior to the COVID-19 vaccination.   There is no direct evidence about the efficacy of the COVID-19 vaccine in individuals who are on medications that suppress the immune system.   Even if you are fully vaccinated, and you are on any medications that suppress your immune system, please continue to wear a mask, maintain at least six feet social distance and practice hand hygiene.   If you develop a COVID-19 infection, please contact your PCP or our office to determine if you need monoclonal antibody infusion.  The booster vaccine is now available for immunocompromised patients.   Please see the following web sites for updated information.   [email or web address]  Heart Disease Prevention   Your inflammatory disease increases your risk of heart disease which includes heart attack, stroke, atrial fibrillation (irregular heartbeats), high blood pressure, heart failure and atherosclerosis (plaque in the arteries).  It is important to reduce your risk by:   . Keep blood pressure, cholesterol, and blood sugar at healthy levels   . Smoking Cessation   . Maintain a healthy weight  o BMI 20-25   . Eat a healthy diet  o Plenty of fresh fruit, vegetables, and whole grains  o Limit saturated fats, foods high in sodium, and added sugars  o DASH and Mediterranean diet   . Increase physical activity  o Recommend moderate physically activity for 150 minutes per week/ 30 minutes a day for five days a week These can be broken up into three separate ten-minute sessions during the day.   . Reduce Stress  .  Meditation, slow breathing exercises, yoga, coloring books  . Dental visits twice a year

## 2020-06-26 ENCOUNTER — Other Ambulatory Visit: Payer: Self-pay | Admitting: Physician Assistant

## 2020-06-26 DIAGNOSIS — M3219 Other organ or system involvement in systemic lupus erythematosus: Secondary | ICD-10-CM

## 2020-06-26 NOTE — Telephone Encounter (Signed)
Last Visit: 06/04/2020 Next Visit: 10/08/2020 Labs: 04/24/2020, CBC and CMP WNL Eye exam: 11/17/2019  Current Dose per office note 3/72022, PLQ 200 mg BID M-F DX: Other systemic lupus erythematosus with other organ involvement Last Fill: 04/11/2020  Okay to refill Plaquenil?

## 2020-07-12 ENCOUNTER — Other Ambulatory Visit: Payer: Self-pay

## 2020-07-12 DIAGNOSIS — M3219 Other organ or system involvement in systemic lupus erythematosus: Secondary | ICD-10-CM

## 2020-07-12 DIAGNOSIS — Z79899 Other long term (current) drug therapy: Secondary | ICD-10-CM

## 2020-07-16 LAB — URINALYSIS, ROUTINE W REFLEX MICROSCOPIC
Bacteria, UA: NONE SEEN /HPF
Bilirubin Urine: NEGATIVE
Glucose, UA: NEGATIVE
Hgb urine dipstick: NEGATIVE
Hyaline Cast: NONE SEEN /LPF
Ketones, ur: NEGATIVE
Nitrite: NEGATIVE
Protein, ur: NEGATIVE
Specific Gravity, Urine: 1.015 (ref 1.001–1.03)
pH: 6.5 (ref 5.0–8.0)

## 2020-07-16 LAB — CBC WITH DIFFERENTIAL/PLATELET
Absolute Monocytes: 572 cells/uL (ref 200–950)
Basophils Absolute: 9 cells/uL (ref 0–200)
Basophils Relative: 0.2 %
Eosinophils Absolute: 59 cells/uL (ref 15–500)
Eosinophils Relative: 1.3 %
HCT: 40.1 % (ref 35.0–45.0)
Hemoglobin: 13.3 g/dL (ref 11.7–15.5)
Lymphs Abs: 1125 cells/uL (ref 850–3900)
MCH: 31 pg (ref 27.0–33.0)
MCHC: 33.2 g/dL (ref 32.0–36.0)
MCV: 93.5 fL (ref 80.0–100.0)
MPV: 9.8 fL (ref 7.5–12.5)
Monocytes Relative: 12.7 %
Neutro Abs: 2736 cells/uL (ref 1500–7800)
Neutrophils Relative %: 60.8 %
Platelets: 279 10*3/uL (ref 140–400)
RBC: 4.29 10*6/uL (ref 3.80–5.10)
RDW: 13.1 % (ref 11.0–15.0)
Total Lymphocyte: 25 %
WBC: 4.5 10*3/uL (ref 3.8–10.8)

## 2020-07-16 LAB — COMPLETE METABOLIC PANEL WITH GFR
AG Ratio: 1.2 (calc) (ref 1.0–2.5)
ALT: 16 U/L (ref 6–29)
AST: 18 U/L (ref 10–30)
Albumin: 4.6 g/dL (ref 3.6–5.1)
Alkaline phosphatase (APISO): 46 U/L (ref 31–125)
BUN: 10 mg/dL (ref 7–25)
CO2: 27 mmol/L (ref 20–32)
Calcium: 9.8 mg/dL (ref 8.6–10.2)
Chloride: 104 mmol/L (ref 98–110)
Creat: 0.79 mg/dL (ref 0.50–1.10)
GFR, Est African American: 121 mL/min/{1.73_m2} (ref >=60)
GFR, Est Non African American: 105 mL/min/{1.73_m2} (ref >=60)
Globulin: 3.7 g/dL (calc) (ref 1.9–3.7)
Glucose, Bld: 74 mg/dL (ref 65–99)
Potassium: 4.3 mmol/L (ref 3.5–5.3)
Sodium: 137 mmol/L (ref 135–146)
Total Bilirubin: 0.3 mg/dL (ref 0.2–1.2)
Total Protein: 8.3 g/dL — ABNORMAL HIGH (ref 6.1–8.1)

## 2020-07-16 LAB — ANTI-NUCLEAR AB-TITER (ANA TITER): ANA Titer 1: 1:320 {titer} — ABNORMAL HIGH

## 2020-07-16 LAB — C3 AND C4
C3 Complement: 145 mg/dL (ref 83–193)
C4 Complement: 31 mg/dL (ref 15–57)

## 2020-07-16 LAB — ANA: Anti Nuclear Antibody (ANA): POSITIVE — AB

## 2020-07-16 LAB — SEDIMENTATION RATE: Sed Rate: 14 mm/h (ref 0–20)

## 2020-07-16 LAB — ANTI-DNA ANTIBODY, DOUBLE-STRANDED: ds DNA Ab: 2 IU/mL

## 2020-07-16 NOTE — Progress Notes (Signed)
Total protein slightly elevated. Rest of CMP WNL.  CBC WNL.

## 2020-09-24 NOTE — Progress Notes (Signed)
Office Visit Note  Patient: Brittany Preston             Date of Birth: 17-Oct-1995           MRN: [id number]             PCP: Bartholome Bill, MD Referring: Bartholome Bill, MD Visit Date: 10/08/2020 Occupation: @GUAROCC @  Subjective:  Medication monitoring   History of Present Illness: Brittany Preston is a 25 y.o. female with history of systemic lupus erythematosus.  She is taking methotrexate 4 tablets by mouth once weekly, folic acid 1 mg daily, and plaquenil 200 mg 1 tablet by mouth twice daily Monday through Friday.  She eyes any signs or symptoms of a systemic lupus flare.  She denies any increased joint pain, joint stiffness, or joint swelling.  Her energy level has been stable.  She has some difficulty falling asleep so has noticed some increased daytime drowsiness.  She denies any fevers or swollen lymph nodes.  She has not had any recent rashes, photosensitivity, or symptoms of Raynaud's.  She has been trying to wear sunscreen on a regular basis.  She denies any oral or nasal ulcerations.  She has not had any sicca symptoms.  She denies any hair loss.  She denies any shortness of breath, pleuritic chest pain, or palpitations. She denies any recent infections.  She continues to take vitamin D 2,000 units daily.     Activities of Daily Living:  Patient reports morning stiffness for 0 minutes.   Patient Reports nocturnal pain.  Difficulty dressing/grooming: Denies Difficulty climbing stairs: Denies Difficulty getting out of chair: Denies Difficulty using hands for taps, buttons, cutlery, and/or writing: Denies  Review of Systems  Constitutional:  Negative for fatigue.  HENT:  Negative for mouth sores, mouth dryness and nose dryness.   Eyes:  Negative for pain, itching, visual disturbance and dryness.  Respiratory:  Negative for cough, hemoptysis, shortness of breath and difficulty breathing.   Cardiovascular:  Negative for chest pain, palpitations and swelling in  legs/feet.  Gastrointestinal:  Negative for abdominal pain, blood in stool, constipation and diarrhea.  Endocrine: Negative for increased urination.  Genitourinary:  Negative for painful urination.  Musculoskeletal:  Positive for joint pain and joint pain. Negative for joint swelling, myalgias, muscle weakness, morning stiffness, muscle tenderness and myalgias.  Skin:  Negative for color change, rash and redness.  Allergic/Immunologic: Negative for susceptible to infections.  Neurological:  Negative for dizziness, numbness, headaches, memory loss and weakness.  Hematological:  Negative for swollen glands.  Psychiatric/Behavioral:  Negative for confusion and sleep disturbance.    PMFS History:  Patient Active Problem List   Diagnosis Date Noted   Systemic lupus erythematosus ([place]) 11/10/2016   Vitamin D deficiency 11/06/2016   Hair loss 10/14/2016   Leukopenia 10/14/2016   High risk medication use 10/14/2016   Moderate persistent asthma without complication 46/27/0350    Past Medical History:  Diagnosis Date   Asthma    Lupus ([place]) 06/2016    History reviewed. No pertinent family history. History reviewed. No pertinent surgical history. Social History   Social History Narrative   Not on file   Immunization History  Administered Date(s) Administered   DTaP 03/04/1996, 06/27/1996, 10/07/1996, 07/07/1997, 01/03/2000   HPV Quadrivalent 12/27/2008, 03/09/2009, 06/15/2009   Hepatitis A 06/18/2005, 10/20/2006   Hepatitis A, Ped/Adol-2 Dose 06/18/2005, 10/20/2006   Hepatitis B 01/13/1996, 02/24/1996, 10/07/1996   Hepatitis B, ped/adol 01/13/1996, 02/24/1996, 10/07/1996   HiB (PRP-OMP)  03/04/1996, 06/27/1996, 10/07/1996, 07/07/1997   HiB (PRP-T) 03/04/1996, 06/27/1996, 10/07/1996, 07/07/1997   IPV 03/04/1996, 06/27/1996, 10/07/1996, 01/03/2000   Influenza,inj,Quad PF,6+ Mos 12/10/2015   Influenza-Unspecified 12/10/2015, 01/07/2017   MMR 12/30/1996, 01/03/2000   Meningococcal  Conjugate 12/27/2007, 03/29/2013   PFIZER(Purple Top)SARS-COV-2 Vaccination 06/10/2019, 07/01/2019, 01/23/2020   Pneumococcal Conjugate-13 01/03/2000   Pneumococcal Polysaccharide-23 08/04/2016   Tdap 10/20/2006   Varicella 12/30/1996, 06/18/2005     Objective: Vital Signs: BP 106/63 (BP Location: Left Arm, Patient Position: Sitting, Cuff Size: Normal)   Pulse 88   Ht $R'5\' 2"'Lm$  (1.575 m)   Wt 178 lb 3.2 oz (80.8 kg)   BMI 32.59 kg/m    Physical Exam Vitals and nursing note reviewed.  Constitutional:      Appearance: She is well-developed.  HENT:     Head: Normocephalic and atraumatic.  Eyes:     Conjunctiva/sclera: Conjunctivae normal.  Cardiovascular:     Rate and Rhythm: Normal rate and regular rhythm.     Heart sounds: Normal heart sounds.  Pulmonary:     Effort: Pulmonary effort is normal.     Breath sounds: Normal breath sounds.  Abdominal:     General: Bowel sounds are normal.     Palpations: Abdomen is soft.  Musculoskeletal:     Cervical back: Normal range of motion.  Lymphadenopathy:     Cervical: No cervical adenopathy.  Skin:    General: Skin is warm and dry.     Capillary Refill: Capillary refill takes less than 2 seconds.     Comments: No malar rash. No digital ulcerations or signs of gangrene noted.    Neurological:     Mental Status: She is alert and oriented to person, place, and time.  Psychiatric:        Behavior: Behavior normal.     Musculoskeletal Exam: C-spine, thoracic spine, lumbar spine have good range of motion with no discomfort.  Shoulder joints, elbow joints, wrist joints, MCPs, PIPs, DIPs have good range of motion with no synovitis.  Complete fist formation bilaterally.  Hip joints have good range of motion with no discomfort.  Knee joints have good range of motion with no warmth or effusion.  Ankle joints have good range of motion with no tenderness or joint swelling.  CDAI Exam: CDAI Score: -- Patient Global: --; Provider Global:  -- Swollen: --; Tender: -- Joint Exam 10/08/2020   No joint exam has been documented for this visit   There is currently no information documented on the homunculus. Go to the Rheumatology activity and complete the homunculus joint exam.  Investigation: No additional findings.  Imaging: No results found.  Recent Labs: Lab Results  Component Value Date   WBC 4.5 07/12/2020   HGB 13.3 07/12/2020   PLT 279 07/12/2020   NA 137 07/12/2020   K 4.3 07/12/2020   CL 104 07/12/2020   CO2 27 07/12/2020   GLUCOSE 74 07/12/2020   BUN 10 07/12/2020   CREATININE 0.79 07/12/2020   BILITOT 0.3 07/12/2020   ALKPHOS 34 11/13/2016   AST 18 07/12/2020   ALT 16 07/12/2020   PROT 8.3 (H) 07/12/2020   ALBUMIN 4.3 11/13/2016   CALCIUM 9.8 07/12/2020   GFRAA 121 07/12/2020    Speciality Comments: PLQ Eye Exam: 11/17/2019 WNL @ [facility] follow up in 1 year  Procedures:  No procedures performed Allergies: Patient has no known allergies.   Assessment / Plan:     Visit Diagnoses: Other systemic lupus erythematosus with other  organ involvement ([facility]) - History of fatigue, weight loss, hair loss, malar rash, Raynauds, skin lupus, neutropenia, elevated sedimentation rate, positive ANA,ds DNA, Sm, Ro, low C3: She has not had any signs or symptoms of a systemic lupus flare recently.  She is clinically doing well taking methotrexate 4 tablets by mouth once weekly, folic acid 1 mg by mouth daily, and Plaquenil 200 mg 1 tablet by mouth twice daily Monday through Friday.  She occasionally misses the evening dose of Plaquenil so we discussed the importance of staying compliant with all of her prescription medications.  She has not been experiencing any joint pain or stiffness.  No synovitis was noted on examination today.  She has not had any recent rashes, photosensitivity, or hair loss.  We discussed the importance of avoiding direct UV exposure as well as wearing sunscreen SPF greater than 50 on a daily  basis.  She has not had any symptoms of Raynaud's and no digital ulcerations or signs of gangrene were noted.  She has not had any pleuritic chest pain, shortness of breath, or palpitations.  Her energy level has been stable overall.  She has not noticed any unintentional weight loss, swollen lymph nodes, or fevers recently.  Lab work from 07/12/20 was reviewed today in the office: ANA 1:320NS, dsDNA negative, ESR WNL, and complement WNL.  She is due to update lab work today.  Orders released.  She will remain on methotrexate and Plaquenil as prescribed.  She does not need any refills at this time.  Her next lab work will be due in 3 months so future orders were placed today.  She was advised to notify us if she develops signs or symptoms of a flare.  She will follow-up in the office in 5 months.- Plan: CBC with Differential/Platelet, COMPLETE METABOLIC PANEL WITH GFR- Plan: Protein / creatinine ratio, urine, CBC with Differential/Platelet, COMPLETE METABOLIC PANEL WITH GFR, Anti-DNA antibody, double-stranded, C3 and C4, Sedimentation rate  High risk medication use -Methotrexate 4 tablets by mouth once weekly, folic acid 1 mg daily, and Plaquenil 200 mg 1 tablet by mouth BID M-F. PLQ Eye Exam: 11/17/2019.  She will be due to update Plaquenil eye exam in August 2022.  CBC and CMP drawn on 07/12/2020.  Orders for CBC and CMP were released today.  Her next lab work will be due in October and every 3 months to monitor for drug toxicity.  Standing orders for CBC and CMP remain in place. She has not had any recent infections.  We discussed the importance of holding methotrexate if she develops signs or symptoms of an infection and to resume once infection has completely cleared.  We discussed the interaction between methotrexate and sulfa antibiotics.  Raynaud's syndrome without gangrene: Not currently active.  No digital ulcerations or signs of gangrene were noted.  Knee joint stiffness, bilateral: She has good range  of motion in both knee joints with no discomfort or stiffness.  No warmth or effusion was noted.  Other decreased white blood cell (WBC) count -CBC with differential ordered today.  Plan: CBC with Differential/Platelet  Vitamin D deficiency: She continues to take vitamin D 2000 units daily.  Other medical conditions are listed as follows:  History of asthma  Family history of lupus erythematosus  Orders: Orders Placed This Encounter  Procedures   Protein / creatinine ratio, urine   CBC with Differential/Platelet   COMPLETE METABOLIC PANEL WITH GFR   Anti-DNA antibody, double-stranded   C3 and C4  Sedimentation rate   No orders of the defined types were placed in this encounter.   Follow-Up Instructions: Return in about 5 months (around 03/10/2021) for Systemic lupus erythematosus.   Ofilia Neas, PA-C  Note - This record has been created using Dragon software.  Chart creation errors have been sought, but may not always  have been located. Such creation errors do not reflect on  the standard of medical care.

## 2020-10-07 ENCOUNTER — Other Ambulatory Visit: Payer: Self-pay | Admitting: Physician Assistant

## 2020-10-07 DIAGNOSIS — M3219 Other organ or system involvement in systemic lupus erythematosus: Secondary | ICD-10-CM

## 2020-10-08 ENCOUNTER — Other Ambulatory Visit: Payer: Self-pay

## 2020-10-08 ENCOUNTER — Ambulatory Visit: Payer: 59 | Admitting: Physician Assistant

## 2020-10-08 ENCOUNTER — Encounter: Payer: Self-pay | Admitting: Physician Assistant

## 2020-10-08 VITALS — BP 106/63 | HR 88 | Ht 62.0 in | Wt 178.2 lb

## 2020-10-08 DIAGNOSIS — M25661 Stiffness of right knee, not elsewhere classified: Secondary | ICD-10-CM

## 2020-10-08 DIAGNOSIS — M3219 Other organ or system involvement in systemic lupus erythematosus: Secondary | ICD-10-CM | POA: Diagnosis not present

## 2020-10-08 DIAGNOSIS — Z79899 Other long term (current) drug therapy: Secondary | ICD-10-CM | POA: Diagnosis not present

## 2020-10-08 DIAGNOSIS — D72818 Other decreased white blood cell count: Secondary | ICD-10-CM

## 2020-10-08 DIAGNOSIS — I7300 Raynaud's syndrome without gangrene: Secondary | ICD-10-CM

## 2020-10-08 DIAGNOSIS — Z8709 Personal history of other diseases of the respiratory system: Secondary | ICD-10-CM

## 2020-10-08 DIAGNOSIS — M25662 Stiffness of left knee, not elsewhere classified: Secondary | ICD-10-CM

## 2020-10-08 DIAGNOSIS — E559 Vitamin D deficiency, unspecified: Secondary | ICD-10-CM

## 2020-10-08 DIAGNOSIS — Z840 Family history of diseases of the skin and subcutaneous tissue: Secondary | ICD-10-CM

## 2020-10-08 NOTE — Telephone Encounter (Signed)
Last Visit: 10/08/2020 Next Visit: 03/11/2021 Labs: 10/08/2020  pending results. 07/12/2020 Total protein slightly elevated. Rest of CMP WNL.  CBC WNL.  ANA remains positive.  ESR and complements WNL.  dsDNA is negative. UA 2+leukocytes.  Rest of UA normal.   Eye exam: 11/17/2019   Current Dose per office note on 10/08/2020: Plaquenil 200 mg 1 tablet by mouth BID M-F. WN:IOEVO systemic lupus erythematosus with other organ involvement  Last Fill: 06/26/2020  Okay to refill Plaquenil?

## 2020-10-09 LAB — CBC WITH DIFFERENTIAL/PLATELET
Absolute Monocytes: 331 cells/uL (ref 200–950)
Basophils Absolute: 11 cells/uL (ref 0–200)
Basophils Relative: 0.3 %
Eosinophils Absolute: 49 cells/uL (ref 15–500)
Eosinophils Relative: 1.3 %
HCT: 38.8 % (ref 35.0–45.0)
Hemoglobin: 12.6 g/dL (ref 11.7–15.5)
Lymphs Abs: 1360 cells/uL (ref 850–3900)
MCH: 30.3 pg (ref 27.0–33.0)
MCHC: 32.5 g/dL (ref 32.0–36.0)
MCV: 93.3 fL (ref 80.0–100.0)
MPV: 9.7 fL (ref 7.5–12.5)
Monocytes Relative: 8.7 %
Neutro Abs: 2048 cells/uL (ref 1500–7800)
Neutrophils Relative %: 53.9 %
Platelets: 289 10*3/uL (ref 140–400)
RBC: 4.16 10*6/uL (ref 3.80–5.10)
RDW: 13.1 % (ref 11.0–15.0)
Total Lymphocyte: 35.8 %
WBC: 3.8 10*3/uL (ref 3.8–10.8)

## 2020-10-09 LAB — PROTEIN / CREATININE RATIO, URINE
Creatinine, Urine: 154 mg/dL (ref 20–275)
Protein/Creat Ratio: 45 mg/g creat (ref 21–161)
Protein/Creatinine Ratio: 0.045 mg/mg creat (ref 0.021–0.161)
Total Protein, Urine: 7 mg/dL (ref 5–24)

## 2020-10-09 LAB — COMPLETE METABOLIC PANEL WITH GFR
AG Ratio: 1.4 (calc) (ref 1.0–2.5)
ALT: 14 U/L (ref 6–29)
AST: 17 U/L (ref 10–30)
Albumin: 4.3 g/dL (ref 3.6–5.1)
Alkaline phosphatase (APISO): 48 U/L (ref 31–125)
BUN: 10 mg/dL (ref 7–25)
CO2: 29 mmol/L (ref 20–32)
Calcium: 9.6 mg/dL (ref 8.6–10.2)
Chloride: 104 mmol/L (ref 98–110)
Creat: 0.85 mg/dL (ref 0.50–0.96)
Globulin: 3.1 g/dL (calc) (ref 1.9–3.7)
Glucose, Bld: 93 mg/dL (ref 65–99)
Potassium: 4.2 mmol/L (ref 3.5–5.3)
Sodium: 139 mmol/L (ref 135–146)
Total Bilirubin: 0.3 mg/dL (ref 0.2–1.2)
Total Protein: 7.4 g/dL (ref 6.1–8.1)
eGFR: 98 mL/min/{1.73_m2} (ref >=60)

## 2020-10-09 LAB — C3 AND C4
C3 Complement: 130 mg/dL (ref 83–193)
C4 Complement: 27 mg/dL (ref 15–57)

## 2020-10-09 LAB — SEDIMENTATION RATE: Sed Rate: 6 mm/h (ref 0–20)

## 2020-10-09 LAB — ANTI-DNA ANTIBODY, DOUBLE-STRANDED: ds DNA Ab: 4 IU/mL

## 2020-10-09 NOTE — Progress Notes (Signed)
CBC WNL

## 2020-10-10 NOTE — Progress Notes (Signed)
CMP WNL.  ESR and complements WNL.  dsDNA is negative. Protein creatinine ratio WNL.

## 2020-10-12 ENCOUNTER — Other Ambulatory Visit: Payer: Self-pay | Admitting: Rheumatology

## 2020-10-12 NOTE — Telephone Encounter (Signed)
Next Visit: 03/01/2021  Last Visit: 10/08/2020  Last Fill: 11/07/2019  Dx: Other systemic lupus erythematosus with other organ involvement  Current Dose per office note on 10/08/2020: folic acid 1 mg daily  Per protocol, okay to refill per Dr. Corliss Skains

## 2020-10-18 ENCOUNTER — Other Ambulatory Visit: Payer: Self-pay | Admitting: *Deleted

## 2020-10-18 MED ORDER — METHOTREXATE 2.5 MG PO TABS: 10.0000 mg | ORAL_TABLET | ORAL | 0 refills | Status: DC

## 2020-10-18 NOTE — Telephone Encounter (Signed)
Next Visit: 03/11/2021  Last Visit: 10/08/2020  Last Fill: 04/25/2020  DX: Other systemic lupus erythematosus with other organ involvement  Current Dose per office note 10/08/2020: Methotrexate 4 tablets by mouth once weekly  Labs: 10/08/2020, CMP WNL.  ESR and complements WNL.  dsDNA is negative. Protein creatinine ratioWNL.  Okay to refill MTX?

## 2020-12-19 ENCOUNTER — Telehealth: Payer: Self-pay

## 2020-12-19 NOTE — Telephone Encounter (Signed)
LMOM

## 2020-12-19 NOTE — Telephone Encounter (Signed)
Discussed with Dr. Corliss Skains.  She does not recommend testing for the patient since the UTI is not contagious.

## 2020-12-19 NOTE — Telephone Encounter (Signed)
Patient called stating she received a call from her veterinarian informing her that her cat has a UTI which carries E.coli bacteria.  She was told to let her doctor's know in case she needs to be tested.  Patient states she currently feels fine.

## 2021-01-09 ENCOUNTER — Other Ambulatory Visit: Payer: Self-pay | Admitting: Physician Assistant

## 2021-01-09 DIAGNOSIS — Z79899 Other long term (current) drug therapy: Secondary | ICD-10-CM

## 2021-01-10 NOTE — Telephone Encounter (Signed)
Next Visit: 03/11/2021   Last Visit: 10/08/2020   Last Fill: 10/18/2020   DX: Other systemic lupus erythematosus with other organ involvement   Current Dose per office note 10/08/2020: Methotrexate 4 tablets by mouth once weekly   Labs: 10/08/2020, CMP WNL.  ESR and complements WNL.  dsDNA is negative. Protein creatinine ratioWNL.  Patient advised she is due to update labs. Patient states she will come in on Monday to update.    Okay to refill MTX?

## 2021-01-14 ENCOUNTER — Other Ambulatory Visit: Payer: Self-pay

## 2021-01-14 DIAGNOSIS — Z79899 Other long term (current) drug therapy: Secondary | ICD-10-CM

## 2021-01-15 LAB — CBC WITH DIFFERENTIAL/PLATELET
Absolute Monocytes: 437 cells/uL (ref 200–950)
Basophils Absolute: 10 cells/uL (ref 0–200)
Basophils Relative: 0.2 %
Eosinophils Absolute: 29 cells/uL (ref 15–500)
Eosinophils Relative: 0.6 %
HCT: 39.1 % (ref 35.0–45.0)
Hemoglobin: 13 g/dL (ref 11.7–15.5)
Lymphs Abs: 1277 cells/uL (ref 850–3900)
MCH: 30.7 pg (ref 27.0–33.0)
MCHC: 33.2 g/dL (ref 32.0–36.0)
MCV: 92.2 fL (ref 80.0–100.0)
MPV: 9.6 fL (ref 7.5–12.5)
Monocytes Relative: 9.1 %
Neutro Abs: 3048 cells/uL (ref 1500–7800)
Neutrophils Relative %: 63.5 %
Platelets: 293 10*3/uL (ref 140–400)
RBC: 4.24 10*6/uL (ref 3.80–5.10)
RDW: 12.9 % (ref 11.0–15.0)
Total Lymphocyte: 26.6 %
WBC: 4.8 10*3/uL (ref 3.8–10.8)

## 2021-01-15 LAB — COMPLETE METABOLIC PANEL WITH GFR
AG Ratio: 1.4 (calc) (ref 1.0–2.5)
ALT: 12 U/L (ref 6–29)
AST: 17 U/L (ref 10–30)
Albumin: 4.6 g/dL (ref 3.6–5.1)
Alkaline phosphatase (APISO): 43 U/L (ref 31–125)
BUN: 11 mg/dL (ref 7–25)
CO2: 28 mmol/L (ref 20–32)
Calcium: 9.8 mg/dL (ref 8.6–10.2)
Chloride: 104 mmol/L (ref 98–110)
Creat: 0.8 mg/dL (ref 0.50–0.96)
Globulin: 3.2 g/dL (calc) (ref 1.9–3.7)
Glucose, Bld: 77 mg/dL (ref 65–99)
Potassium: 4.3 mmol/L (ref 3.5–5.3)
Sodium: 139 mmol/L (ref 135–146)
Total Bilirubin: 0.4 mg/dL (ref 0.2–1.2)
Total Protein: 7.8 g/dL (ref 6.1–8.1)
eGFR: 105 mL/min/{1.73_m2} (ref >=60)

## 2021-01-15 NOTE — Progress Notes (Signed)
CBC and CMP WNL

## 2021-01-22 ENCOUNTER — Telehealth: Payer: Self-pay

## 2021-01-22 NOTE — Telephone Encounter (Signed)
Patient advised she does not need to hold her MTX for the flu vaccine. Patient advised it is recommended for her to get the Covid booster. Patient advised to hold her MTX for 1 week after the Covid booster. Patient expressed understanding.

## 2021-01-22 NOTE — Telephone Encounter (Signed)
Patient called stating she is scheduled to have her flu vaccine this afternoon and is checking if she needs to hold her Methotrexate.    Patient also wants to know if Dr. Corliss Skains recommends her getting the new booster vaccine.

## 2021-01-29 ENCOUNTER — Other Ambulatory Visit: Payer: Self-pay | Admitting: Physician Assistant

## 2021-01-29 DIAGNOSIS — M3219 Other organ or system involvement in systemic lupus erythematosus: Secondary | ICD-10-CM

## 2021-01-29 NOTE — Telephone Encounter (Signed)
Next Visit: 03/11/2021  Last Visit: 10/08/2020  Labs: 01/14/2021 CBC and CMP WNL  Eye exam:  11/17/2019 WNL    Current Dose per office note 10/08/2020: Plaquenil 200 mg 1 tablet by mouth BID M-F.  YK:DXIPJ systemic lupus erythematosus with other organ involvement  Last Fill: 10/08/2020  Patient advised she is due to update her PLQ eye exam. Patient states she has updated her PLQ eye exam and will contact their office to fax results.   Okay to refill Plaquenil?

## 2021-02-15 ENCOUNTER — Telehealth: Payer: Self-pay

## 2021-02-15 NOTE — Telephone Encounter (Signed)
Patient called stating she is planning to get the booster vaccine this afternoon and is not sure if she should get the regular Covid booster or the new Bivalent booster.  Patient requested a return call.

## 2021-02-15 NOTE — Telephone Encounter (Signed)
[employer] of rheumatology recommends new bivalent booster.  She should hold methotrexate for 1 week after the booster.

## 2021-02-15 NOTE — Telephone Encounter (Signed)
I called patient, patient verbalized understanding, per Dr. Corliss Skains, patient will hold today's regular dose to MTX and resume next Friday, 02/22/2021.

## 2021-02-26 NOTE — Progress Notes (Signed)
Office Visit Note  Patient: Brittany Preston             Date of Birth: [birth year]           MRN: [id number]             PCP: Bartholome Bill, MD Referring: Bartholome Bill, MD Visit Date: 03/11/2021 Occupation: @GUAROCC @  Subjective:  Lupus (Doing good)   History of Present Illness: Brittany Preston is a 25 y.o. female with a history of systemic lupus treatment doses.  She states she continues to have Raynaud's phenomenon.  She also gives history of fatigue.  There is no history of oral ulcers, nasal ulcers, malar rash, photosensitivity or lymphadenopathy.  She has been tolerating medications well  Activities of Daily Living:  Patient reports morning stiffness for 0  none .   Patient Denies nocturnal pain.  Difficulty dressing/grooming: Denies Difficulty climbing stairs: Denies Difficulty getting out of chair: Denies Difficulty using hands for taps, buttons, cutlery, and/or writing: Denies  Review of Systems  Constitutional:  Positive for fatigue. Negative for night sweats, weight gain and weight loss.  HENT:  Negative for mouth sores, trouble swallowing, trouble swallowing, mouth dryness and nose dryness.   Eyes:  Negative for pain, redness, visual disturbance and dryness.  Respiratory:  Negative for cough, shortness of breath and difficulty breathing.   Cardiovascular:  Negative for chest pain, palpitations, hypertension, irregular heartbeat and swelling in legs/feet.  Gastrointestinal:  Positive for constipation. Negative for blood in stool and diarrhea.  Endocrine: Positive for excessive thirst. Negative for increased urination.  Genitourinary:  Negative for difficulty urinating and vaginal dryness.  Musculoskeletal:  Negative for joint pain, joint pain, joint swelling, myalgias, muscle weakness, morning stiffness, muscle tenderness and myalgias.  Skin:  Negative for color change, rash, hair loss, skin tightness, ulcers and sensitivity to sunlight.  Allergic/Immunologic:  Negative for susceptible to infections.  Neurological:  Negative for dizziness, numbness, memory loss, night sweats and weakness.  Hematological:  Negative for bruising/bleeding tendency and swollen glands.  Psychiatric/Behavioral:  Negative for depressed mood and sleep disturbance. The patient is nervous/anxious.    PMFS History:  Patient Active Problem List   Diagnosis Date Noted   Systemic lupus erythematosus ([place]) 11/10/2016   Vitamin D deficiency 11/06/2016   Hair loss 10/14/2016   Leukopenia 10/14/2016   High risk medication use 10/14/2016   Moderate persistent asthma without complication 67/03/4579    Past Medical History:  Diagnosis Date   Asthma    Lupus ([place]) 06/2016   Systemic lupus erythematosus ([place])     History reviewed. No pertinent family history. History reviewed. No pertinent surgical history. Social History   Social History Narrative   Not on file   Immunization History  Administered Date(s) Administered   DTaP 03/04/1996, 06/27/1996, 10/07/1996, 07/07/1997, 01/03/2000   HPV Quadrivalent 12/27/2008, 03/09/2009, 06/15/2009   Hepatitis A 06/18/2005, 10/20/2006   Hepatitis A, Ped/Adol-2 Dose 06/18/2005, 10/20/2006   Hepatitis B 01/13/1996, 02/24/1996, 10/07/1996   Hepatitis B, ped/adol 01/13/1996, 02/24/1996, 10/07/1996   HiB (PRP-OMP) 03/04/1996, 06/27/1996, 10/07/1996, 07/07/1997   HiB (PRP-T) 03/04/1996, 06/27/1996, 10/07/1996, 07/07/1997   IPV 03/04/1996, 06/27/1996, 10/07/1996, 01/03/2000   Influenza,inj,Quad PF,6+ Mos 12/10/2015   Influenza-Unspecified 12/10/2015, 01/07/2017   MMR 12/30/1996, 01/03/2000   Meningococcal Conjugate 12/27/2007, 03/29/2013   PFIZER(Purple Top)SARS-COV-2 Vaccination 06/10/2019, 07/01/2019, 01/23/2020   Pneumococcal Conjugate-13 01/03/2000   Pneumococcal Polysaccharide-23 08/04/2016   Tdap 10/20/2006   Varicella 12/30/1996, 06/18/2005     Objective: Vital Signs:  BP 109/71 (BP Location: Left Arm, Patient Position:  Sitting, Cuff Size: Normal)   Pulse 81   Resp 14   Ht $R'5\' 2"'iz$  (1.575 m)   Wt 173 lb (78.5 kg)   BMI 31.64 kg/m    Physical Exam Vitals and nursing note reviewed.  Constitutional:      Appearance: She is well-developed.  HENT:     Head: Normocephalic and atraumatic.  Eyes:     Conjunctiva/sclera: Conjunctivae normal.  Cardiovascular:     Rate and Rhythm: Normal rate and regular rhythm.     Heart sounds: Normal heart sounds.  Pulmonary:     Effort: Pulmonary effort is normal.     Breath sounds: Normal breath sounds.  Abdominal:     General: Bowel sounds are normal.     Palpations: Abdomen is soft.  Musculoskeletal:     Cervical back: Normal range of motion.  Lymphadenopathy:     Cervical: No cervical adenopathy.  Skin:    General: Skin is warm and dry.     Capillary Refill: Capillary refill takes less than 2 seconds.     Comments: Decreased capillary refill was noted.  No telangiectasias or nailbed capillary changes were noted.  Neurological:     Mental Status: She is alert and oriented to person, place, and time.  Psychiatric:        Behavior: Behavior normal.     Musculoskeletal Exam: C-spine, thoracic and lumbar spine were in good range of motion.  Shoulder joints, elbow joints, wrist joints, MCPs PIPs and DIPs with good range of motion with no synovitis.  Hip joints, knee joints, ankles, MTPs and PIPs with good range of motion with no synovitis  CDAI Exam: CDAI Score: -- Patient Global: --; Provider Global: -- Swollen: --; Tender: -- Joint Exam 03/11/2021   No joint exam has been documented for this visit   There is currently no information documented on the homunculus. Go to the Rheumatology activity and complete the homunculus joint exam.  Investigation: No additional findings.  Imaging: No results found.  Recent Labs: Lab Results  Component Value Date   WBC 4.8 01/14/2021   HGB 13.0 01/14/2021   PLT 293 01/14/2021   NA 139 01/14/2021   K 4.3  01/14/2021   CL 104 01/14/2021   CO2 28 01/14/2021   GLUCOSE 77 01/14/2021   BUN 11 01/14/2021   CREATININE 0.80 01/14/2021   BILITOT 0.4 01/14/2021   ALKPHOS 34 11/13/2016   AST 17 01/14/2021   ALT 12 01/14/2021   PROT 7.8 01/14/2021   ALBUMIN 4.3 11/13/2016   CALCIUM 9.8 01/14/2021   GFRAA 121 07/12/2020    Speciality Comments: PLQ Eye Exam: 11/23/2020 WNL @ [facility] follow up in 1 year  Procedures:  No procedures performed Allergies: Patient has no known allergies.   Assessment / Plan:     Visit Diagnoses: Other systemic lupus erythematosus with other organ involvement ([place]) - History of fatigue, weight loss, hair loss, malar rash, Raynauds, skin lupus, neutropenia, elevated sedimentation rate, positive ANA,ds DNA, Sm, Ro, low C3: -She is clinically doing well.  She complains of fatigue.  There is no history of recent hair loss, malar rash, skin rash or lymphadenopathy.  She continues to have Raynauds symptoms.  High risk medication use - Methotrexate 4 tablets by mouth once weekly, folic acid 1 mg daily, and Plaquenil 200 mg 1 tablet by mouth BID M-F. PLQ Eye Exam: 11/23/2020 -labs from January 14, 2021 were reviewed which were  within normal limits.  Autoimmune labs were done and July 2022 which were negative.  We will get labs in 3 months.  Abstinence from alcohol was discussed.  Strict contraception was discussed.  Patient states that she will schedule appointment with her OB/GYN.  She states that she is currently not sexually active.  Information regarding immunization was also placed in the AVS.  Raynaud's syndrome without gangrene-pink core temperature warm and warm clothing was discussed.  Other decreased white blood cell (WBC) count-her white Cell count has been stable now.  Vitamin D deficiency-her last vitamin D level was 62 on Aug 17, 2018.  She has been taking vitamin D on a regular basis.  History of asthma  BMI 31.64-need for regular exercise was discussed.   Instructions regarding diet and exercise were placed in the AVS.  Anxiety-patient states she is been experiencing increased anxiety.  I advised her to discuss this further with her PCP.  She was also advised not to take any medications which can cause QT prolongation with hydroxychloroquine.  Family history of lupus erythematosus  Orders: No orders of the defined types were placed in this encounter.  No orders of the defined types were placed in this encounter.    Follow-Up Instructions: Return in about 3 months (around 06/09/2021) for Systemic lupus.   Bo Merino, MD  Note - This record has been created using Editor, commissioning.  Chart creation errors have been sought, but may not always  have been located. Such creation errors do not reflect on  the standard of medical care.

## 2021-03-11 ENCOUNTER — Other Ambulatory Visit: Payer: Self-pay

## 2021-03-11 ENCOUNTER — Encounter: Payer: Self-pay | Admitting: Rheumatology

## 2021-03-11 ENCOUNTER — Ambulatory Visit: Payer: 59 | Admitting: Rheumatology

## 2021-03-11 VITALS — BP 109/71 | HR 81 | Resp 14 | Ht 62.0 in | Wt 173.0 lb

## 2021-03-11 DIAGNOSIS — Z840 Family history of diseases of the skin and subcutaneous tissue: Secondary | ICD-10-CM

## 2021-03-11 DIAGNOSIS — M25661 Stiffness of right knee, not elsewhere classified: Secondary | ICD-10-CM

## 2021-03-11 DIAGNOSIS — D72818 Other decreased white blood cell count: Secondary | ICD-10-CM

## 2021-03-11 DIAGNOSIS — I7300 Raynaud's syndrome without gangrene: Secondary | ICD-10-CM | POA: Diagnosis not present

## 2021-03-11 DIAGNOSIS — Z8709 Personal history of other diseases of the respiratory system: Secondary | ICD-10-CM

## 2021-03-11 DIAGNOSIS — Z79899 Other long term (current) drug therapy: Secondary | ICD-10-CM

## 2021-03-11 DIAGNOSIS — Z6831 Body mass index (BMI) 31.0-31.9, adult: Secondary | ICD-10-CM

## 2021-03-11 DIAGNOSIS — E559 Vitamin D deficiency, unspecified: Secondary | ICD-10-CM

## 2021-03-11 DIAGNOSIS — F419 Anxiety disorder, unspecified: Secondary | ICD-10-CM

## 2021-03-11 DIAGNOSIS — M3219 Other organ or system involvement in systemic lupus erythematosus: Secondary | ICD-10-CM | POA: Diagnosis not present

## 2021-03-11 NOTE — Patient Instructions (Signed)
Standing Labs We placed an order today for your standing lab work.   Please have your standing labs drawn in January and every 3 months  If possible, please have your labs drawn 2 weeks prior to your appointment so that the provider can discuss your results at your appointment.  Please note that you may see your imaging and lab results in MyChart before we have reviewed them. We may be awaiting multiple results to interpret others before contacting you. Please allow our office up to 72 hours to thoroughly review all of the results before contacting the office for clarification of your results.  We have open lab daily: Monday through Thursday from 1:30-4:30 PM and Friday from 1:30-4:00 PM at the office of Dr. Pollyann Savoy, [facility].   Please be advised, all patients with office appointments requiring lab work will take precedent over walk-in lab work.  If possible, please come for your lab work on Monday and Friday afternoons, as you may experience shorter wait times. The office is located at [address], [place], [place] [address] No appointment is necessary.   Labs are drawn by [facility]. Please bring your co-pay at the time of your lab draw.  You may receive a bill from [facility] for your lab work.  If you wish to have your labs drawn at another location, please call the office 24 hours in advance to send orders.  If you have any questions regarding directions or hours of operation,  please call [phone].   As a reminder, please drink plenty of water prior to coming for your lab work. Thanks!   Vaccines You are taking a medication(s) that can suppress your immune system.  The following immunizations are recommended: Flu annually Covid-19  Td/Tdap (tetanus, diphtheria, pertussis) every 10 years Pneumonia (Prevnar 15 then Pneumovax 23 at least 1 year apart.  Alternatively, can take Prevnar 20 without needing additional dose) Shingrix: 2 doses from 4 weeks  to 6 months apart HPV  Please check with your PCP to make sure you are up to date.   Heart Disease Prevention   Your inflammatory disease increases your risk of heart disease which includes heart attack, stroke, atrial fibrillation (irregular heartbeats), high blood pressure, heart failure and atherosclerosis (plaque in the arteries).  It is important to reduce your risk by:   Keep blood pressure, cholesterol, and blood sugar at healthy levels   Smoking Cessation   Maintain a healthy weight  BMI 20-25   Eat a healthy diet  Plenty of fresh fruit, vegetables, and whole grains  Limit saturated fats, foods high in sodium, and added sugars  DASH and Mediterranean diet   Increase physical activity  Recommend moderate physically activity for 150 minutes per week/ 30 minutes a day for five days a week These can be broken up into three separate ten-minute sessions during the day.   Reduce Stress  Meditation, slow breathing exercises, yoga, coloring books  Dental visits twice a year

## 2021-03-19 ENCOUNTER — Other Ambulatory Visit: Payer: Self-pay | Admitting: Physician Assistant

## 2021-03-19 DIAGNOSIS — M3219 Other organ or system involvement in systemic lupus erythematosus: Secondary | ICD-10-CM

## 2021-04-11 ENCOUNTER — Other Ambulatory Visit: Payer: Self-pay | Admitting: Physician Assistant

## 2021-04-11 NOTE — Telephone Encounter (Signed)
Next Visit: 06/11/2021  Last Visit: 03/11/2021  Last Fill: 01/10/2021  DX: Other systemic lupus erythematosus with other organ involvement  Current Dose per office note 03/11/2021: Methotrexate 4 tablets by mouth once weekly  Labs: 01/14/2021 CBC and CMP WNL  Left message to advise patient she is due to update labs  Okay to refill MTX?

## 2021-04-11 NOTE — Telephone Encounter (Signed)
Left message to advise patient she may come in for labs tomorrow.

## 2021-04-12 ENCOUNTER — Other Ambulatory Visit: Payer: Self-pay

## 2021-04-12 DIAGNOSIS — E559 Vitamin D deficiency, unspecified: Secondary | ICD-10-CM

## 2021-04-12 DIAGNOSIS — Z79899 Other long term (current) drug therapy: Secondary | ICD-10-CM

## 2021-04-12 DIAGNOSIS — M3219 Other organ or system involvement in systemic lupus erythematosus: Secondary | ICD-10-CM

## 2021-04-15 LAB — C3 AND C4
C3 Complement: 150 mg/dL (ref 83–193)
C4 Complement: 31 mg/dL (ref 15–57)

## 2021-04-15 LAB — COMPLETE METABOLIC PANEL WITH GFR
AG Ratio: 1.3 (calc) (ref 1.0–2.5)
ALT: 15 U/L (ref 6–29)
AST: 18 U/L (ref 10–30)
Albumin: 4.8 g/dL (ref 3.6–5.1)
Alkaline phosphatase (APISO): 45 U/L (ref 31–125)
BUN: 12 mg/dL (ref 7–25)
CO2: 26 mmol/L (ref 20–32)
Calcium: 10.3 mg/dL — ABNORMAL HIGH (ref 8.6–10.2)
Chloride: 104 mmol/L (ref 98–110)
Creat: 0.85 mg/dL (ref 0.50–0.96)
Globulin: 3.8 g/dL (calc) — ABNORMAL HIGH (ref 1.9–3.7)
Glucose, Bld: 68 mg/dL (ref 65–99)
Potassium: 4 mmol/L (ref 3.5–5.3)
Sodium: 139 mmol/L (ref 135–146)
Total Bilirubin: 0.5 mg/dL (ref 0.2–1.2)
Total Protein: 8.6 g/dL — ABNORMAL HIGH (ref 6.1–8.1)
eGFR: 97 mL/min/{1.73_m2} (ref >=60)

## 2021-04-15 LAB — CBC WITH DIFFERENTIAL/PLATELET
Absolute Monocytes: 270 cells/uL (ref 200–950)
Basophils Absolute: 11 cells/uL (ref 0–200)
Basophils Relative: 0.3 %
Eosinophils Absolute: 19 cells/uL (ref 15–500)
Eosinophils Relative: 0.5 %
HCT: 42.1 % (ref 35.0–45.0)
Hemoglobin: 14.2 g/dL (ref 11.7–15.5)
Lymphs Abs: 881 cells/uL (ref 850–3900)
MCH: 31.2 pg (ref 27.0–33.0)
MCHC: 33.7 g/dL (ref 32.0–36.0)
MCV: 92.5 fL (ref 80.0–100.0)
MPV: 9.7 fL (ref 7.5–12.5)
Monocytes Relative: 7.3 %
Neutro Abs: 2520 cells/uL (ref 1500–7800)
Neutrophils Relative %: 68.1 %
Platelets: 320 10*3/uL (ref 140–400)
RBC: 4.55 10*6/uL (ref 3.80–5.10)
RDW: 13 % (ref 11.0–15.0)
Total Lymphocyte: 23.8 %
WBC: 3.7 10*3/uL — ABNORMAL LOW (ref 3.8–10.8)

## 2021-04-15 LAB — PROTEIN / CREATININE RATIO, URINE
Creatinine, Urine: 157 mg/dL (ref 20–275)
Protein/Creat Ratio: 76 mg/g creat (ref 24–184)
Protein/Creatinine Ratio: 0.076 mg/mg creat (ref 0.024–0.184)
Total Protein, Urine: 12 mg/dL (ref 5–24)

## 2021-04-15 LAB — VITAMIN D 25 HYDROXY (VIT D DEFICIENCY, FRACTURES): Vit D, 25-Hydroxy: 63 ng/mL (ref 30–100)

## 2021-04-15 LAB — SEDIMENTATION RATE: Sed Rate: 17 mm/h (ref 0–20)

## 2021-04-15 LAB — ANTI-DNA ANTIBODY, DOUBLE-STRANDED: ds DNA Ab: 2 IU/mL

## 2021-04-15 NOTE — Progress Notes (Signed)
dsDNA is negative.  Complements WNL.  ESR WNL.   Vitamin D WNL.   Protein creatinine ratio WNL.  Calcium is elevated-10.3.  Total protein-8.6. Please place future orders for SPEP.  rest of CMP WNL. WBC count is borderline low-3.7.  rest of CBC WNL.

## 2021-04-16 ENCOUNTER — Telehealth: Payer: Self-pay | Admitting: *Deleted

## 2021-04-16 DIAGNOSIS — M3219 Other organ or system involvement in systemic lupus erythematosus: Secondary | ICD-10-CM

## 2021-04-16 NOTE — Telephone Encounter (Signed)
-----  Message from Ofilia Neas, PA-C sent at 04/15/2021  4:33 PM EST ----- dsDNA is negative.  Complements WNL.  ESR WNL.   Vitamin D WNL.   Protein creatinine ratio WNL.  Calcium is elevated-10.3.  Total protein-8.6. Please place future orders for SPEP.  rest of CMP WNL. WBC count is borderline low-3.7.  rest of CBC WNL.

## 2021-04-23 ENCOUNTER — Other Ambulatory Visit: Payer: Self-pay | Admitting: Physician Assistant

## 2021-04-23 DIAGNOSIS — M3219 Other organ or system involvement in systemic lupus erythematosus: Secondary | ICD-10-CM

## 2021-04-24 NOTE — Telephone Encounter (Signed)
Next Visit: 06/11/2021   Last Visit: 03/11/2021   Last Fill: 01/29/2021  DX: Other systemic lupus erythematosus with other organ involvement   Current Dose per office note 03/11/2021: Plaquenil 200 mg 1 tablet by mouth BID M-F  Labs: 04/12/2021 Calcium is elevated-10.3.  Total protein-8.6. Please place future orders for SPEP.  rest of CMP WNL. WBC count is borderline low-3.7.  rest of CBC WNL.   PLQ Eye Exam: 11/23/2020 WNL   Okay to refill PLQ?

## 2021-05-10 ENCOUNTER — Other Ambulatory Visit: Payer: Self-pay | Admitting: Rheumatology

## 2021-05-10 NOTE — Telephone Encounter (Signed)
Next Visit: 06/11/2021   Last Visit: 03/11/2021   Last Fill: 04/11/2021 (30 day supply)  DX: Other systemic lupus erythematosus with other organ involvement   Current Dose per office note 03/11/2021: Methotrexate 4 tablets by mouth once weekly  Labs: 04/12/2021 Calcium is elevated-10.3.  Total protein-8.6. Please place future orders for SPEP.  rest of CMP WNL. WBC count is borderline low-3.7.  rest of CBC WNL.    Okay to refill MTX?

## 2021-05-29 NOTE — Progress Notes (Unsigned)
Office Visit Note  Patient: Brittany Preston             Date of Birth: 1996/01/24           MRN: [id number]             PCP: Bartholome Bill, MD Referring: Bartholome Bill, MD Visit Date: 06/11/2021 Occupation: @GUAROCC @  Subjective:    History of Present Illness: Brittany Preston is a 26 y.o. female with history of systemic lupus erythematosus.  She is taking plaquenil 200 mg 1 tablet by mouth twice daily Monday through Friday and methotrexate 4 tablets by mouth once weekly.   PLQ Eye Exam: 11/23/2020 WNL @ [facility] follow up in 1 year. Lab work from 04/12/21 was reviewed today in the office: dsDNA is negative, complements WNL, protein creatinine ratio WNL, vitamin D was WNL-63, and ESR WNL.   Activities of Daily Living:  Patient reports morning stiffness for *** {minute/hour:19697}.   Patient {ACTIONS;DENIES/REPORTS:21021675::"Denies"} nocturnal pain.  Difficulty dressing/grooming: {ACTIONS;DENIES/REPORTS:21021675::"Denies"} Difficulty climbing stairs: {ACTIONS;DENIES/REPORTS:21021675::"Denies"} Difficulty getting out of chair: {ACTIONS;DENIES/REPORTS:21021675::"Denies"} Difficulty using hands for taps, buttons, cutlery, and/or writing: {ACTIONS;DENIES/REPORTS:21021675::"Denies"}  No Rheumatology ROS completed.   PMFS History:  Patient Active Problem List   Diagnosis Date Noted   Systemic lupus erythematosus ([place]) 11/10/2016   Vitamin D deficiency 11/06/2016   Hair loss 10/14/2016   Leukopenia 10/14/2016   High risk medication use 10/14/2016   Moderate persistent asthma without complication 51/05/5850    Past Medical History:  Diagnosis Date   Asthma    Lupus ([place]) 06/2016   Systemic lupus erythematosus ([place])     No family history on file. No past surgical history on file. Social History   Social History Narrative   Not on file   Immunization History  Administered Date(s) Administered   DTaP 03/04/1996, 06/27/1996, 10/07/1996, 07/07/1997, 01/03/2000    HPV Quadrivalent 12/27/2008, 03/09/2009, 06/15/2009   Hepatitis A 06/18/2005, 10/20/2006   Hepatitis A, Ped/Adol-2 Dose 06/18/2005, 10/20/2006   Hepatitis B 01/13/1996, 02/24/1996, 10/07/1996   Hepatitis B, ped/adol 01/13/1996, 02/24/1996, 10/07/1996   HiB (PRP-OMP) 03/04/1996, 06/27/1996, 10/07/1996, 07/07/1997   HiB (PRP-T) 03/04/1996, 06/27/1996, 10/07/1996, 07/07/1997   IPV 03/04/1996, 06/27/1996, 10/07/1996, 01/03/2000   Influenza,inj,Quad PF,6+ Mos 12/10/2015   Influenza-Unspecified 12/10/2015, 01/07/2017   MMR 12/30/1996, 01/03/2000   Meningococcal Conjugate 12/27/2007, 03/29/2013   PFIZER(Purple Top)SARS-COV-2 Vaccination 06/10/2019, 07/01/2019, 01/23/2020   Pneumococcal Conjugate-13 01/03/2000   Pneumococcal Polysaccharide-23 08/04/2016   Tdap 10/20/2006   Varicella 12/30/1996, 06/18/2005     Objective: Vital Signs: There were no vitals taken for this visit.   Physical Exam Vitals and nursing note reviewed.  Constitutional:      Appearance: She is well-developed.  HENT:     Head: Normocephalic and atraumatic.  Eyes:     Conjunctiva/sclera: Conjunctivae normal.  Cardiovascular:     Rate and Rhythm: Normal rate and regular rhythm.     Heart sounds: Normal heart sounds.  Pulmonary:     Effort: Pulmonary effort is normal.     Breath sounds: Normal breath sounds.  Abdominal:     General: Bowel sounds are normal.     Palpations: Abdomen is soft.  Musculoskeletal:     Cervical back: Normal range of motion.  Lymphadenopathy:     Cervical: No cervical adenopathy.  Skin:    General: Skin is warm and dry.     Capillary Refill: Capillary refill takes less than 2 seconds.  Neurological:     Mental Status: She  is alert and oriented to person, place, and time.  Psychiatric:        Behavior: Behavior normal.     Musculoskeletal Exam: ***  CDAI Exam: CDAI Score: -- Patient Global: --; Provider Global: -- Swollen: --; Tender: -- Joint Exam 06/11/2021   No joint exam  has been documented for this visit   There is currently no information documented on the homunculus. Go to the Rheumatology activity and complete the homunculus joint exam.  Investigation: No additional findings.  Imaging: No results found.  Recent Labs: Lab Results  Component Value Date   WBC 3.7 (L) 04/12/2021   HGB 14.2 04/12/2021   PLT 320 04/12/2021   NA 139 04/12/2021   K 4.0 04/12/2021   CL 104 04/12/2021   CO2 26 04/12/2021   GLUCOSE 68 04/12/2021   BUN 12 04/12/2021   CREATININE 0.85 04/12/2021   BILITOT 0.5 04/12/2021   ALKPHOS 34 11/13/2016   AST 18 04/12/2021   ALT 15 04/12/2021   PROT 8.6 (H) 04/12/2021   ALBUMIN 4.3 11/13/2016   CALCIUM 10.3 (H) 04/12/2021   GFRAA 121 07/12/2020    Speciality Comments: PLQ Eye Exam: 11/23/2020 WNL @ [facility] follow up in 1 year  Procedures:  No procedures performed Allergies: Patient has no known allergies.   Assessment / Plan:     Visit Diagnoses: No diagnosis found.  Orders: No orders of the defined types were placed in this encounter.  No orders of the defined types were placed in this encounter.   Face-to-face time spent with patient was *** minutes. Greater than 50% of time was spent in counseling and coordination of care.  Follow-Up Instructions: No follow-ups on file.   Earnestine Mealing, CMA  Note - This record has been created using Editor, commissioning.  Chart creation errors have been sought, but may not always  have been located. Such creation errors do not reflect on  the standard of medical care.

## 2021-06-11 ENCOUNTER — Other Ambulatory Visit: Payer: Self-pay

## 2021-06-11 ENCOUNTER — Ambulatory Visit: Payer: 59 | Admitting: Physician Assistant

## 2021-06-11 ENCOUNTER — Encounter: Payer: Self-pay | Admitting: Physician Assistant

## 2021-06-11 VITALS — BP 126/81 | HR 85 | Ht 62.0 in | Wt 173.6 lb

## 2021-06-11 DIAGNOSIS — M3219 Other organ or system involvement in systemic lupus erythematosus: Secondary | ICD-10-CM | POA: Diagnosis not present

## 2021-06-11 DIAGNOSIS — D72818 Other decreased white blood cell count: Secondary | ICD-10-CM | POA: Diagnosis not present

## 2021-06-11 DIAGNOSIS — F419 Anxiety disorder, unspecified: Secondary | ICD-10-CM

## 2021-06-11 DIAGNOSIS — E559 Vitamin D deficiency, unspecified: Secondary | ICD-10-CM

## 2021-06-11 DIAGNOSIS — I7300 Raynaud's syndrome without gangrene: Secondary | ICD-10-CM

## 2021-06-11 DIAGNOSIS — Z79899 Other long term (current) drug therapy: Secondary | ICD-10-CM

## 2021-06-11 DIAGNOSIS — Z8709 Personal history of other diseases of the respiratory system: Secondary | ICD-10-CM

## 2021-06-11 DIAGNOSIS — Z840 Family history of diseases of the skin and subcutaneous tissue: Secondary | ICD-10-CM

## 2021-06-11 MED ORDER — HYDROCORTISONE 1 % EX CREA: TOPICAL_CREAM | Freq: Two times a day (BID) | CUTANEOUS | 0 refills | Status: DC | PRN

## 2021-06-11 NOTE — Patient Instructions (Signed)
Standing Labs ?We placed an order today for your standing lab work.  ? ?Please have your standing labs drawn in April and every 3 months  ? ?If possible, please have your labs drawn 2 weeks prior to your appointment so that the provider can discuss your results at your appointment. ? ?Please note that you may see your imaging and lab results in MyChart before we have reviewed them. ?We may be awaiting multiple results to interpret others before contacting you. ?Please allow our office up to 72 hours to thoroughly review all of the results before contacting the office for clarification of your results. ? ?We have open lab daily: ?Monday through Thursday from 1:30-4:30 PM and Friday from 1:30-4:00 PM ?at the office of Dr. Shaili Deveshwar, [facility].   ?Please be advised, all patients with office appointments requiring lab work will take precedent over walk-in lab work.  ?If possible, please come for your lab work on Monday and Friday afternoons, as you may experience shorter wait times. ?The office is located at [address], [address], [place], [place] [address] ?No appointment is necessary.   ?Labs are drawn by [facility]. Please bring your co-pay at the time of your lab draw.  You may receive a bill from [facility] for your lab work. ? ?Please note if you are on Hydroxychloroquine and and an order has been placed for a Hydroxychloroquine level, you will need to have it drawn 4 hours or more after your last dose. ? ?If you wish to have your labs drawn at another location, please call the office 24 hours in advance to send orders. ? ?If you have any questions regarding directions or hours of operation,  ?please call [phone].   ?As a reminder, please drink plenty of water prior to coming for your lab work. Thanks! ? ?

## 2021-06-15 ENCOUNTER — Other Ambulatory Visit: Payer: Self-pay | Admitting: Physician Assistant

## 2021-06-15 DIAGNOSIS — M3219 Other organ or system involvement in systemic lupus erythematosus: Secondary | ICD-10-CM

## 2021-06-17 ENCOUNTER — Encounter: Payer: Self-pay | Admitting: Rheumatology

## 2021-06-17 NOTE — Telephone Encounter (Signed)
Next Visit: 09/17/2021 ? ?Last Visit: 06/11/2021 ? ?Labs: 04/12/2021 Calcium is elevated-10.3.  Total protein-8.6. rest of CMP WNL. ?WBC count is borderline low-3.7.  rest of CBC WNL.  ? ?Eye exam: 11/23/2020 WNL   ? ?Current Dose per office note 06/11/2021: Plaquenil 200 mg 1 tablet by mouth BID M-F ? ?QM:VHQIO systemic lupus erythematosus with other organ involvement  ? ?Last Fill: 04/24/2021 ? ?Okay to refill Plaquenil?  ?

## 2021-07-16 ENCOUNTER — Other Ambulatory Visit: Payer: Self-pay | Admitting: *Deleted

## 2021-07-16 DIAGNOSIS — Z79899 Other long term (current) drug therapy: Secondary | ICD-10-CM

## 2021-07-16 DIAGNOSIS — M3219 Other organ or system involvement in systemic lupus erythematosus: Secondary | ICD-10-CM

## 2021-07-17 NOTE — Progress Notes (Signed)
Total protein is borderline elevated but has trended down-8.2.  rest of CMP WNL. CBC WNL.  ESR and complements WNL.  Protein creatinine ratio WNL.

## 2021-07-18 NOTE — Progress Notes (Signed)
dsDNA is negative.  Labs are not consistent with a flare.

## 2021-07-22 LAB — CBC WITH DIFFERENTIAL/PLATELET
Absolute Monocytes: 373 cells/uL (ref 200–950)
Basophils Absolute: 9 cells/uL (ref 0–200)
Basophils Relative: 0.2 %
Eosinophils Absolute: 32 cells/uL (ref 15–500)
Eosinophils Relative: 0.7 %
HCT: 42.4 % (ref 35.0–45.0)
Hemoglobin: 14.1 g/dL (ref 11.7–15.5)
Lymphs Abs: 1325 cells/uL (ref 850–3900)
MCH: 30.9 pg (ref 27.0–33.0)
MCHC: 33.3 g/dL (ref 32.0–36.0)
MCV: 93 fL (ref 80.0–100.0)
MPV: 10.3 fL (ref 7.5–12.5)
Monocytes Relative: 8.1 %
Neutro Abs: 2861 cells/uL (ref 1500–7800)
Neutrophils Relative %: 62.2 %
Platelets: 279 10*3/uL (ref 140–400)
RBC: 4.56 10*6/uL (ref 3.80–5.10)
RDW: 12.8 % (ref 11.0–15.0)
Total Lymphocyte: 28.8 %
WBC: 4.6 10*3/uL (ref 3.8–10.8)

## 2021-07-22 LAB — COMPLETE METABOLIC PANEL WITH GFR
AG Ratio: 1.3 (calc) (ref 1.0–2.5)
ALT: 16 U/L (ref 6–29)
AST: 19 U/L (ref 10–30)
Albumin: 4.7 g/dL (ref 3.6–5.1)
Alkaline phosphatase (APISO): 43 U/L (ref 31–125)
BUN: 9 mg/dL (ref 7–25)
CO2: 25 mmol/L (ref 20–32)
Calcium: 9.8 mg/dL (ref 8.6–10.2)
Chloride: 105 mmol/L (ref 98–110)
Creat: 0.8 mg/dL (ref 0.50–0.96)
Globulin: 3.5 g/dL (calc) (ref 1.9–3.7)
Glucose, Bld: 69 mg/dL (ref 65–99)
Potassium: 4.2 mmol/L (ref 3.5–5.3)
Sodium: 140 mmol/L (ref 135–146)
Total Bilirubin: 0.4 mg/dL (ref 0.2–1.2)
Total Protein: 8.2 g/dL — ABNORMAL HIGH (ref 6.1–8.1)
eGFR: 105 mL/min/{1.73_m2} (ref >=60)

## 2021-07-22 LAB — PROTEIN ELECTROPHORESIS, SERUM, WITH REFLEX
Albumin ELP: 4.6 g/dL (ref 3.8–4.8)
Alpha 1: 0.3 g/dL (ref 0.2–0.3)
Alpha 2: 0.8 g/dL (ref 0.5–0.9)
Beta 2: 0.5 g/dL (ref 0.2–0.5)
Beta Globulin: 0.4 g/dL (ref 0.4–0.6)
Gamma Globulin: 1.7 g/dL (ref 0.8–1.7)
Total Protein: 8.3 g/dL — ABNORMAL HIGH (ref 6.1–8.1)

## 2021-07-22 LAB — PROTEIN / CREATININE RATIO, URINE
Creatinine, Urine: 197 mg/dL (ref 20–275)
Protein/Creat Ratio: 61 mg/g creat (ref 24–184)
Protein/Creatinine Ratio: 0.061 mg/mg creat (ref 0.024–0.184)
Total Protein, Urine: 12 mg/dL (ref 5–24)

## 2021-07-22 LAB — C3 AND C4
C3 Complement: 147 mg/dL (ref 83–193)
C4 Complement: 31 mg/dL (ref 15–57)

## 2021-07-22 LAB — ANTI-DNA ANTIBODY, DOUBLE-STRANDED: ds DNA Ab: 2 IU/mL

## 2021-07-22 LAB — SEDIMENTATION RATE: Sed Rate: 14 mm/h (ref 0–20)

## 2021-07-22 NOTE — Progress Notes (Signed)
SPEP did not reveal any abnormal protein bands.

## 2021-07-25 ENCOUNTER — Other Ambulatory Visit: Payer: Self-pay | Admitting: Rheumatology

## 2021-07-25 NOTE — Telephone Encounter (Signed)
Next Visit: 09/17/2021 ?  ?Last Visit: 06/11/2021 ?  ?Labs: 07/16/2021 Total protein is borderline elevated but has trended down-8.2.  rest of CMP WNL. CBC WNL. ? ?Current Dose per office note 06/11/2021: Methotrexate 4 tablets by mouth once weekly ?  ?EN:IDPOE systemic lupus erythematosus with other organ involvement  ?  ?Last Fill: 05/10/2021 ? ?Okay to refill MTX?  ?

## 2021-08-17 ENCOUNTER — Other Ambulatory Visit: Payer: Self-pay | Admitting: Physician Assistant

## 2021-08-17 DIAGNOSIS — M3219 Other organ or system involvement in systemic lupus erythematosus: Secondary | ICD-10-CM

## 2021-08-19 NOTE — Telephone Encounter (Signed)
Next Visit: 09/17/2021   Last Visit: 06/11/2021   Labs: 07/16/2021 Total protein is borderline elevated but has trended down-8.2.  rest of CMP WNL. CBC WNL.   Eye exam: 11/23/2020 WNL     Current Dose per office note 06/11/2021: Plaquenil 200 mg 1 tablet by mouth BID M-F   SL:HTDSK systemic lupus erythematosus with other organ involvement    Last Fill: 06/17/2021   Okay to refill Plaquenil?

## 2021-09-04 NOTE — Progress Notes (Unsigned)
Office Visit Note  Patient: Brittany Preston             Date of Birth: 13-Oct-1995           MRN: [id number]             PCP: Bartholome Bill, MD Referring: Bartholome Bill, MD Visit Date: 09/17/2021 Occupation: @GUAROCC @  Subjective:  Medication monitoring   History of Present Illness: Brittany Preston is a 26 y.o. female with history of systemic lupus erythematosus.  She is taking Methotrexate 4 tablets by mouth once weekly, folic acid 1 mg daily, and Plaquenil 200 mg 1 tablet by mouth BID M-F.  She is tolerating both medications without any side effects and has not missed any doses recently.  She denies any signs or symptoms of a systemic lupus flare.  She denies any joint pain or joint swelling at this time.  She states her energy level has been stable and she has been sleeping well at night overall.  She denies any cervical lymphadenopathy or recent fevers.  She has not had any shortness of breath, pleuritic chest pain, or palpitations.  She denies any oral or nasal ulcerations.  She has not had any sicca symptoms recently.  She has occasional symptoms of Raynaud's but denies any digital ulcerations.  She has been experiencing some intermittent dryness around her nose especially with sun exposure.  She states she has been using Aquaphor topically which improves her symptoms.  She does not have any rashes currently.  She has been trying to avoid direct sun exposure by wearing sunscreen on a daily basis and a hat if she plans on being outdoors.  She continues to take a vitamin D supplement 2000 units daily.     Activities of Daily Living:  Patient reports morning stiffness for 20 minutes.   Patient Reports nocturnal pain.  Difficulty dressing/grooming: Denies Difficulty climbing stairs: Denies Difficulty getting out of chair: Denies Difficulty using hands for taps, buttons, cutlery, and/or writing: Denies  Review of Systems  Constitutional:  Negative for fatigue.  HENT:  Negative  for mouth sores, mouth dryness and nose dryness.   Eyes:  Negative for pain, visual disturbance and dryness.  Respiratory:  Negative for cough, hemoptysis, shortness of breath and difficulty breathing.   Cardiovascular:  Negative for chest pain, palpitations, hypertension and swelling in legs/feet.  Gastrointestinal:  Negative for blood in stool, constipation and diarrhea.  Endocrine: Negative for excessive thirst and increased urination.  Genitourinary:  Negative for difficulty urinating and painful urination.  Musculoskeletal:  Positive for joint pain, joint pain and morning stiffness. Negative for joint swelling, myalgias, muscle weakness, muscle tenderness and myalgias.  Skin:  Negative for color change, pallor, rash, hair loss, nodules/bumps, skin tightness, ulcers and sensitivity to sunlight.  Allergic/Immunologic: Negative for susceptible to infections.  Neurological:  Negative for dizziness, numbness, headaches and weakness.  Hematological:  Negative for bruising/bleeding tendency and swollen glands.  Psychiatric/Behavioral:  Negative for depressed mood and sleep disturbance. The patient is not nervous/anxious.     PMFS History:  Patient Active Problem List   Diagnosis Date Noted   Systemic lupus erythematosus ([place]) 11/10/2016   Vitamin D deficiency 11/06/2016   Hair loss 10/14/2016   Leukopenia 10/14/2016   High risk medication use 10/14/2016   Moderate persistent asthma without complication 42/39/5320    Past Medical History:  Diagnosis Date   Asthma    Lupus ([place]) 06/2016   Systemic lupus erythematosus ([place])  Family History  Problem Relation Age of Onset   Lupus Sister    Kidney disease Sister    Hypertension Sister    History reviewed. No pertinent surgical history. Social History   Social History Narrative   Not on file   Immunization History  Administered Date(s) Administered   DTaP 03/04/1996, 06/27/1996, 10/07/1996, 07/07/1997, 01/03/2000   HPV  Quadrivalent 12/27/2008, 03/09/2009, 06/15/2009   Hepatitis A 06/18/2005, 10/20/2006   Hepatitis A, Ped/Adol-2 Dose 06/18/2005, 10/20/2006   Hepatitis B 01/13/1996, 02/24/1996, 10/07/1996   Hepatitis B, ped/adol 01/13/1996, 02/24/1996, 10/07/1996   HiB (PRP-OMP) 03/04/1996, 06/27/1996, 10/07/1996, 07/07/1997   HiB (PRP-T) 03/04/1996, 06/27/1996, 10/07/1996, 07/07/1997   IPV 03/04/1996, 06/27/1996, 10/07/1996, 01/03/2000   Influenza,inj,Quad PF,6+ Mos 12/10/2015   Influenza-Unspecified 12/10/2015, 01/07/2017   MMR 12/30/1996, 01/03/2000   Meningococcal Conjugate 12/27/2007, 03/29/2013   PFIZER(Purple Top)SARS-COV-2 Vaccination 06/10/2019, 07/01/2019, 01/23/2020   Pneumococcal Conjugate-13 01/03/2000   Pneumococcal Polysaccharide-23 08/04/2016   Tdap 10/20/2006   Varicella 12/30/1996, 06/18/2005     Objective: Vital Signs: BP 111/74 (BP Location: Left Arm, Patient Position: Sitting, Cuff Size: Normal)   Pulse 80   Resp 14   Ht $R'5\' 2"'AU$  (1.575 m)   Wt 162 lb 3.2 oz (73.6 kg)   LMP 09/17/2021   BMI 29.67 kg/m    Physical Exam Vitals and nursing note reviewed.  Constitutional:      Appearance: She is well-developed.  HENT:     Head: Normocephalic and atraumatic.  Eyes:     Conjunctiva/sclera: Conjunctivae normal.  Cardiovascular:     Rate and Rhythm: Normal rate and regular rhythm.     Heart sounds: Normal heart sounds.  Pulmonary:     Effort: Pulmonary effort is normal.     Breath sounds: Normal breath sounds.  Abdominal:     General: Bowel sounds are normal.     Palpations: Abdomen is soft.  Musculoskeletal:     Cervical back: Normal range of motion.  Skin:    General: Skin is warm and dry.     Capillary Refill: Capillary refill takes less than 2 seconds.     Comments: No malar rash No digital ulcerations or signs of gangrene   Neurological:     Mental Status: She is alert and oriented to person, place, and time.  Psychiatric:        Behavior: Behavior normal.       Musculoskeletal Exam: C-spine, thoracic spine, lumbar spine and good range of motion.  No midline spinal tenderness.  Shoulder joints, elbow joints, wrist joints, MCPs, PIPs, DIPs have good range of motion with no synovitis.  Complete fist formation bilaterally.  Hip joints have good range of motion with no groin pain.  Knee joints have good range of motion with no warmth or effusion.  Ankle joints have good range of motion with no tenderness or joint swelling.  CDAI Exam: CDAI Score: -- Patient Global: --; Provider Global: -- Swollen: --; Tender: -- Joint Exam 09/17/2021   No joint exam has been documented for this visit   There is currently no information documented on the homunculus. Go to the Rheumatology activity and complete the homunculus joint exam.  Investigation: No additional findings.  Imaging: No results found.  Recent Labs: Lab Results  Component Value Date   WBC 4.6 07/16/2021   HGB 14.1 07/16/2021   PLT 279 07/16/2021   NA 140 07/16/2021   K 4.2 07/16/2021   CL 105 07/16/2021   CO2 25 07/16/2021   GLUCOSE 69  07/16/2021   BUN 9 07/16/2021   CREATININE 0.80 07/16/2021   BILITOT 0.4 07/16/2021   ALKPHOS 34 11/13/2016   AST 19 07/16/2021   ALT 16 07/16/2021   PROT 8.2 (H) 07/16/2021   PROT 8.3 (H) 07/16/2021   ALBUMIN 4.3 11/13/2016   CALCIUM 9.8 07/16/2021   GFRAA 121 07/12/2020    Speciality Comments: PLQ Eye Exam: 11/23/2020 WNL @ [facility] follow up in 1 year  Procedures:  No procedures performed Allergies: Patient has no known allergies.      Assessment / Plan:     Visit Diagnoses: Other systemic lupus erythematosus with other organ involvement ([place]) - History of fatigue, weight loss, hair loss, malar rash, Raynauds, skin lupus, neutropenia, elevated sedimentation rate, positive ANA,ds DNA, Sm, Ro, low C3: She is not experiencing any signs or symptoms of systemic lupus flare.  She has clinically been doing well taking methotrexate 4 tablets  by mouth once weekly folic acid 1 mg daily, Plaquenil 200 mg 1 tablet by mouth twice daily Monday through Friday.  She is tolerating combination therapy without any side effects and has not missed any doses recently.  She has no increased joint pain or synovitis on examination today.  No cervical lymphadenopathy or recent fevers.  She has occasional symptoms of Raynaud's but has no digital ulcerations or signs of gangrene on examination today.  She has not had any oral or nasal ulcerations.  No sicca symptoms.  She experiences intermittent photosensitivity but was encouraged to wear sunscreen SPF 50 on a daily basis and reapply if she plans on being outdoors.  She should also wear some protective clothing and a hat while outdoors.  No malar rash was noted on examination today.  She has not had any shortness of breath, pleuritic chest pain, or palpitations.  Her lungs were clear to auscultation on examination today. Lab work on 07/16/21 was reviewed today in the office: complements WNL, ESR WNL, and dsDNA negative.  The following future orders were placed today to be drawn next month.  She will remain on the current treatment regimen.  She was advised to notify us if she develops signs or symptoms of a flare.  She will follow-up in the office in 3 months or sooner if needed. - Plan: CBC with Differential/Platelet, COMPLETE METABOLIC PANEL WITH GFR, Protein / creatinine ratio, urine, Anti-DNA antibody, double-stranded, C3 and C4, Sedimentation rate, VITAMIN D 25 Hydroxy (Vit-D Deficiency, Fractures), ANA  High risk medication use - Methotrexate 4 tablets by mouth once weekly, folic acid 1 mg daily, and Plaquenil 200 mg 1 tablet by mouth BID M-F.  CBC and CMP updated on 07/16/21.  Future orders for CBC and CMP were placed today to be drawn in July.  She will continue to require updated lab work every 3 months to monitor for drug toxicity. Discussed the importance of holding methotrexate if she develops signs or  symptoms of an infection and to resume once the infection has completely cleared.  - Plan: CBC with Differential/Platelet, COMPLETE METABOLIC PANEL WITH GFR PLQ Eye Exam: 11/23/2020 WNL @ [facility] follow up in 1 year.  Patient was advised to schedule a Plaquenil eye examination.  Raynaud's syndrome without gangrene:  Not currently active. She has intermittent symptoms of raynaud's especially with exposure to cooler weather temperatures.  No digital ulcerations or signs of gangrene.   Other decreased white blood cell (WBC) count -White blood cell count was 3.7 on 04/12/2021.  Her white blood cell  count has returned to within normal limits: 4.6 on 07/16/2021.  Future order for CBC with differential was placed today to be drawn with lab work next month.  Plan: CBC with Differential/Platelet  Vitamin D deficiency -She is taking vitamin D 2000 units daily.  Future order for vitamin D was placed today to be checked with her next lab work.  Plan: VITAMIN D 25 Hydroxy (Vit-D Deficiency, Fractures)  Other medical conditions are listed as follows:   History of asthma  Anxiety  Family history of lupus erythematosus  Other insomnia  Orders: Orders Placed This Encounter  Procedures   CBC with Differential/Platelet   COMPLETE METABOLIC PANEL WITH GFR   Protein / creatinine ratio, urine   Anti-DNA antibody, double-stranded   C3 and C4   Sedimentation rate   VITAMIN D 25 Hydroxy (Vit-D Deficiency, Fractures)   ANA   No orders of the defined types were placed in this encounter.    Follow-Up Instructions: Return in about 3 months (around 12/18/2021) for Systemic lupus erythematosus.   Ofilia Neas, PA-C  Note - This record has been created using Dragon software.  Chart creation errors have been sought, but may not always  have been located. Such creation errors do not reflect on  the standard of medical care.

## 2021-09-15 ENCOUNTER — Other Ambulatory Visit: Payer: Self-pay | Admitting: Rheumatology

## 2021-09-16 NOTE — Telephone Encounter (Signed)
Next Visit: 09/17/2021  Last Visit: 06/11/2021  Last Fill: 10/12/2021  Dx: Other systemic lupus erythematosus with other organ involvement   Current Dose per office note on 06/11/2021: folic acid 1 mg daily  Okay to refill folic acid?

## 2021-09-17 ENCOUNTER — Ambulatory Visit: Payer: 59 | Admitting: Physician Assistant

## 2021-09-17 ENCOUNTER — Encounter: Payer: Self-pay | Admitting: Physician Assistant

## 2021-09-17 VITALS — BP 111/74 | HR 80 | Resp 14 | Ht 62.0 in | Wt 162.2 lb

## 2021-09-17 DIAGNOSIS — Z840 Family history of diseases of the skin and subcutaneous tissue: Secondary | ICD-10-CM

## 2021-09-17 DIAGNOSIS — D72818 Other decreased white blood cell count: Secondary | ICD-10-CM

## 2021-09-17 DIAGNOSIS — M3219 Other organ or system involvement in systemic lupus erythematosus: Secondary | ICD-10-CM

## 2021-09-17 DIAGNOSIS — Z79899 Other long term (current) drug therapy: Secondary | ICD-10-CM

## 2021-09-17 DIAGNOSIS — E559 Vitamin D deficiency, unspecified: Secondary | ICD-10-CM

## 2021-09-17 DIAGNOSIS — F419 Anxiety disorder, unspecified: Secondary | ICD-10-CM

## 2021-09-17 DIAGNOSIS — I7300 Raynaud's syndrome without gangrene: Secondary | ICD-10-CM

## 2021-09-17 DIAGNOSIS — Z8709 Personal history of other diseases of the respiratory system: Secondary | ICD-10-CM

## 2021-09-17 DIAGNOSIS — G4709 Other insomnia: Secondary | ICD-10-CM

## 2021-10-19 ENCOUNTER — Other Ambulatory Visit: Payer: Self-pay | Admitting: Physician Assistant

## 2021-10-19 DIAGNOSIS — M3219 Other organ or system involvement in systemic lupus erythematosus: Secondary | ICD-10-CM

## 2021-10-21 NOTE — Telephone Encounter (Signed)
Next Visit: 12/20/2021  Last Visit: 09/17/2021  Labs: 07/16/2021 Total protein is borderline elevated but has trended down-8.2.  rest of CMP WNL. CBC WNL.    Eye exam: 11/23/2020 WNL   Current Dose per office note 09/17/2021: Plaquenil 200 mg 1 tablet by mouth BID M-F  TD:VVOHY systemic lupus erythematosus with other organ involvement   Last Fill: 08/19/2021  Okay to refill Plaquenil?

## 2021-10-23 ENCOUNTER — Other Ambulatory Visit: Payer: Self-pay | Admitting: Physician Assistant

## 2021-10-23 NOTE — Telephone Encounter (Signed)
Next Visit: 12/20/2021  Last Visit: 09/17/2021  Last Fill: 07/25/2021  DX: Other systemic lupus erythematosus with other organ involvement   Current Dose per office note 09/17/2021: Methotrexate 4 tablets by mouth once weekly  Labs: 07/16/2021, Total protein is borderline elevated but has trended down-8.2.  rest of CMP WNL. CBC WNL.  ESR and complements WNL.  Protein creatinine ratio WNL.  dsDNA is negative.  Labs are not consistent with a flare. SPEP did not reveal any abnormal protein bands.  Okay to refill MTX?

## 2021-10-23 NOTE — Telephone Encounter (Signed)
Left message to advise patient she is due to update labs..  

## 2021-10-23 NOTE — Telephone Encounter (Signed)
Patient is due to update lab work.

## 2021-10-24 ENCOUNTER — Other Ambulatory Visit: Payer: Self-pay | Admitting: *Deleted

## 2021-10-24 DIAGNOSIS — E559 Vitamin D deficiency, unspecified: Secondary | ICD-10-CM

## 2021-10-24 DIAGNOSIS — D72818 Other decreased white blood cell count: Secondary | ICD-10-CM

## 2021-10-24 DIAGNOSIS — Z79899 Other long term (current) drug therapy: Secondary | ICD-10-CM

## 2021-10-24 DIAGNOSIS — M3219 Other organ or system involvement in systemic lupus erythematosus: Secondary | ICD-10-CM

## 2021-10-25 NOTE — Progress Notes (Signed)
Absolute eosinophils are slightly low. Rest of CBC WNL.  CMP WNL.  Protein creatinine ratio WNL.   Vitamin D WNL.   ESR WNL. Complements WNL.

## 2021-10-26 LAB — CBC WITH DIFFERENTIAL/PLATELET
Absolute Monocytes: 365 cells/uL (ref 200–950)
Basophils Absolute: 8 cells/uL (ref 0–200)
Basophils Relative: 0.2 %
Eosinophils Absolute: 8 cells/uL — ABNORMAL LOW (ref 15–500)
Eosinophils Relative: 0.2 %
HCT: 37.1 % (ref 35.0–45.0)
Hemoglobin: 12.7 g/dL (ref 11.7–15.5)
Lymphs Abs: 1264 cells/uL (ref 850–3900)
MCH: 31.5 pg (ref 27.0–33.0)
MCHC: 34.2 g/dL (ref 32.0–36.0)
MCV: 92.1 fL (ref 80.0–100.0)
MPV: 9.8 fL (ref 7.5–12.5)
Monocytes Relative: 8.7 %
Neutro Abs: 2554 cells/uL (ref 1500–7800)
Neutrophils Relative %: 60.8 %
Platelets: 270 10*3/uL (ref 140–400)
RBC: 4.03 10*6/uL (ref 3.80–5.10)
RDW: 13.2 % (ref 11.0–15.0)
Total Lymphocyte: 30.1 %
WBC: 4.2 10*3/uL (ref 3.8–10.8)

## 2021-10-26 LAB — ANTI-DNA ANTIBODY, DOUBLE-STRANDED: ds DNA Ab: 3 IU/mL

## 2021-10-26 LAB — PROTEIN / CREATININE RATIO, URINE
Creatinine, Urine: 166 mg/dL (ref 20–275)
Protein/Creat Ratio: 90 mg/g creat (ref 24–184)
Protein/Creatinine Ratio: 0.09 mg/mg creat (ref 0.024–0.184)
Total Protein, Urine: 15 mg/dL (ref 5–24)

## 2021-10-26 LAB — ANA: Anti Nuclear Antibody (ANA): POSITIVE — AB

## 2021-10-26 LAB — ANTI-NUCLEAR AB-TITER (ANA TITER): ANA Titer 1: 1:320 {titer} — ABNORMAL HIGH

## 2021-10-26 LAB — COMPLETE METABOLIC PANEL WITH GFR
AG Ratio: 1.4 (calc) (ref 1.0–2.5)
ALT: 11 U/L (ref 6–29)
AST: 14 U/L (ref 10–30)
Albumin: 4.5 g/dL (ref 3.6–5.1)
Alkaline phosphatase (APISO): 44 U/L (ref 31–125)
BUN: 12 mg/dL (ref 7–25)
CO2: 26 mmol/L (ref 20–32)
Calcium: 9.7 mg/dL (ref 8.6–10.2)
Chloride: 106 mmol/L (ref 98–110)
Creat: 0.73 mg/dL (ref 0.50–0.96)
Globulin: 3.2 g/dL (calc) (ref 1.9–3.7)
Glucose, Bld: 76 mg/dL (ref 65–99)
Potassium: 4 mmol/L (ref 3.5–5.3)
Sodium: 139 mmol/L (ref 135–146)
Total Bilirubin: 0.3 mg/dL (ref 0.2–1.2)
Total Protein: 7.7 g/dL (ref 6.1–8.1)
eGFR: 117 mL/min/{1.73_m2} (ref >=60)

## 2021-10-26 LAB — SEDIMENTATION RATE: Sed Rate: 9 mm/h (ref 0–20)

## 2021-10-26 LAB — C3 AND C4
C3 Complement: 126 mg/dL (ref 83–193)
C4 Complement: 27 mg/dL (ref 15–57)

## 2021-10-26 LAB — VITAMIN D 25 HYDROXY (VIT D DEFICIENCY, FRACTURES): Vit D, 25-Hydroxy: 72 ng/mL (ref 30–100)

## 2021-10-28 NOTE — Progress Notes (Signed)
ANA remains positive-titer unchanged. dsDNA is negative.

## 2021-11-22 ENCOUNTER — Telehealth: Payer: Self-pay | Admitting: Rheumatology

## 2021-11-22 NOTE — Telephone Encounter (Signed)
Patient called the office requesting a call back. Patient states she would like to know if she misses a dose of MTX if she should take it the next day or wait and take it the next time she is due the week after. Please advise.

## 2021-11-22 NOTE — Telephone Encounter (Signed)
Patient states her pharmacy's power is out so she is unable to pick up methotrexate today. I advised patient if she takes methotrexate tomorrow, she will need to adjust her weekly schedule accordingly. Advised patient that it is okay to delay a day but she cannot take it earlier. Patient verbalized understanding and is going to try get the prescription transferred to another [facility].

## 2021-12-03 NOTE — Progress Notes (Deleted)
Office Visit Note  Patient: Brittany Preston             Date of Birth: 07-27-1995           MRN: [id number]             PCP: Bartholome Bill, MD Referring: Bartholome Bill, MD Visit Date: 12/17/2021 Occupation: _0 @  Subjective:    History of Present Illness: Brittany Preston is a 26 y.o. female with history of systemic lupus erythematosus.  She remains on Methotrexate 4 tablets by mouth once weekly, folic acid 1 mg daily, and Plaquenil 200 mg 1 tablet by mouth BID M-F.  PLQ Eye Exam: 11/23/2020 WNL @ [facility] follow up in 1 year.  CBC and CMP updated on 10/24/2021: Absolute eosinophils borderline low, rest of CBC within normal limits, CMP within normal limits.  Lab work from 10/24/2021 was reviewed today in the office: ANA 1: 320 NS, double-stranded DNA negative, complements within normal limits, ESR within normal limits, MND within normal limits, CMP within normal limits, absolute eosinophils borderline low, rest of CBC within normal limits.  Activities of Daily Living:  Patient reports morning stiffness for *** {minute/hour:19697}.   Patient {ACTIONS;DENIES/REPORTS:21021675::"Denies"} nocturnal pain.  Difficulty dressing/grooming: {ACTIONS;DENIES/REPORTS:21021675::"Denies"} Difficulty climbing stairs: {ACTIONS;DENIES/REPORTS:21021675::"Denies"} Difficulty getting out of chair: {ACTIONS;DENIES/REPORTS:21021675::"Denies"} Difficulty using hands for taps, buttons, cutlery, and/or writing: {ACTIONS;DENIES/REPORTS:21021675::"Denies"}  No Rheumatology ROS completed.   PMFS History:  Patient Active Problem List   Diagnosis Date Noted   Systemic lupus erythematosus ([place]) 11/10/2016   Vitamin D deficiency 11/06/2016   Hair loss 10/14/2016   Leukopenia 10/14/2016   High risk medication use 10/14/2016   Moderate persistent asthma without complication 16/55/3748    Past Medical History:  Diagnosis Date   Asthma    Lupus ([place]) 06/2016   Systemic lupus erythematosus  ([place])     Family History  Problem Relation Age of Onset   Lupus Sister    Kidney disease Sister    Hypertension Sister    No past surgical history on file. Social History   Social History Narrative   Not on file   Immunization History  Administered Date(s) Administered   DTaP 03/04/1996, 06/27/1996, 10/07/1996, 07/07/1997, 01/03/2000   HPV Quadrivalent 12/27/2008, 03/09/2009, 06/15/2009   Hepatitis A 06/18/2005, 10/20/2006   Hepatitis A, Ped/Adol-2 Dose 06/18/2005, 10/20/2006   Hepatitis B 01/13/1996, 02/24/1996, 10/07/1996   Hepatitis B, ped/adol 01/13/1996, 02/24/1996, 10/07/1996   HiB (PRP-OMP) 03/04/1996, 06/27/1996, 10/07/1996, 07/07/1997   HiB (PRP-T) 03/04/1996, 06/27/1996, 10/07/1996, 07/07/1997   IPV 03/04/1996, 06/27/1996, 10/07/1996, 01/03/2000   Influenza,inj,Quad PF,6+ Mos 12/10/2015   Influenza-Unspecified 12/10/2015, 01/07/2017   MMR 12/30/1996, 01/03/2000   Meningococcal Conjugate 12/27/2007, 03/29/2013   PFIZER(Purple Top)SARS-COV-2 Vaccination 06/10/2019, 07/01/2019, 01/23/2020   Pneumococcal Conjugate-13 01/03/2000   Pneumococcal Polysaccharide-23 08/04/2016   Tdap 10/20/2006   Varicella 12/30/1996, 06/18/2005     Objective: Vital Signs: There were no vitals taken for this visit.   Physical Exam Vitals and nursing note reviewed.  Constitutional:      Appearance: She is well-developed.  HENT:     Head: Normocephalic and atraumatic.  Eyes:     Conjunctiva/sclera: Conjunctivae normal.  Cardiovascular:     Rate and Rhythm: Normal rate and regular rhythm.     Heart sounds: Normal heart sounds.  Pulmonary:     Effort: Pulmonary effort is normal.     Breath sounds: Normal breath sounds.  Abdominal:     General: Bowel sounds are normal.  Palpations: Abdomen is soft.  Musculoskeletal:     Cervical back: Normal range of motion.  Skin:    General: Skin is warm and dry.     Capillary Refill: Capillary refill takes less than 2 seconds.   Neurological:     Mental Status: She is alert and oriented to person, place, and time.  Psychiatric:        Behavior: Behavior normal.      Musculoskeletal Exam: ***  CDAI Exam: CDAI Score: -- Patient Global: --; Provider Global: -- Swollen: --; Tender: -- Joint Exam 12/17/2021   No joint exam has been documented for this visit   There is currently no information documented on the homunculus. Go to the Rheumatology activity and complete the homunculus joint exam.  Investigation: No additional findings.  Imaging: No results found.  Recent Labs: Lab Results  Component Value Date   WBC 4.2 10/24/2021   HGB 12.7 10/24/2021   PLT 270 10/24/2021   NA 139 10/24/2021   K 4.0 10/24/2021   CL 106 10/24/2021   CO2 26 10/24/2021   GLUCOSE 76 10/24/2021   BUN 12 10/24/2021   CREATININE 0.73 10/24/2021   BILITOT 0.3 10/24/2021   ALKPHOS 34 11/13/2016   AST 14 10/24/2021   ALT 11 10/24/2021   PROT 7.7 10/24/2021   ALBUMIN 4.3 11/13/2016   CALCIUM 9.7 10/24/2021   GFRAA 121 07/12/2020    Speciality Comments: PLQ Eye Exam: 11/23/2020 WNL @ [facility] follow up in 1 year  Procedures:  No procedures performed Allergies: Patient has no known allergies.   Assessment / Plan:     Visit Diagnoses: No diagnosis found.  Orders: No orders of the defined types were placed in this encounter.  No orders of the defined types were placed in this encounter.   Face-to-face time spent with patient was *** minutes. Greater than 50% of time was spent in counseling and coordination of care.  Follow-Up Instructions: No follow-ups on file.   Marissa C Graves, CMA  Note - This record has been created using Dragon software.  Chart creation errors have been sought, but may not always  have been located. Such creation errors do not reflect on  the standard of medical care.  

## 2021-12-12 ENCOUNTER — Encounter: Payer: Self-pay | Admitting: Rheumatology

## 2021-12-12 ENCOUNTER — Telehealth: Payer: Self-pay | Admitting: Rheumatology

## 2021-12-12 NOTE — Telephone Encounter (Signed)
Patient advised there is no drug interaction between Mucinex and hydroxychloroquine or methotrexate. If patient has signs/symptoms of infection, she should hold MTX until symptoms resolve and f/u with PCP as well. Patient expressed understanding.

## 2021-12-12 NOTE — Telephone Encounter (Signed)
Patient called stating she has a cold and wants to know if it is okay to take Mucinex with the medications Dr. Corliss Skains prescribes.  Patient's symptoms:  Headache, sore throat, congestion, body aches, fatigue.  Patient states she took an at home Covid test which was negative.  Patient requested a return call and if not able to answer please leave a voicemail.

## 2021-12-12 NOTE — Telephone Encounter (Signed)
There is no drug interaction between Mucinex and hydroxychloroquine or methotrexate. If patient has signs/symptoms of infection, she should hold MTX until symptoms resolve and f/u with PCP as well  Chesley Mires, PharmD, MPH, BCPS, CPP Clinical Pharmacist (Rheumatology and Pulmonology)

## 2021-12-17 ENCOUNTER — Ambulatory Visit: Payer: 59 | Admitting: Physician Assistant

## 2021-12-17 DIAGNOSIS — Z79899 Other long term (current) drug therapy: Secondary | ICD-10-CM

## 2021-12-17 DIAGNOSIS — E559 Vitamin D deficiency, unspecified: Secondary | ICD-10-CM

## 2021-12-17 DIAGNOSIS — G4709 Other insomnia: Secondary | ICD-10-CM

## 2021-12-17 DIAGNOSIS — F419 Anxiety disorder, unspecified: Secondary | ICD-10-CM

## 2021-12-17 DIAGNOSIS — I7300 Raynaud's syndrome without gangrene: Secondary | ICD-10-CM

## 2021-12-17 DIAGNOSIS — Z840 Family history of diseases of the skin and subcutaneous tissue: Secondary | ICD-10-CM

## 2021-12-17 DIAGNOSIS — Z8709 Personal history of other diseases of the respiratory system: Secondary | ICD-10-CM

## 2021-12-17 DIAGNOSIS — M3219 Other organ or system involvement in systemic lupus erythematosus: Secondary | ICD-10-CM

## 2021-12-17 DIAGNOSIS — D72818 Other decreased white blood cell count: Secondary | ICD-10-CM

## 2021-12-20 ENCOUNTER — Ambulatory Visit: Payer: 59 | Admitting: Physician Assistant

## 2021-12-21 ENCOUNTER — Other Ambulatory Visit: Payer: Self-pay | Admitting: Physician Assistant

## 2021-12-21 DIAGNOSIS — M3219 Other organ or system involvement in systemic lupus erythematosus: Secondary | ICD-10-CM

## 2021-12-23 NOTE — Telephone Encounter (Signed)
Next Visit: 01/21/2022  Last Visit: 09/17/2021  Labs: 10/24/2021 Absolute eosinophils are slightly low. Rest of CBC WNL.  CMP WNL.  Protein creatinine ratio WNL.   Vitamin D WNL.   ESR WNL. Complements WNL. ANA remains positive-titer unchanged. dsDNA is negative.   Eye exam: 11/23/2020 [facility] for patient to have f/u appt    Current Dose per office note 09/17/2021: Plaquenil 200 mg 1 tablet by mouth BID M-F.   DX: Other systemic lupus erythematosus with other organ involvement   Last Fill: 10/21/2021  Okay to refill Plaquenil?

## 2021-12-31 ENCOUNTER — Other Ambulatory Visit: Payer: Self-pay | Admitting: Rheumatology

## 2021-12-31 DIAGNOSIS — M3219 Other organ or system involvement in systemic lupus erythematosus: Secondary | ICD-10-CM

## 2021-12-31 MED ORDER — FOLIC ACID 1 MG PO TABS: 1.0000 mg | ORAL_TABLET | Freq: Every day | ORAL | 3 refills | Status: DC

## 2021-12-31 MED ORDER — METHOTREXATE 2.5 MG PO TABS: ORAL_TABLET | ORAL | 0 refills | Status: DC

## 2021-12-31 MED ORDER — HYDROXYCHLOROQUINE SULFATE 200 MG PO TABS: ORAL_TABLET | ORAL | 0 refills | Status: DC

## 2021-12-31 NOTE — Telephone Encounter (Signed)
Patient left a voicemail requesting her prescriptions for Folic Acid, Methotrexate, and Plaquenil be sent to [facility] in [place].   Patient states she usually gets them sent to [facility], but currently doesn't have insurance coverage so they need to be sent to her local [place].

## 2021-12-31 NOTE — Telephone Encounter (Signed)
Next Visit: 01/21/2022   Last Visit: 09/17/2021   Labs: 10/24/2021 Absolute eosinophils are slightly low. Rest of CBC WNL.  CMP WNL.  Protein creatinine ratio WNL.   Vitamin D WNL.   ESR WNL. Complements WNL. ANA remains positive-titer unchanged. dsDNA is negative.   PLQ Eye Exam: 11/04/2021 WNL  Current Dose per office note 09/17/2021: Plaquenil 200 mg 1 tablet by mouth BID M-F. Methotrexate 4 tablets by mouth once weekly, folic acid 1 mg daily   DX: Other systemic lupus erythematosus with other organ involvement    Last Fill: 10/23/2021 (MTX), 0/12/2723 (Folic Acid)  Okay to refill MTX, Folic Acid and PLQ?

## 2022-01-07 NOTE — Progress Notes (Deleted)
Office Visit Note  Patient: Brittany Preston             Date of Birth: 1996/02/03           MRN: [id number]             PCP: Bartholome Bill, MD Referring: Bartholome Bill, MD Visit Date: 01/21/2022 Occupation: _0 @  Subjective:  No chief complaint on file.   History of Present Illness: Brittany Preston is a 26 y.o. female ***   Activities of Daily Living:  Patient reports morning stiffness for *** {minute/hour:19697}.   Patient {ACTIONS;DENIES/REPORTS:21021675::"Denies"} nocturnal pain.  Difficulty dressing/grooming: {ACTIONS;DENIES/REPORTS:21021675::"Denies"} Difficulty climbing stairs: {ACTIONS;DENIES/REPORTS:21021675::"Denies"} Difficulty getting out of chair: {ACTIONS;DENIES/REPORTS:21021675::"Denies"} Difficulty using hands for taps, buttons, cutlery, and/or writing: {ACTIONS;DENIES/REPORTS:21021675::"Denies"}  No Rheumatology ROS completed.   PMFS History:  Patient Active Problem List   Diagnosis Date Noted  . Systemic lupus erythematosus ([place]) 11/10/2016  . Vitamin D deficiency 11/06/2016  . Hair loss 10/14/2016  . Leukopenia 10/14/2016  . High risk medication use 10/14/2016  . Moderate persistent asthma without complication 41/66/0630    Past Medical History:  Diagnosis Date  . Asthma   . Lupus ([place]) 06/2016  . Systemic lupus erythematosus ([facility])     Family History  Problem Relation Age of Onset  . Lupus Sister   . Kidney disease Sister   . Hypertension Sister    No past surgical history on file. Social History   Social History Narrative  . Not on file   Immunization History  Administered Date(s) Administered  . DTaP 03/04/1996, 06/27/1996, 10/07/1996, 07/07/1997, 01/03/2000  . HIB (PRP-OMP) 03/04/1996, 06/27/1996, 10/07/1996, 07/07/1997  . HIB (PRP-T) 03/04/1996, 06/27/1996, 10/07/1996, 07/07/1997  . HPV Quadrivalent 12/27/2008, 03/09/2009, 06/15/2009  . Hepatitis A 06/18/2005, 10/20/2006  . Hepatitis A, Ped/Adol-2 Dose 06/18/2005,  10/20/2006  . Hepatitis B 01/13/1996, 02/24/1996, 10/07/1996  . Hepatitis B, PED/ADOLESCENT 01/13/1996, 02/24/1996, 10/07/1996  . IPV 03/04/1996, 06/27/1996, 10/07/1996, 01/03/2000  . Influenza,inj,Quad PF,6+ Mos 12/10/2015  . Influenza-Unspecified 12/10/2015, 01/07/2017  . MMR 12/30/1996, 01/03/2000  . Meningococcal Conjugate 12/27/2007, 03/29/2013  . PFIZER(Purple Top)SARS-COV-2 Vaccination 06/10/2019, 07/01/2019, 01/23/2020  . Pneumococcal Conjugate-13 01/03/2000  . Pneumococcal Polysaccharide-23 08/04/2016  . Tdap 10/20/2006  . Unspecified SARS-COV-2 Vaccination 06/10/2019, 07/01/2019, 01/23/2020  . Varicella 12/30/1996, 06/18/2005     Objective: Vital Signs: There were no vitals taken for this visit.   Physical Exam   Musculoskeletal Exam: ***  CDAI Exam: CDAI Score: -- Patient Global: --; Provider Global: -- Swollen: --; Tender: -- Joint Exam 01/21/2022   No joint exam has been documented for this visit   There is currently no information documented on the homunculus. Go to the Rheumatology activity and complete the homunculus joint exam.  Investigation: No additional findings.  Imaging: No results found.  Recent Labs: Lab Results  Component Value Date   WBC 4.2 10/24/2021   HGB 12.7 10/24/2021   PLT 270 10/24/2021   NA 139 10/24/2021   K 4.0 10/24/2021   CL 106 10/24/2021   CO2 26 10/24/2021   GLUCOSE 76 10/24/2021   BUN 12 10/24/2021   CREATININE 0.73 10/24/2021   BILITOT 0.3 10/24/2021   ALKPHOS 34 11/13/2016   AST 14 10/24/2021   ALT 11 10/24/2021   PROT 7.7 10/24/2021   ALBUMIN 4.3 11/13/2016   CALCIUM 9.7 10/24/2021   GFRAA 121 07/12/2020    Speciality Comments: PLQ Eye Exam: 11/04/2021 WNL @ [facility] follow up in 1 year  Procedures:  No  procedures performed Allergies: Patient has no known allergies.   Assessment / Plan:     Visit Diagnoses: No diagnosis found.  Orders: No orders of the defined types were placed in this  encounter.  No orders of the defined types were placed in this encounter.   Face-to-face time spent with patient was *** minutes. Greater than 50% of time was spent in counseling and coordination of care.  Follow-Up Instructions: No follow-ups on file.   Earnestine Mealing, CMA  Note - This record has been created using Editor, commissioning.  Chart creation errors have been sought, but may not always  have been located. Such creation errors do not reflect on  the standard of medical care.

## 2022-01-21 ENCOUNTER — Ambulatory Visit: Payer: 59 | Admitting: Physician Assistant

## 2022-01-21 DIAGNOSIS — M3219 Other organ or system involvement in systemic lupus erythematosus: Secondary | ICD-10-CM

## 2022-01-21 DIAGNOSIS — G4709 Other insomnia: Secondary | ICD-10-CM

## 2022-01-21 DIAGNOSIS — Z79899 Other long term (current) drug therapy: Secondary | ICD-10-CM

## 2022-01-21 DIAGNOSIS — Z8709 Personal history of other diseases of the respiratory system: Secondary | ICD-10-CM

## 2022-01-21 DIAGNOSIS — Z840 Family history of diseases of the skin and subcutaneous tissue: Secondary | ICD-10-CM

## 2022-01-21 DIAGNOSIS — I7300 Raynaud's syndrome without gangrene: Secondary | ICD-10-CM

## 2022-01-21 DIAGNOSIS — F419 Anxiety disorder, unspecified: Secondary | ICD-10-CM

## 2022-01-21 DIAGNOSIS — D72818 Other decreased white blood cell count: Secondary | ICD-10-CM

## 2022-01-21 DIAGNOSIS — E559 Vitamin D deficiency, unspecified: Secondary | ICD-10-CM

## 2022-02-12 NOTE — Progress Notes (Unsigned)
Office Visit Note  Patient: Brittany Preston             Date of Birth: 09/21/1995           MRN: [id number]             PCP: Bartholome Bill, MD Referring: Bartholome Bill, MD Visit Date: 02/26/2022 Occupation: _0 @  Subjective:  Aching   History of Present Illness: Brittany Preston is a 26 y.o. female with history of systemic lupus erythematosus.  She is taking Methotrexate 4 tablets by mouth once weekly, folic acid 1 mg daily, and Plaquenil 200 mg 1 tablet by mouth BID M-F.  She is tolerating combination therapy without any side effects and has not missed any doses recently.  Patient reports that she has been very busy at work working at [employer].  She states that after a long shift or several consecutive shifts she notices increased generalized aching and soreness.  Most of her discomfort tends to be in her knee joints.  She states that she has morning stiffness in her knees lasting 20 to 30 minutes.  She will occasionally take an Epsom salt bath which sues her arthralgias and myalgias.  She denies any joint swelling.  She states that her joint pain is typically provoked by visible activity at work. She denies any signs or symptoms of a systemic lupus flare.  She has not had any sores in her mouth or nose.  She denies any sicca symptoms.  She has not noticed any hair loss.  She has noticed some scaling and itching along her hairline and requested a refill of hydrocortisone cream to be sent to the pharmacy which has previously alleviated the symptoms.  She denies any butterfly rash.  She has intermittent symptoms of raynaud's especially with exposure to cold temperatures. She denies any shortness of breath, palpitations, or chest pain.  She denies any recent or recurrent infections.  She has received the annual flu shot and is planning on receiving the COVID-19 booster.   Activities of Daily Living:  Patient reports morning stiffness for 20-30 minutes   Patient Denies nocturnal  pain.  Difficulty dressing/grooming: Denies Difficulty climbing stairs: Denies Difficulty getting out of chair: Denies Difficulty using hands for taps, buttons, cutlery, and/or writing: Denies  Review of Systems  Constitutional:  Positive for fatigue.  HENT:  Negative for mouth sores, mouth dryness and nose dryness.   Eyes:  Negative for pain, visual disturbance and dryness.  Respiratory:  Negative for cough, hemoptysis, shortness of breath and difficulty breathing.   Cardiovascular:  Negative for chest pain, palpitations, hypertension and swelling in legs/feet.  Gastrointestinal:  Positive for constipation and heartburn. Negative for blood in stool and diarrhea.  Endocrine: Negative for increased urination.  Genitourinary:  Negative for painful urination.  Musculoskeletal:  Positive for joint pain, joint pain and morning stiffness. Negative for joint swelling, myalgias, muscle weakness, muscle tenderness and myalgias.  Skin:  Negative for color change, pallor, rash, hair loss, nodules/bumps, skin tightness, ulcers and sensitivity to sunlight.  Neurological:  Negative for dizziness, numbness, headaches and weakness.  Hematological:  Negative for swollen glands.  Psychiatric/Behavioral:  Negative for depressed mood and sleep disturbance. The patient is not nervous/anxious.     PMFS History:  Patient Active Problem List   Diagnosis Date Noted   Systemic lupus erythematosus ([place]) 11/10/2016   Vitamin D deficiency 11/06/2016   Hair loss 10/14/2016   Leukopenia 10/14/2016   High risk medication use 10/14/2016  Moderate persistent asthma without complication 01/60/1093    Past Medical History:  Diagnosis Date   Asthma    Lupus () 06/2016   Systemic lupus erythematosus ([place])     Family History  Problem Relation Age of Onset   Lupus Sister    Kidney disease Sister    Hypertension Sister    History reviewed. No pertinent surgical history. Social History   Social History  Narrative   Not on file   Immunization History  Administered Date(s) Administered   DTaP 03/04/1996, 06/27/1996, 10/07/1996, 07/07/1997, 01/03/2000   HIB (PRP-OMP) 03/04/1996, 06/27/1996, 10/07/1996, 07/07/1997   HIB (PRP-T) 03/04/1996, 06/27/1996, 10/07/1996, 07/07/1997   HPV Quadrivalent 12/27/2008, 03/09/2009, 06/15/2009   Hepatitis A 06/18/2005, 10/20/2006   Hepatitis A, Ped/Adol-2 Dose 06/18/2005, 10/20/2006   Hepatitis B 01/13/1996, 02/24/1996, 10/07/1996   Hepatitis B, PED/ADOLESCENT 01/13/1996, 02/24/1996, 10/07/1996   IPV 03/04/1996, 06/27/1996, 10/07/1996, 01/03/2000   Influenza,inj,Quad PF,6+ Mos 12/10/2015   Influenza-Unspecified 12/10/2015, 01/07/2017   MMR 12/30/1996, 01/03/2000   Meningococcal Conjugate 12/27/2007, 03/29/2013   PFIZER(Purple Top)SARS-COV-2 Vaccination 06/10/2019, 07/01/2019, 01/23/2020   Pneumococcal Conjugate-13 01/03/2000   Pneumococcal Polysaccharide-23 08/04/2016   Tdap 10/20/2006   Unspecified SARS-COV-2 Vaccination 06/10/2019, 07/01/2019, 01/23/2020   Varicella 12/30/1996, 06/18/2005     Objective: Vital Signs: BP 117/78 (BP Location: Left Arm, Patient Position: Sitting, Cuff Size: Small)   Pulse 96   Resp 12   Ht _0  (1.575 m)   Wt 160 lb (72.6 kg)   BMI 29.26 kg/m    Physical Exam Vitals and nursing note reviewed.  Constitutional:      Appearance: She is well-developed.  HENT:     Head: Normocephalic and atraumatic.  Eyes:     Conjunctiva/sclera: Conjunctivae normal.  Cardiovascular:     Rate and Rhythm: Normal rate and regular rhythm.     Heart sounds: Normal heart sounds.  Pulmonary:     Effort: Pulmonary effort is normal.     Breath sounds: Normal breath sounds.  Abdominal:     General: Bowel sounds are normal.     Palpations: Abdomen is soft.  Musculoskeletal:     Cervical back: Normal range of motion.  Skin:    General: Skin is warm and dry.     Capillary Refill: Capillary refill takes 2 to 3 seconds.      Comments: No malar rash  No digital ulcers   Neurological:     Mental Status: She is alert and oriented to person, place, and time.  Psychiatric:        Behavior: Behavior normal.      Musculoskeletal Exam: C-spine, thoracic spine, and lumbar spine good ROM.  Shoulder joints, elbow joints, wrist joints, MCPs, PIPs, and DIPs good ROM with no synovitis.  Complete fist formation bilaterally.  Hip joints, knee joints, and ankle joints have good ROM with no discomfort.  No warmth or effusion of knee joints.  No tenderness or swelling of ankle joints.   CDAI Exam: CDAI Score: -- Patient Global: --; Provider Global: -- Swollen: --; Tender: -- Joint Exam 02/26/2022   No joint exam has been documented for this visit   There is currently no information documented on the homunculus. Go to the Rheumatology activity and complete the homunculus joint exam.  Investigation: No additional findings.  Imaging: No results found.  Recent Labs: Lab Results  Component Value Date   WBC 4.2 10/24/2021   HGB 12.7 10/24/2021   PLT 270 10/24/2021   NA 139 10/24/2021   K  4.0 10/24/2021   CL 106 10/24/2021   CO2 26 10/24/2021   GLUCOSE 76 10/24/2021   BUN 12 10/24/2021   CREATININE 0.73 10/24/2021   BILITOT 0.3 10/24/2021   ALKPHOS 34 11/13/2016   AST 14 10/24/2021   ALT 11 10/24/2021   PROT 7.7 10/24/2021   ALBUMIN 4.3 11/13/2016   CALCIUM 9.7 10/24/2021   GFRAA 121 07/12/2020    Speciality Comments: PLQ Eye Exam: 11/04/2021 WNL @ [facility] follow up in 1 year  Procedures:  No procedures performed Allergies: Patient has no known allergies.    Assessment / Plan:     Visit Diagnoses: Other systemic lupus erythematosus with other organ involvement ([place]) - History of fatigue, weight loss, hair loss, malar rash, Raynauds, skin lupus, neutropenia, elevated sedimentation rate, positive ANA,ds DNA, Sm, Ro, low C3: Patient is not currently experiencing any signs or symptoms of a systemic lupus  flare.  She has clinically been doing well taking methotrexate 4 tablets by mouth once weekly along with folic acid 1 mg daily and Plaquenil 200 mg 1 tablet by mouth twice daily Monday through Friday.  She is tolerating combination therapy without any side effects and has not missed any doses recently.   She has been experiencing some increased arthralgias and joint stiffness which she attributes to working long shifts at [employer].  Her discomfort is typically in both shoulders and both knee joints after strenuous activities.  Her morning stiffness has been lasting 20 to 30 minutes in both knees.  She has not noticed any joint swelling.  On examination no synovitis was noted.  She has not been taking any over-the-counter products for pain relief.  She typically soaks in Epsom salt bath if needed. She has not had any oral or nasal ulcerations.  No sicca symptoms.  No cervical lymphadenopathy.  She has not had any shortness of breath, pleuritic chest pain, or palpitations.  Overall her energy level has been stable.  She experiences intermittent symptoms of Raynaud's phenomenon with cooler weather temperatures.  On examination capillary fill is 2 to 3 seconds but no digital ulcerations or signs of gangrene were noted.  Discussed the importance of keeping her core body temperature warm, wearing gloves and socks, and avoiding triggers this winter. No Malar rash was noted on examination today.  Discussed the importance of avoiding direct sun exposure and wearing sunscreen SPF 50 on a daily basis. Lab work from 10/24/2021 was reviewed today in the office: ANA positive 1:320NS, protein creatinine ratio within normal limits, double-stranded DNA negative, complements within normal limits, ESR within normal limits, and vitamin D 72. Results were discussed with the patient today in the office.  The following lab work will be obtained today for further evaluation.  She will remain on the current treatment regimen.  She  was advised to notify us if she develops any new or worsening symptoms.  She will follow up in the office in 3 months or sooner if needed.  - Plan: Protein / creatinine ratio, urine, CBC with Differential/Platelet, COMPLETE METABOLIC PANEL WITH GFR, C3 and C4, Sedimentation rate, Anti-DNA antibody, double-stranded  High risk medication use - Methotrexate 4 tablets by mouth once weekly, folic acid 1 mg daily, and Plaquenil 200 mg 1 tablet by mouth BID M-F.  PLQ Eye Exam: 11/04/2021 WNL @ [facility] follow up in 1 year.  CBC and CMP updated on 10/24/21.  Orders for CBC and CMP released today.  - Plan: CBC with Differential/Platelet, COMPLETE METABOLIC  PANEL WITH GFR She has not had any recent or recurrent infections. She has received the annual flu shot.  She is planning on receiving the COVID-19 booster.  Discussed the importance of holding methotrexate 1 week after receiving the COVID booster.  Raynaud's syndrome without gangrene: Capillary refill 2-3 seconds.  She experiences intermittent symptoms of Raynaud's phenomenon.  No digital ulcerations or signs of gangrene were noted on examination today.  Treatment options were discussed today in detail.  She was advised to notify us if she develops any new or worsening symptoms.  Other decreased white blood cell (WBC) count: WBC count was WNL-4.2.  Absolute eosinophils are borderline low-8.  CBC with diff updated today.   Other medical conditions are listed as follows:  History of asthma  Anxiety  Other insomnia  Vitamin D deficiency: She is taking vitamin D 2000 units daily.   Family history of lupus erythematosus-Sister.   Orders: Orders Placed This Encounter  Procedures   Protein / creatinine ratio, urine   CBC with Differential/Platelet   COMPLETE METABOLIC PANEL WITH GFR   C3 and C4   Sedimentation rate   Anti-DNA antibody, double-stranded   Meds ordered this encounter  Medications   hydrocortisone cream 1 %    Sig: Apply  topically 2 (two) times daily as needed.    Dispense:  30 g    Refill:  1     Follow-Up Instructions: Return in about 3 months (around 05/29/2022) for Systemic lupus erythematosus.   Ofilia Neas, PA-C  Note - This record has been created using Dragon software.  Chart creation errors have been sought, but may not always  have been located. Such creation errors do not reflect on  the standard of medical care.

## 2022-02-26 ENCOUNTER — Encounter: Payer: Self-pay | Admitting: Physician Assistant

## 2022-02-26 ENCOUNTER — Ambulatory Visit: Payer: 59 | Attending: Physician Assistant | Admitting: Physician Assistant

## 2022-02-26 VITALS — BP 117/78 | HR 96 | Resp 12 | Ht 62.0 in | Wt 160.0 lb

## 2022-02-26 DIAGNOSIS — Z79899 Other long term (current) drug therapy: Secondary | ICD-10-CM | POA: Diagnosis not present

## 2022-02-26 DIAGNOSIS — Z8709 Personal history of other diseases of the respiratory system: Secondary | ICD-10-CM

## 2022-02-26 DIAGNOSIS — D72818 Other decreased white blood cell count: Secondary | ICD-10-CM | POA: Diagnosis not present

## 2022-02-26 DIAGNOSIS — M3219 Other organ or system involvement in systemic lupus erythematosus: Secondary | ICD-10-CM

## 2022-02-26 DIAGNOSIS — Z840 Family history of diseases of the skin and subcutaneous tissue: Secondary | ICD-10-CM

## 2022-02-26 DIAGNOSIS — I7300 Raynaud's syndrome without gangrene: Secondary | ICD-10-CM | POA: Diagnosis not present

## 2022-02-26 DIAGNOSIS — G4709 Other insomnia: Secondary | ICD-10-CM

## 2022-02-26 DIAGNOSIS — E559 Vitamin D deficiency, unspecified: Secondary | ICD-10-CM

## 2022-02-26 DIAGNOSIS — F419 Anxiety disorder, unspecified: Secondary | ICD-10-CM

## 2022-02-26 MED ORDER — HYDROCORTISONE 1 % EX CREA: TOPICAL_CREAM | Freq: Two times a day (BID) | CUTANEOUS | 1 refills | Status: AC | PRN

## 2022-02-27 LAB — CBC WITH DIFFERENTIAL/PLATELET
Absolute Monocytes: 333 cells/uL (ref 200–950)
Basophils Absolute: 19 cells/uL (ref 0–200)
Basophils Relative: 0.5 %
Eosinophils Absolute: 19 cells/uL (ref 15–500)
Eosinophils Relative: 0.5 %
HCT: 39.6 % (ref 35.0–45.0)
Hemoglobin: 13.4 g/dL (ref 11.7–15.5)
Lymphs Abs: 1306 cells/uL (ref 850–3900)
MCH: 31.1 pg (ref 27.0–33.0)
MCHC: 33.8 g/dL (ref 32.0–36.0)
MCV: 91.9 fL (ref 80.0–100.0)
MPV: 9.8 fL (ref 7.5–12.5)
Monocytes Relative: 9 %
Neutro Abs: 2024 cells/uL (ref 1500–7800)
Neutrophils Relative %: 54.7 %
Platelets: 255 10*3/uL (ref 140–400)
RBC: 4.31 10*6/uL (ref 3.80–5.10)
RDW: 12.7 % (ref 11.0–15.0)
Total Lymphocyte: 35.3 %
WBC: 3.7 10*3/uL — ABNORMAL LOW (ref 3.8–10.8)

## 2022-02-27 LAB — COMPLETE METABOLIC PANEL WITH GFR
AG Ratio: 1.4 (calc) (ref 1.0–2.5)
ALT: 17 U/L (ref 6–29)
AST: 19 U/L (ref 10–30)
Albumin: 4.9 g/dL (ref 3.6–5.1)
Alkaline phosphatase (APISO): 42 U/L (ref 31–125)
BUN: 12 mg/dL (ref 7–25)
CO2: 25 mmol/L (ref 20–32)
Calcium: 9.7 mg/dL (ref 8.6–10.2)
Chloride: 104 mmol/L (ref 98–110)
Creat: 0.75 mg/dL (ref 0.50–0.96)
Globulin: 3.4 g/dL (calc) (ref 1.9–3.7)
Glucose, Bld: 69 mg/dL (ref 65–139)
Potassium: 4 mmol/L (ref 3.5–5.3)
Sodium: 137 mmol/L (ref 135–146)
Total Bilirubin: 0.4 mg/dL (ref 0.2–1.2)
Total Protein: 8.3 g/dL — ABNORMAL HIGH (ref 6.1–8.1)
eGFR: 113 mL/min/{1.73_m2} (ref >=60)

## 2022-02-27 LAB — PROTEIN / CREATININE RATIO, URINE
Creatinine, Urine: 179 mg/dL (ref 20–275)
Protein/Creat Ratio: 73 mg/g creat (ref 24–184)
Protein/Creatinine Ratio: 0.073 mg/mg creat (ref 0.024–0.184)
Total Protein, Urine: 13 mg/dL (ref 5–24)

## 2022-02-27 LAB — C3 AND C4
C3 Complement: 134 mg/dL (ref 83–193)
C4 Complement: 29 mg/dL (ref 15–57)

## 2022-02-27 LAB — SEDIMENTATION RATE: Sed Rate: 14 mm/h (ref 0–20)

## 2022-02-27 LAB — ANTI-DNA ANTIBODY, DOUBLE-STRANDED: ds DNA Ab: 2 IU/mL

## 2022-02-27 NOTE — Progress Notes (Signed)
dsDNA is negative.  Labs are not consistent with a flare.  No change in therapy recommended at this time.

## 2022-02-27 NOTE — Progress Notes (Signed)
Protein creatinine ratio WNL.  ESR and complements WNL.  WBC count is borderline low-3.7.  rest of CBC WNL.  Total protein is borderline elevated. Rest of CMP WNL.   We will continue to monitor lab work closely.

## 2022-03-26 ENCOUNTER — Other Ambulatory Visit: Payer: Self-pay | Admitting: *Deleted

## 2022-03-26 ENCOUNTER — Telehealth: Payer: Self-pay | Admitting: Rheumatology

## 2022-03-26 MED ORDER — METHOTREXATE SODIUM 2.5 MG PO TABS: 10.0000 mg | ORAL_TABLET | ORAL | 0 refills | Status: DC

## 2022-03-26 NOTE — Telephone Encounter (Signed)
Next Visit: 05/23/2022  Last Visit: 02/26/2022  Last Fill: 12/31/2021  DX: Other systemic lupus erythematosus with other organ involvement   Current Dose per office note 02/26/2022: Methotrexate 4 tablets by mouth once weekly,   Labs: 02/26/2202  Protein creatinine ratio WNL. ESR and complements WNL. WBC count is borderline low-3.7.  rest of CBC WNL. Total protein is borderline elevated. Rest of CMP WNL.   We will continue to monitor lab work closely.   dsDNA is negative.  Labs are not consistent with a flare.  No change in therapy recommended at this time.   Okay to refill MTX?

## 2022-03-26 NOTE — Telephone Encounter (Signed)
Patient called the office requesting a refill of Methotrexate to be sent to [facility] in [place]. Patient states she is due to take her MTX on Friday and is completely out of medication.

## 2022-05-09 NOTE — Progress Notes (Signed)
Office Visit Note  Patient: Brittany Preston             Date of Birth: 1995/07/06           MRN: [id number]             PCP: Bartholome Bill, MD Referring: Bartholome Bill, MD Visit Date: 05/23/2022 Occupation: '@GUAROCC'$ @  Subjective:  Medication monitoring   History of Present Illness: Brittany Preston is a 27 y.o. female with history of systemic lupus erythematosus.  Patient remains on Methotrexate 4 tablets by mouth once weekly, folic acid 1 mg daily, and Plaquenil 200 mg 1 tablet by mouth BID M-F.  She is tolerating combination therapy without any side effects and has not missed any doses recently.  She denies any signs or symptoms of a systemic lupus flare.  She has not noticed any new or worsening symptoms since her last office visit.  Patient reports that she has occasional discomfort in her knees but describes it more as a muscular fatigue.  She denies any warmth or swelling in her knee joints.  She denies any other joint pain or joint swelling currently.  Her energy level has been stable.  She denies any symptoms of Raynaud's phenomenon.  She has not had any sores in her mouth or nose.  She has occasional dry mouth but no dry eyes.  She has not had any recent rashes or increased hair loss.  She has been trying to avoid direct sun exposure.  She denies any swollen lymph nodes.  She has not had any recent infections.  She denies any new medical conditions.   Activities of Daily Living:  Patient reports morning stiffness for 5-10 minutes.   Patient Denies nocturnal pain.  Difficulty dressing/grooming: Denies Difficulty climbing stairs: Denies Difficulty getting out of chair: Denies Difficulty using hands for taps, buttons, cutlery, and/or writing: Denies  Review of Systems  Constitutional: Negative.  Negative for fatigue.  HENT:  Positive for mouth dryness. Negative for mouth sores.   Eyes: Negative.  Negative for dryness.  Respiratory: Negative.  Negative for shortness of  breath.   Cardiovascular:  Positive for palpitations. Negative for chest pain.  Gastrointestinal: Negative.  Negative for blood in stool, constipation and diarrhea.  Endocrine: Negative.  Negative for increased urination.  Genitourinary: Negative.  Negative for involuntary urination.  Musculoskeletal:  Positive for morning stiffness. Negative for joint pain, gait problem, joint pain, joint swelling, myalgias, muscle weakness, muscle tenderness and myalgias.  Skin: Negative.  Negative for color change, rash, hair loss and sensitivity to sunlight.  Allergic/Immunologic: Negative.  Negative for susceptible to infections.  Neurological: Negative.  Negative for dizziness and headaches.  Hematological: Negative.  Negative for swollen glands.  Psychiatric/Behavioral: Negative.  Negative for depressed mood and sleep disturbance. The patient is not nervous/anxious.     PMFS History:  Patient Active Problem List   Diagnosis Date Noted   Systemic lupus erythematosus ([place]) 11/10/2016   Vitamin D deficiency 11/06/2016   Hair loss 10/14/2016   Leukopenia 10/14/2016   High risk medication use 10/14/2016   Moderate persistent asthma without complication [id number]    Past Medical History:  Diagnosis Date   Asthma    Lupus ([place]) 06/2016   Systemic lupus erythematosus ([place])     Family History  Problem Relation Age of Onset   Lupus Sister    Kidney disease Sister    Hypertension Sister    History reviewed. No pertinent surgical history. Social History  Social History Narrative   Not on file   Immunization History  Administered Date(s) Administered   DTaP 03/04/1996, 06/27/1996, 10/07/1996, 07/07/1997, 01/03/2000   HIB (PRP-OMP) 03/04/1996, 06/27/1996, 10/07/1996, 07/07/1997   HIB (PRP-T) 03/04/1996, 06/27/1996, 10/07/1996, 07/07/1997   HPV Quadrivalent 12/27/2008, 03/09/2009, 06/15/2009   Hepatitis A 06/18/2005, 10/20/2006   Hepatitis A, Ped/Adol-2 Dose 06/18/2005, 10/20/2006    Hepatitis B 01/13/1996, 02/24/1996, 10/07/1996   Hepatitis B, PED/ADOLESCENT 01/13/1996, 02/24/1996, 10/07/1996   IPV 03/04/1996, 06/27/1996, 10/07/1996, 01/03/2000   Influenza,inj,Quad PF,6+ Mos 12/10/2015   Influenza-Unspecified 12/10/2015, 01/07/2017   MMR 12/30/1996, 01/03/2000   Meningococcal Conjugate 12/27/2007, 03/29/2013   PFIZER(Purple Top)SARS-COV-2 Vaccination 06/10/2019, 07/01/2019, 01/23/2020   Pneumococcal Conjugate-13 01/03/2000   Pneumococcal Polysaccharide-23 08/04/2016   Tdap 10/20/2006   Unspecified SARS-COV-2 Vaccination 06/10/2019, 07/01/2019, 01/23/2020   Varicella 12/30/1996, 06/18/2005     Objective: Vital Signs: BP 115/78 (BP Location: Left Arm, Patient Position: Sitting, Cuff Size: Normal)   Pulse 86   Resp 16   Ht '5\' 2"'$  (1.575 m)   Wt 162 lb (73.5 kg)   BMI 29.63 kg/m    Physical Exam Vitals and nursing note reviewed.  Constitutional:      Appearance: She is well-developed.  HENT:     Head: Normocephalic and atraumatic.     Mouth/Throat:     Comments: No parotid swelling or tenderness  Eyes:     Conjunctiva/sclera: Conjunctivae normal.  Cardiovascular:     Rate and Rhythm: Normal rate and regular rhythm.     Heart sounds: Normal heart sounds.  Pulmonary:     Effort: Pulmonary effort is normal.     Breath sounds: Normal breath sounds.  Abdominal:     General: Bowel sounds are normal.     Palpations: Abdomen is soft.  Musculoskeletal:     Cervical back: Normal range of motion.  Lymphadenopathy:     Cervical: No cervical adenopathy.  Skin:    General: Skin is warm and dry.     Capillary Refill: Capillary refill takes less than 2 seconds.     Comments: No malar rash  No digital ulcers or signs of cyanosis   Neurological:     Mental Status: She is alert and oriented to person, place, and time.  Psychiatric:        Behavior: Behavior normal.      Musculoskeletal Exam: C-spine, thoracic spine, and lumbar spine good ROM.  Shoulder joints,  elbow joints, wrist joints, MCPs, PIPs, and DIPs good ROM with no synovitis.  Complete fist formation bilaterally.  Hip joints, knee joints, and ankle joints have good ROM with no discomfort.  No warmth or effusion of knee joints.  No tenderness or swelling of ankle joints.    CDAI Exam: CDAI Score: -- Patient Global: --; Provider Global: -- Swollen: --; Tender: -- Joint Exam 05/23/2022   No joint exam has been documented for this visit   There is currently no information documented on the homunculus. Go to the Rheumatology activity and complete the homunculus joint exam.  Investigation: No additional findings.  Imaging: No results found.  Recent Labs: Lab Results  Component Value Date   WBC 3.7 (L) 02/26/2022   HGB 13.4 02/26/2022   PLT 255 02/26/2022   NA 137 02/26/2022   K 4.0 02/26/2022   CL 104 02/26/2022   CO2 25 02/26/2022   GLUCOSE 69 02/26/2022   BUN 12 02/26/2022   CREATININE 0.75 02/26/2022   BILITOT 0.4 02/26/2022   ALKPHOS 34 11/13/2016  AST 19 02/26/2022   ALT 17 02/26/2022   PROT 8.3 (H) 02/26/2022   ALBUMIN 4.3 11/13/2016   CALCIUM 9.7 02/26/2022   GFRAA 121 07/12/2020    Speciality Comments: PLQ Eye Exam: 11/04/2021 WNL @ [facility] follow up in 1 year  Procedures:  No procedures performed Allergies: Patient has no known allergies.      Assessment / Plan:     Visit Diagnoses: Other systemic lupus erythematosus with other organ involvement ([place]) - History of fatigue, weight loss, hair loss, malar rash, Raynauds, skin lupus, neutropenia, elevated sedimentation rate, positive ANA,ds DNA, Sm, Ro, low C3: She has not had any signs or symptoms of a systemic lupus flare since her last office visit.  She has clinically been doing well taking methotrexate 4 tablets by mouth once weekly along with folic acid 1 mg daily and Plaquenil 200 mg 1 tablet by mouth twice daily Monday through Friday.  She has been tolerating combination therapy without any side  effects and has not missed any doses recently.  She has not had any symptoms of Raynaud's phenomenon.  No Malar rash noted.  No signs of alopecia.  No cervical lymphadenopathy.  No synovitis noted on exam.  She has not had any oral or nasal ulcerations.  She has occasional symptoms of dry mouth so the use of over-the-counter products were discussed today. No parotid swelling or tenderness noted.  Discussed the importance of avoiding direct sun exposure and wearing sunscreen SPF 50 on a daily basis. Lab work from 02/26/22 was reviewed today in the office: dsDNA negative, complements WNL, ESR WNL,  WBC count 3.7.  Results were not consistent with a flare.  The following lab work will be updated today. She will remain on the current treatment regimen. She was advised to notify us if she develops any signs or symptoms of a flare.  She will follow up in 5 months or sooner if needed but she will continue to require lab work every 3 months.  Future lab orders were placed today to be drawn in May.  - Plan: COMPLETE METABOLIC PANEL WITH GFR, CBC with Differential/Platelet, Protein / creatinine ratio, urine, Anti-DNA antibody, double-stranded, C3 and C4, Sedimentation rate  High risk medication use - Methotrexate 4 tablets by mouth once weekly, folic acid 1 mg daily, and Plaquenil 200 mg 1 tablet by mouth BID M-F. PLQ Eye Exam: 11/04/2021 WNL @ [facility]. PLQ Eye Exam: 11/04/2021 WNL @ [facility] follow up in 1 year.   CBC and CMP updated on 02/26/22.  Orders for CBC and CMP released today. She will continue to require updated lab work every 3 months to monitor for drug toxicity. No recent or recurrent infections.  - Plan: COMPLETE METABOLIC PANEL WITH GFR, CBC with Differential/Platelet  Raynaud's syndrome without gangrene: Not currently active.  No digital ulcers or signs of gangrene.    Knee joint stiffness, bilateral: She has good ROM of both knee joints on examination today.  No warmth or effusion noted.    Other decreased white blood cell (WBC) count -WBC count was borderline low-3.7 on 02/26/22.  Rest of CBC WNL.  CBC with diff updated today.  Plan: CBC with Differential/Platelet  Vitamin D deficiency: Vitamin D was 72 on 10/24/21.  She is taking vitamin D 2,000 units daily.    Other medical conditions are listed as follows:   History of asthma  Anxiety  Other insomnia: She has been sleeping well at night. Energy level  has been stable.   Family history of lupus erythematosus  BMI 31.0-31.9,adult  Orders: Orders Placed This Encounter  Procedures   COMPLETE METABOLIC PANEL WITH GFR   CBC with Differential/Platelet   Protein / creatinine ratio, urine   Anti-DNA antibody, double-stranded   C3 and C4   Sedimentation rate   Protein / creatinine ratio, urine   CBC with Differential/Platelet   COMPLETE METABOLIC PANEL WITH GFR   Anti-DNA antibody, double-stranded   C3 and C4   Sedimentation rate   No orders of the defined types were placed in this encounter.     Follow-Up Instructions: Return in about 5 months (around 10/21/2022) for Systemic lupus erythematosus.   Ofilia Neas, PA-C  Note - This record has been created using Dragon software.  Chart creation errors have been sought, but may not always  have been located. Such creation errors do not reflect on  the standard of medical care.,

## 2022-05-23 ENCOUNTER — Encounter: Payer: Self-pay | Admitting: Physician Assistant

## 2022-05-23 ENCOUNTER — Ambulatory Visit: Payer: 59 | Attending: Physician Assistant | Admitting: Physician Assistant

## 2022-05-23 VITALS — BP 115/78 | HR 86 | Resp 16 | Ht 62.0 in | Wt 162.0 lb

## 2022-05-23 DIAGNOSIS — E559 Vitamin D deficiency, unspecified: Secondary | ICD-10-CM

## 2022-05-23 DIAGNOSIS — Z840 Family history of diseases of the skin and subcutaneous tissue: Secondary | ICD-10-CM

## 2022-05-23 DIAGNOSIS — Z8709 Personal history of other diseases of the respiratory system: Secondary | ICD-10-CM

## 2022-05-23 DIAGNOSIS — G4709 Other insomnia: Secondary | ICD-10-CM

## 2022-05-23 DIAGNOSIS — I7300 Raynaud's syndrome without gangrene: Secondary | ICD-10-CM

## 2022-05-23 DIAGNOSIS — Z79899 Other long term (current) drug therapy: Secondary | ICD-10-CM

## 2022-05-23 DIAGNOSIS — M3219 Other organ or system involvement in systemic lupus erythematosus: Secondary | ICD-10-CM | POA: Diagnosis not present

## 2022-05-23 DIAGNOSIS — M25661 Stiffness of right knee, not elsewhere classified: Secondary | ICD-10-CM | POA: Diagnosis not present

## 2022-05-23 DIAGNOSIS — Z6831 Body mass index (BMI) 31.0-31.9, adult: Secondary | ICD-10-CM

## 2022-05-23 DIAGNOSIS — M25662 Stiffness of left knee, not elsewhere classified: Secondary | ICD-10-CM

## 2022-05-23 DIAGNOSIS — F419 Anxiety disorder, unspecified: Secondary | ICD-10-CM

## 2022-05-23 DIAGNOSIS — D72818 Other decreased white blood cell count: Secondary | ICD-10-CM

## 2022-05-23 NOTE — Patient Instructions (Signed)
Standing Labs We placed an order today for your standing lab work.   Please have your standing labs drawn in May and every 3 months   Please have your labs drawn 2 weeks prior to your appointment so that the provider can discuss your lab results at your appointment, if possible.  Please note that you may see your imaging and lab results in [place] before we have reviewed them. We will contact you once all results are reviewed. Please allow our office up to 72 hours to thoroughly review all of the results before contacting the office for clarification of your results.  WALK-IN LAB HOURS  Monday through Thursday from 8:00 am -12:30 pm and 1:00 pm-5:00 pm and Friday from 8:00 am-12:00 pm.  Patients with office visits requiring labs will be seen before walk-in labs.  You may encounter longer than normal wait times. Please allow additional time. Wait times may be shorter on  Monday and Thursday afternoons.  We do not book appointments for walk-in labs. We appreciate your patience and understanding with our staff.   Labs are drawn by [facility]. Please bring your co-pay at the time of your lab draw.  You may receive a bill from [facility] for your lab work.  Please note if you are on Hydroxychloroquine and and an order has been placed for a Hydroxychloroquine level,  you will need to have it drawn 4 hours or more after your last dose.  If you wish to have your labs drawn at another location, please call the office 24 hours in advance so we can fax the orders.  The office is located at [address], [place], [place], [place] [address]   If you have any questions regarding directions or hours of operation,  please call [phone].   As a reminder, please drink plenty of water prior to coming for your lab work. Thanks!

## 2022-05-24 LAB — COMPLETE METABOLIC PANEL WITH GFR
AG Ratio: 1.4 (calc) (ref 1.0–2.5)
ALT: 14 U/L (ref 6–29)
AST: 16 U/L (ref 10–30)
Albumin: 4.5 g/dL (ref 3.6–5.1)
Alkaline phosphatase (APISO): 36 U/L (ref 31–125)
BUN: 8 mg/dL (ref 7–25)
CO2: 24 mmol/L (ref 20–32)
Calcium: 9.6 mg/dL (ref 8.6–10.2)
Chloride: 106 mmol/L (ref 98–110)
Creat: 0.81 mg/dL (ref 0.50–0.96)
Globulin: 3.2 g/dL (calc) (ref 1.9–3.7)
Glucose, Bld: 86 mg/dL (ref 65–99)
Potassium: 4.2 mmol/L (ref 3.5–5.3)
Sodium: 138 mmol/L (ref 135–146)
Total Bilirubin: 0.4 mg/dL (ref 0.2–1.2)
Total Protein: 7.7 g/dL (ref 6.1–8.1)
eGFR: 103 mL/min/{1.73_m2} (ref >=60)

## 2022-05-24 LAB — PROTEIN / CREATININE RATIO, URINE
Creatinine, Urine: 171 mg/dL (ref 20–275)
Protein/Creat Ratio: 58 mg/g creat (ref 24–184)
Protein/Creatinine Ratio: 0.058 mg/mg creat (ref 0.024–0.184)
Total Protein, Urine: 10 mg/dL (ref 5–24)

## 2022-05-24 LAB — CBC WITH DIFFERENTIAL/PLATELET
Absolute Monocytes: 367 cells/uL (ref 200–950)
Basophils Absolute: 11 cells/uL (ref 0–200)
Basophils Relative: 0.4 %
Eosinophils Absolute: 19 cells/uL (ref 15–500)
Eosinophils Relative: 0.7 %
HCT: 40.6 % (ref 35.0–45.0)
Hemoglobin: 13.4 g/dL (ref 11.7–15.5)
Lymphs Abs: 899 cells/uL (ref 850–3900)
MCH: 30.4 pg (ref 27.0–33.0)
MCHC: 33 g/dL (ref 32.0–36.0)
MCV: 92.1 fL (ref 80.0–100.0)
MPV: 10.4 fL (ref 7.5–12.5)
Monocytes Relative: 13.6 %
Neutro Abs: 1404 cells/uL — ABNORMAL LOW (ref 1500–7800)
Neutrophils Relative %: 52 %
Platelets: 256 10*3/uL (ref 140–400)
RBC: 4.41 10*6/uL (ref 3.80–5.10)
RDW: 12.7 % (ref 11.0–15.0)
Total Lymphocyte: 33.3 %
WBC: 2.7 10*3/uL — ABNORMAL LOW (ref 3.8–10.8)

## 2022-05-24 LAB — SEDIMENTATION RATE: Sed Rate: 9 mm/h (ref 0–20)

## 2022-05-24 LAB — C3 AND C4
C3 Complement: 135 mg/dL (ref 83–193)
C4 Complement: 29 mg/dL (ref 15–57)

## 2022-05-24 LAB — ANTI-DNA ANTIBODY, DOUBLE-STRANDED: ds DNA Ab: 2 IU/mL

## 2022-05-26 NOTE — Progress Notes (Signed)
WBC count is low.  Absolute neutrophils are low. Rest of CBC WNL.  Recheck CBC with diff in 1 month.  CMP WNL. dsDNA is negative.  Complements WNL.  ESR WNL.  Protein creatinine ratio WNL.

## 2022-06-25 ENCOUNTER — Other Ambulatory Visit: Payer: Self-pay | Admitting: Physician Assistant

## 2022-06-25 NOTE — Telephone Encounter (Signed)
Last Fill: 03/26/2022  Labs: 05/23/2022 WBC count is low.  Absolute neutrophils are low. Rest of CBC WNL. Recheck CBC with diff in 1 month. CMP WNL. dsDNA is negative.  Complements WNL. ESR WNL. Protein creatinine ratio WNL.  Next Visit: 10/24/2022  Last Visit: 05/23/2022  DX:  Other systemic lupus erythematosus with other organ involvement   Current Dose per office note 05/23/2022: Methotrexate 4 tablets by mouth once weekly   Okay to refill Methotrexate?

## 2022-07-01 ENCOUNTER — Other Ambulatory Visit: Payer: Self-pay | Admitting: *Deleted

## 2022-07-01 DIAGNOSIS — M3219 Other organ or system involvement in systemic lupus erythematosus: Secondary | ICD-10-CM

## 2022-07-01 DIAGNOSIS — Z79899 Other long term (current) drug therapy: Secondary | ICD-10-CM

## 2022-07-02 LAB — CBC WITH DIFFERENTIAL/PLATELET
Absolute Monocytes: 319 cells/uL (ref 200–950)
Basophils Absolute: 9 cells/uL (ref 0–200)
Basophils Relative: 0.3 %
Eosinophils Absolute: 19 cells/uL (ref 15–500)
Eosinophils Relative: 0.6 %
HCT: 40.4 % (ref 35.0–45.0)
Hemoglobin: 13.4 g/dL (ref 11.7–15.5)
Lymphs Abs: 1174.9 cells/uL (ref 850–3900)
MCH: 31 pg (ref 27.0–33.0)
MCHC: 33.2 g/dL (ref 32.0–36.0)
MCV: 93.5 fL (ref 80.0–100.0)
MPV: 9.5 fL (ref 7.5–12.5)
Monocytes Relative: 10.3 %
Neutro Abs: 1578 cells/uL (ref 1500–7800)
Neutrophils Relative %: 50.9 %
Platelets: 286 10*3/uL (ref 140–400)
RBC: 4.32 10*6/uL (ref 3.80–5.10)
RDW: 12.8 % (ref 11.0–15.0)
Total Lymphocyte: 37.9 %
WBC: 3.1 10*3/uL — ABNORMAL LOW (ref 3.8–10.8)

## 2022-07-02 LAB — COMPLETE METABOLIC PANEL WITH GFR
AG Ratio: 1.3 (calc) (ref 1.0–2.5)
ALT: 15 U/L (ref 6–29)
AST: 16 U/L (ref 10–30)
Albumin: 4.5 g/dL (ref 3.6–5.1)
Alkaline phosphatase (APISO): 35 U/L (ref 31–125)
BUN: 9 mg/dL (ref 7–25)
CO2: 25 mmol/L (ref 20–32)
Calcium: 9.8 mg/dL (ref 8.6–10.2)
Chloride: 105 mmol/L (ref 98–110)
Creat: 0.72 mg/dL (ref 0.50–0.96)
Globulin: 3.6 g/dL (calc) (ref 1.9–3.7)
Glucose, Bld: 84 mg/dL (ref 65–99)
Potassium: 4.2 mmol/L (ref 3.5–5.3)
Sodium: 138 mmol/L (ref 135–146)
Total Bilirubin: 0.5 mg/dL (ref 0.2–1.2)
Total Protein: 8.1 g/dL (ref 6.1–8.1)
eGFR: 118 mL/min/{1.73_m2} (ref >=60)

## 2022-07-02 LAB — PROTEIN / CREATININE RATIO, URINE
Creatinine, Urine: 145 mg/dL (ref 20–275)
Protein/Creat Ratio: 76 mg/g creat (ref 24–184)
Protein/Creatinine Ratio: 0.076 mg/mg creat (ref 0.024–0.184)
Total Protein, Urine: 11 mg/dL (ref 5–24)

## 2022-07-02 LAB — ANTI-DNA ANTIBODY, DOUBLE-STRANDED: ds DNA Ab: 1 IU/mL

## 2022-07-02 LAB — C3 AND C4
C3 Complement: 126 mg/dL (ref 83–193)
C4 Complement: 23 mg/dL (ref 15–57)

## 2022-07-02 LAB — SEDIMENTATION RATE: Sed Rate: 6 mm/h (ref 0–20)

## 2022-07-02 NOTE — Progress Notes (Signed)
WBC count is borderline low but has improved. Rest of CBC WNL. CMP WNL Protein creatinine ratio WNL  ESR WNL

## 2022-07-02 NOTE — Progress Notes (Signed)
Complements WNL

## 2022-07-03 NOTE — Progress Notes (Signed)
dsDNA is negative.  Labs are not consistent with a flare.  No change in therapy recommended at this time.

## 2022-07-16 ENCOUNTER — Other Ambulatory Visit: Payer: Self-pay | Admitting: Rheumatology

## 2022-07-16 DIAGNOSIS — M3219 Other organ or system involvement in systemic lupus erythematosus: Secondary | ICD-10-CM

## 2022-07-16 NOTE — Telephone Encounter (Signed)
Last Fill: 12/31/2021  Eye exam: 11/04/2021 WNL    Labs: 07/01/2022 WBC count is borderline low but has improved. Rest of CBC WNL. CMP WNL Protein creatinine ratio WNL ESR WNL Complements WNL. dsDNA is negative.  Labs are not consistent with a flare.  No change in therapy recommended at this time.   Next Visit: 10/24/2022  Last Visit: 05/23/2022  DX:Other systemic lupus erythematosus with other organ involvement   Current Dose per office note 05/23/2022: Plaquenil 200 mg 1 tablet by mouth BID M-F.   Okay to refill Plaquenil?

## 2022-09-03 NOTE — Telephone Encounter (Signed)
I called the patient to discuss her upcoming trip to the [place] I encouraged the patient to follow-up with her PCP to discuss antianxiety medications specifically for flight anxiety.  I would discourage the use of Tylenol PM unless she is having nocturnal pain.  Discussed that the concurrent use of methotrexate and Tylenol are not recommended due to the concern for hepatotoxicity.  While traveling she was encouraged to wear sunscreen daily along with some protective clothing and a hat.  She was encouraged to continue today Plaquenil methotrexate as prescribed.  Patient was advised to notify us if she needs any refills prior to leaving for her trip.

## 2022-09-10 ENCOUNTER — Other Ambulatory Visit: Payer: Self-pay | Admitting: Physician Assistant

## 2022-09-10 NOTE — Telephone Encounter (Signed)
Last Fill: 06/25/2022  Labs: 07/01/2022 WBC count is borderline low but has improved. Rest of CBC WNL. CMP WNL  Next Visit: 10/24/2022  Last Visit: 05/23/2022  DX: Other systemic lupus erythematosus with other organ involvement   Current Dose per office note 05/23/2022: Methotrexate 4 tablets by mouth once weekly   Okay to refill Methotrexate?

## 2022-09-25 MED ORDER — ONDANSETRON HCL 4 MG PO TABS: 4.0000 mg | ORAL_TABLET | Freq: Four times a day (QID) | ORAL | 0 refills | Status: AC | PRN

## 2022-09-25 NOTE — Telephone Encounter (Signed)
Last Fill: 07/14/2019  Next Visit: 10/24/2022  Last Visit: 05/23/2022  Current Dose per office note on 05/23/2022: not discussed.   Okay to refill zofran?

## 2022-10-10 NOTE — Progress Notes (Signed)
Office Visit Note  Patient: Brittany Preston             Date of Birth: 17-Aug-1995           MRN: [id number]             PCP: Verlon Au, MD Referring: Verlon Au, MD Visit Date: 10/24/2022 Occupation: @GUAROCC @  Subjective:  Medication monitoring  History of Present Illness: Brittany Preston is a 27 y.o. female with history of systemic lupus.  Patient remains on Methotrexate 4 tablets by mouth once weekly, folic acid 1 mg daily, and Plaquenil 200 mg 1 tablet by mouth BID M-F.  She is tolerating combination therapy without any side effects.  She has not missed any doses of these medications recently.  She denies any recent or recurrent infections.  She denies any new medical conditions.  She denies any signs or symptoms of a systemic lupus flare.  She has not had any increased joint pain or joint swelling.  Her energy level has been stable overall.  She recently travel to [place] about 12 days and did not notice any new or worsening symptoms while traveling.  She denies any sores in her mouth or nose.  She denies any worsening sicca symptoms.  She denies any symptoms of Raynaud's phenomenon.  She has not had any recent rashes.  She tries to avoid direct sun exposure.  Activities of Daily Living:  Patient reports morning stiffness for 0 minutes.   Patient Denies nocturnal pain.  Difficulty dressing/grooming: Denies Difficulty climbing stairs: Denies Difficulty getting out of chair: Denies Difficulty using hands for taps, buttons, cutlery, and/or writing: Denies  Review of Systems  Constitutional:  Negative for fatigue.  HENT:  Positive for mouth dryness. Negative for mouth sores.   Eyes:  Negative for dryness.  Respiratory:  Negative for shortness of breath.   Cardiovascular:  Positive for palpitations. Negative for chest pain.  Gastrointestinal:  Negative for blood in stool, constipation and diarrhea.  Endocrine: Negative for increased urination.   Genitourinary:  Negative for involuntary urination.  Musculoskeletal:  Positive for joint pain and joint pain. Negative for gait problem, joint swelling, myalgias, muscle weakness, morning stiffness, muscle tenderness and myalgias.  Skin:  Positive for color change and sensitivity to sunlight. Negative for rash and hair loss.  Allergic/Immunologic: Negative for susceptible to infections.  Neurological:  Negative for dizziness and headaches.  Hematological:  Negative for swollen glands.  Psychiatric/Behavioral:  Negative for depressed mood and sleep disturbance. The patient is not nervous/anxious.     PMFS History:  Patient Active Problem List   Diagnosis Date Noted   Systemic lupus erythematosus (HCC) 11/10/2016   Vitamin D deficiency 11/06/2016   Hair loss 10/14/2016   Leukopenia 10/14/2016   High risk medication use 10/14/2016   Moderate persistent asthma without complication 12/10/2015    Past Medical History:  Diagnosis Date   Asthma    Lupus (HCC) 06/2016   Systemic lupus erythematosus (HCC)     Family History  Problem Relation Age of Onset   Lupus Sister    Kidney disease Sister    Hypertension Sister    History reviewed. No pertinent surgical history. Social History   Social History Narrative   Not on file   Immunization History  Administered Date(s) Administered   DTaP 03/04/1996, 06/27/1996, 10/07/1996, 07/07/1997, 01/03/2000   HIB (PRP-OMP) 03/04/1996, 06/27/1996, 10/07/1996, 07/07/1997   HIB (PRP-T) 03/04/1996, 06/27/1996, 10/07/1996, 07/07/1997   HPV Quadrivalent 12/27/2008,  03/09/2009, 06/15/2009   Hepatitis A 06/18/2005, 10/20/2006   Hepatitis A, Ped/Adol-2 Dose 06/18/2005, 10/20/2006   Hepatitis B 01/13/1996, 02/24/1996, 10/07/1996   Hepatitis B, PED/ADOLESCENT 01/13/1996, 02/24/1996, 10/07/1996   IPV 03/04/1996, 06/27/1996, 10/07/1996, 01/03/2000   Influenza,inj,Quad PF,6+ Mos 12/10/2015   Influenza-Unspecified 12/10/2015, 01/07/2017   MMR  12/30/1996, 01/03/2000   Meningococcal Conjugate 12/27/2007, 03/29/2013   PFIZER(Purple Top)SARS-COV-2 Vaccination 06/10/2019, 07/01/2019, 01/23/2020   Pneumococcal Conjugate-13 01/03/2000   Pneumococcal Polysaccharide-23 08/04/2016   Tdap 10/20/2006   Unspecified SARS-COV-2 Vaccination 06/10/2019, 07/01/2019, 01/23/2020   Varicella 12/30/1996, 06/18/2005     Objective: Vital Signs: BP 111/74 (BP Location: Left Arm, Patient Position: Sitting, Cuff Size: Normal)   Pulse 90   Resp 14   Ht 5\' 2"  (1.575 m)   Wt 163 lb 9.6 oz (74.2 kg)   BMI 29.92 kg/m    Physical Exam Vitals and nursing note reviewed.  Constitutional:      Appearance: She is well-developed.  HENT:     Head: Normocephalic and atraumatic.  Eyes:     Conjunctiva/sclera: Conjunctivae normal.  Cardiovascular:     Rate and Rhythm: Normal rate and regular rhythm.     Heart sounds: Normal heart sounds.  Pulmonary:     Effort: Pulmonary effort is normal.     Breath sounds: Normal breath sounds.  Abdominal:     General: Bowel sounds are normal.     Palpations: Abdomen is soft.  Musculoskeletal:     Cervical back: Normal range of motion.  Lymphadenopathy:     Cervical: No cervical adenopathy.  Skin:    General: Skin is warm and dry.     Capillary Refill: Capillary refill takes less than 2 seconds.  Neurological:     Mental Status: She is alert and oriented to person, place, and time.  Psychiatric:        Behavior: Behavior normal.      Musculoskeletal Exam: C-spine, thoracic spine, lumbar spine have good range of motion.  Shoulder joints, elbow joints, wrist joints, MCPs, PIPs, DIPs have good range of motion with no synovitis.  Complete fist formation bilaterally.  Hip joints have good range of motion with no groin pain.  Knee joints have good range of motion with no warmth or effusion.  Ankle joints have good range of motion with no tenderness or joint swelling.  CDAI Exam: CDAI Score: -- Patient Global: --;  Provider Global: -- Swollen: --; Tender: -- Joint Exam 10/24/2022   No joint exam has been documented for this visit   There is currently no information documented on the homunculus. Go to the Rheumatology activity and complete the homunculus joint exam.  Investigation: No additional findings.  Imaging: No results found.  Recent Labs: Lab Results  Component Value Date   WBC 3.1 (L) 07/01/2022   HGB 13.4 07/01/2022   PLT 286 07/01/2022   NA 138 07/01/2022   K 4.2 07/01/2022   CL 105 07/01/2022   CO2 25 07/01/2022   GLUCOSE 84 07/01/2022   BUN 9 07/01/2022   CREATININE 0.72 07/01/2022   BILITOT 0.5 07/01/2022   ALKPHOS 34 11/13/2016   AST 16 07/01/2022   ALT 15 07/01/2022   PROT 8.1 07/01/2022   ALBUMIN 4.3 11/13/2016   CALCIUM 9.8 07/01/2022   GFRAA 121 07/12/2020    Speciality Comments: PLQ Eye Exam: 11/04/2021 WNL @ [facility] follow up in 1 year  Procedures:  No procedures performed Allergies: Patient has no known allergies.  Assessment / Plan:     Visit Diagnoses: Other systemic lupus erythematosus with other organ involvement (HCC) - History of fatigue, weight loss, hair loss, malar rash, Raynauds, skin lupus, neutropenia, elevated sedimentation rate, positive ANA,ds DNA, Sm, Ro, low C3: She has not had any signs or symptoms of a systemic lupus flare.  She has clinically been doing well taking methotrexate 4 tablets by mouth once weekly, folic acid 1 mg daily, Plaquenil 200 mg 1 tablet by mouth twice daily Monday through Friday.  She is tolerating combination therapy without any side effects and has not missed any doses recently.  She has no synovitis on examination today.  Her energy level has been stable.  No weight change.  No recent rashes or hair loss.  No recent symptoms of Raynaud's phenomenon.  Good capillary refill noted on examination today.  No digital ulcerations noted.  She has not had any oral or nasal ulcerations. Lab work from 07/01/22 was  reviewed today in the office: dsDNA negative, complements WNL, ESR WNL, and protein creatinine ratio WNL.  The following lab work will be updated today. She will remain on combination therapy as prescribed.  A refill of folic acid was sent to the pharmacy today.  She was advised to notify us if she develops any new or worsening symptoms.  She will follow up in 3 months or sooner if needed.   - Plan: Protein / creatinine ratio, urine, CBC with Differential/Platelet, COMPLETE METABOLIC PANEL WITH GFR, C3 and C4, Anti-DNA antibody, double-stranded, Sedimentation rate  High risk medication use - Methotrexate 4 tablets by mouth once weekly, folic acid 1 mg daily, and Plaquenil 200 mg 1 tablet by mouth BID M-F.  CBC and CMP updated on 07/01/22. Orders for CBC and CMP released today.  Her next lab work will be due in October and every 3 months to monitor for drug toxicity. PLQ Eye Exam: 11/04/2021 WNL @ [facility] follow up in 1 year.  Patient was given a Plaquenil eye examination form to take with her to her upcoming appointment. - Plan: CBC with Differential/Platelet, COMPLETE METABOLIC PANEL WITH GFR  Raynaud's syndrome without gangrene: Not currently symptomatic.  No digital ulcerations or signs of gangrene noted.  Good capillary refill noted on examination today.  Knee joint stiffness, bilateral: She has good range of motion of both knee joints on examination today.  No warmth or effusion noted.  She recently travel to the [place] and was walking more than usual but did not notice signs or symptoms of a flare.  Other decreased white blood cell (WBC) count: White blood cell count was slightly low at 3.1.  Absolute neutrophils returned to within normal limits.  Rest of CBC within normal limits on 07/01/22. CBC with diff updated today.   Other medical conditions are listed as follows:  History of asthma  Other insomnia  Anxiety  Vitamin D deficiency: She is taking vitamin D 2000 units  daily.  Family history of lupus erythematosus    Orders: Orders Placed This Encounter  Procedures   Protein / creatinine ratio, urine   CBC with Differential/Platelet   COMPLETE METABOLIC PANEL WITH GFR   C3 and C4   Anti-DNA antibody, double-stranded   Sedimentation rate   Meds ordered this encounter  Medications   folic acid (FOLVITE) 1 MG tablet    Sig: Take 1 tablet (1 mg total) by mouth daily.    Dispense:  90 tablet    Refill:  3  Requesting 1 year supply     Follow-Up Instructions: Return in about 3 months (around 01/24/2023) for Systemic lupus erythematosus.   Gearldine Bienenstock, PA-C  Note - This record has been created using Dragon software.  Chart creation errors have been sought, but may not always  have been located. Such creation errors do not reflect on  the standard of medical care.

## 2022-10-15 ENCOUNTER — Other Ambulatory Visit: Payer: Self-pay | Admitting: Physician Assistant

## 2022-10-15 DIAGNOSIS — M3219 Other organ or system involvement in systemic lupus erythematosus: Secondary | ICD-10-CM

## 2022-10-16 NOTE — Telephone Encounter (Signed)
Last Fill: 07/16/2022  Eye exam: 11/04/2021 WNL   Labs: 07/01/2022 WBC count is borderline low but has improved. Rest of CBC WNL. CMP WNL  Next Visit: 10/24/2022  Last Visit: 05/23/2022  DX:Other systemic lupus erythematosus with other organ involvement   Current Dose per office note 05/23/2022: Plaquenil 200 mg 1 tablet by mouth BID M-F.   Okay to refill Plaquenil?

## 2022-10-24 ENCOUNTER — Encounter: Payer: Self-pay | Admitting: Physician Assistant

## 2022-10-24 ENCOUNTER — Ambulatory Visit: Payer: 59 | Attending: Physician Assistant | Admitting: Physician Assistant

## 2022-10-24 VITALS — BP 111/74 | HR 90 | Resp 14 | Ht 62.0 in | Wt 163.6 lb

## 2022-10-24 DIAGNOSIS — Z79899 Other long term (current) drug therapy: Secondary | ICD-10-CM

## 2022-10-24 DIAGNOSIS — M25661 Stiffness of right knee, not elsewhere classified: Secondary | ICD-10-CM | POA: Diagnosis not present

## 2022-10-24 DIAGNOSIS — G4709 Other insomnia: Secondary | ICD-10-CM

## 2022-10-24 DIAGNOSIS — F419 Anxiety disorder, unspecified: Secondary | ICD-10-CM

## 2022-10-24 DIAGNOSIS — M3219 Other organ or system involvement in systemic lupus erythematosus: Secondary | ICD-10-CM

## 2022-10-24 DIAGNOSIS — Z6831 Body mass index (BMI) 31.0-31.9, adult: Secondary | ICD-10-CM

## 2022-10-24 DIAGNOSIS — Z8709 Personal history of other diseases of the respiratory system: Secondary | ICD-10-CM

## 2022-10-24 DIAGNOSIS — M25662 Stiffness of left knee, not elsewhere classified: Secondary | ICD-10-CM

## 2022-10-24 DIAGNOSIS — Z840 Family history of diseases of the skin and subcutaneous tissue: Secondary | ICD-10-CM

## 2022-10-24 DIAGNOSIS — I7300 Raynaud's syndrome without gangrene: Secondary | ICD-10-CM

## 2022-10-24 DIAGNOSIS — D72818 Other decreased white blood cell count: Secondary | ICD-10-CM

## 2022-10-24 DIAGNOSIS — E559 Vitamin D deficiency, unspecified: Secondary | ICD-10-CM

## 2022-10-24 LAB — CBC WITH DIFFERENTIAL/PLATELET
Absolute Monocytes: 426 cells/uL (ref 200–950)
Basophils Absolute: 11 cells/uL (ref 0–200)
Basophils Relative: 0.3 %
Eosinophils Absolute: 19 cells/uL (ref 15–500)
Eosinophils Relative: 0.5 %
HCT: 40.6 % (ref 35.0–45.0)
Hemoglobin: 13.3 g/dL (ref 11.7–15.5)
Lymphs Abs: 992 cells/uL (ref 850–3900)
MCH: 30.9 pg (ref 27.0–33.0)
MCHC: 32.8 g/dL (ref 32.0–36.0)
MCV: 94.4 fL (ref 80.0–100.0)
MPV: 10.1 fL (ref 7.5–12.5)
Monocytes Relative: 11.2 %
Neutrophils Relative %: 61.9 %
Platelets: 259 10*3/uL (ref 140–400)
RBC: 4.3 10*6/uL (ref 3.80–5.10)
RDW: 12.7 % (ref 11.0–15.0)
Total Lymphocyte: 26.1 %
WBC: 3.8 10*3/uL (ref 3.8–10.8)

## 2022-10-24 LAB — C3 AND C4
C3 Complement: 130 mg/dL (ref 83–193)
C4 Complement: 24 mg/dL (ref 15–57)

## 2022-10-24 LAB — SEDIMENTATION RATE: Sed Rate: 6 mm/h (ref 0–20)

## 2022-10-24 MED ORDER — FOLIC ACID 1 MG PO TABS: 1.0000 mg | ORAL_TABLET | Freq: Every day | ORAL | 3 refills | Status: DC

## 2022-10-25 LAB — CBC WITH DIFFERENTIAL/PLATELET: Neutro Abs: 2352 cells/uL (ref 1500–7800)

## 2022-10-26 NOTE — Progress Notes (Signed)
CBC and CMP WNL Protein creatinine ratio WNL  ESR WNL  Complements WNL  dsDNA is negative.  Labs are not consistent with a systemic lupus flare

## 2022-11-25 NOTE — Telephone Encounter (Signed)
I would recommend an in-person evaluation with PCP since gas-x is not providing relief.

## 2022-12-17 ENCOUNTER — Other Ambulatory Visit: Payer: Self-pay | Admitting: Physician Assistant

## 2022-12-18 NOTE — Telephone Encounter (Signed)
Last Fill: 09/10/2022  Labs: 10/24/2022 CBC and CMP WNL   Next Visit: 01/30/2023  Last Visit: 10/24/2022  DX:  Other systemic lupus erythematosus with other organ involvemen   Current Dose per office note 10/24/2022: Methotrexate 4 tablets by mouth once weekly   Okay to refill Methotrexate?

## 2023-01-16 NOTE — Progress Notes (Deleted)
Office Visit Note  Patient: Brittany Preston             Date of Birth: Jun 02, 1995           MRN: [id number]             PCP: Verlon Au, MD Referring: Verlon Au, MD Visit Date: 01/30/2023 Occupation: @GUAROCC @  Subjective:  No chief complaint on file.   History of Present Illness: Brittany Preston is a 27 y.o. female ***     Activities of Daily Living:  Patient reports morning stiffness for *** {minute/hour:19697}.   Patient {ACTIONS;DENIES/REPORTS:21021675::"Denies"} nocturnal pain.  Difficulty dressing/grooming: {ACTIONS;DENIES/REPORTS:21021675::"Denies"} Difficulty climbing stairs: {ACTIONS;DENIES/REPORTS:21021675::"Denies"} Difficulty getting out of chair: {ACTIONS;DENIES/REPORTS:21021675::"Denies"} Difficulty using hands for taps, buttons, cutlery, and/or writing: {ACTIONS;DENIES/REPORTS:21021675::"Denies"}  No Rheumatology ROS completed.   PMFS History:  Patient Active Problem List   Diagnosis Date Noted   Systemic lupus erythematosus (HCC) 11/10/2016   Vitamin D deficiency 11/06/2016   Hair loss 10/14/2016   Leukopenia 10/14/2016   High risk medication use 10/14/2016   Moderate persistent asthma without complication 12/10/2015    Past Medical History:  Diagnosis Date   Asthma    Lupus (HCC) 06/2016   Systemic lupus erythematosus (HCC)     Family History  Problem Relation Age of Onset   Lupus Sister    Kidney disease Sister    Hypertension Sister    No past surgical history on file. Social History   Social History Narrative   Not on file   Immunization History  Administered Date(s) Administered   DTaP 03/04/1996, 06/27/1996, 10/07/1996, 07/07/1997, 01/03/2000   HIB (PRP-OMP) 03/04/1996, 06/27/1996, 10/07/1996, 07/07/1997   HIB (PRP-T) 03/04/1996, 06/27/1996, 10/07/1996, 07/07/1997   HPV Quadrivalent 12/27/2008, 03/09/2009, 06/15/2009   Hepatitis A 06/18/2005, 10/20/2006   Hepatitis A, Ped/Adol-2 Dose 06/18/2005, 10/20/2006    Hepatitis B 01/13/1996, 02/24/1996, 10/07/1996   Hepatitis B, PED/ADOLESCENT 01/13/1996, 02/24/1996, 10/07/1996   IPV 03/04/1996, 06/27/1996, 10/07/1996, 01/03/2000   Influenza,inj,Quad PF,6+ Mos 12/10/2015   Influenza-Unspecified 12/10/2015, 01/07/2017   MMR 12/30/1996, 01/03/2000   Meningococcal Conjugate 12/27/2007, 03/29/2013   PFIZER(Purple Top)SARS-COV-2 Vaccination 06/10/2019, 07/01/2019, 01/23/2020   Pneumococcal Conjugate-13 01/03/2000   Pneumococcal Polysaccharide-23 08/04/2016   Tdap 10/20/2006   Varicella 12/30/1996, 06/18/2005     Objective: Vital Signs: There were no vitals taken for this visit.   Physical Exam   Musculoskeletal Exam: ***  CDAI Exam: CDAI Score: -- Patient Global: --; Provider Global: -- Swollen: --; Tender: -- Joint Exam 01/30/2023   No joint exam has been documented for this visit   There is currently no information documented on the homunculus. Go to the Rheumatology activity and complete the homunculus joint exam.  Investigation: No additional findings.  Imaging: No results found.  Recent Labs: Lab Results  Component Value Date   WBC 3.8 10/24/2022   HGB 13.3 10/24/2022   PLT 259 10/24/2022   NA 139 10/24/2022   K 4.3 10/24/2022   CL 105 10/24/2022   CO2 25 10/24/2022   GLUCOSE 90 10/24/2022   BUN 10 10/24/2022   CREATININE 0.84 10/24/2022   BILITOT 0.3 10/24/2022   ALKPHOS 34 11/13/2016   AST 14 10/24/2022   ALT 12 10/24/2022   PROT 7.7 10/24/2022   ALBUMIN 4.3 11/13/2016   CALCIUM 9.4 10/24/2022   GFRAA 121 07/12/2020    Speciality Comments: PLQ Eye Exam: 11/04/2021 WNL @ [facility] follow up in 1 year  Procedures:  No procedures performed Allergies: Patient has no  known allergies.   Assessment / Plan:     Visit Diagnoses: No diagnosis found.  Orders: No orders of the defined types were placed in this encounter.  No orders of the defined types were placed in this encounter.   Face-to-face time spent with  patient was *** minutes. Greater than 50% of time was spent in counseling and coordination of care.  Follow-Up Instructions: No follow-ups on file.   Ellen Henri, CMA  Note - This record has been created using Animal nutritionist.  Chart creation errors have been sought, but may not always  have been located. Such creation errors do not reflect on  the standard of medical care.

## 2023-01-30 ENCOUNTER — Ambulatory Visit: Payer: 59 | Admitting: Physician Assistant

## 2023-01-30 DIAGNOSIS — M25661 Stiffness of right knee, not elsewhere classified: Secondary | ICD-10-CM

## 2023-01-30 DIAGNOSIS — Z79899 Other long term (current) drug therapy: Secondary | ICD-10-CM

## 2023-01-30 DIAGNOSIS — F419 Anxiety disorder, unspecified: Secondary | ICD-10-CM

## 2023-01-30 DIAGNOSIS — M3219 Other organ or system involvement in systemic lupus erythematosus: Secondary | ICD-10-CM

## 2023-01-30 DIAGNOSIS — I7300 Raynaud's syndrome without gangrene: Secondary | ICD-10-CM

## 2023-01-30 DIAGNOSIS — E559 Vitamin D deficiency, unspecified: Secondary | ICD-10-CM

## 2023-01-30 DIAGNOSIS — Z8709 Personal history of other diseases of the respiratory system: Secondary | ICD-10-CM

## 2023-01-30 DIAGNOSIS — D72818 Other decreased white blood cell count: Secondary | ICD-10-CM

## 2023-01-30 DIAGNOSIS — Z840 Family history of diseases of the skin and subcutaneous tissue: Secondary | ICD-10-CM

## 2023-01-30 DIAGNOSIS — G4709 Other insomnia: Secondary | ICD-10-CM

## 2023-02-02 ENCOUNTER — Other Ambulatory Visit: Payer: Self-pay | Admitting: *Deleted

## 2023-02-02 DIAGNOSIS — M3219 Other organ or system involvement in systemic lupus erythematosus: Secondary | ICD-10-CM

## 2023-02-02 MED ORDER — HYDROXYCHLOROQUINE SULFATE 200 MG PO TABS
ORAL_TABLET | ORAL | 0 refills | Status: DC
Start: 2023-02-02 — End: 2023-02-20

## 2023-02-02 NOTE — Telephone Encounter (Signed)
Last Fill: 10/16/2022  Eye exam: 11/04/2021 WNL   Labs: 10/24/2022 CBC and CMP WNL   Next Visit: 02/20/2023  Last Visit: 10/24/2022  DX:Other systemic lupus erythematosus with other organ involvement   Current Dose per office note 10/24/2022: Plaquenil 200 mg 1 tablet by mouth BID M-F.   Patient has her PLQ eye exam scheduled for 02/06/2023.   Okay to refill Plaquenil?

## 2023-02-02 NOTE — Telephone Encounter (Signed)
Patient contacted the office to request a medication refill.   1. Name of Medication: Plaquenil  2. How are you currently taking this medication (dosage and times per day)? 200 mg po BID M-F   3. What pharmacy would you like for that to be sent to? [facility] [employer] [facility]

## 2023-02-06 NOTE — Progress Notes (Signed)
Office Visit Note  Patient: Brittany Preston             Date of Birth: Aug 15, 1995           MRN: [id number]             PCP: Verlon Au, MD Referring: Verlon Au, MD Visit Date: 02/20/2023 Occupation: @GUAROCC @  Subjective:  Constipation   History of Present Illness: Brittany Preston is a 27 y.o. female with history of systemic lupus.  Patient remains on Methotrexate 4 tablets by mouth once weekly, folic acid 1 mg daily, and Plaquenil 200 mg 1 tablet by mouth BID M-F.  She is tolerating combination therapy without any side effects.  She denies any signs or symptoms of a systemic lupus flare.  Her energy level has been stable.  She denies any joint swelling.  She denies any oral or nasal ulcerations.  She has not had any recent rashes or hair loss.  She has intermittent symptoms of raynaud's phenomenon.   Patient states for the past few months she has been experiencing constipation intermittently.  She has been unable to identify a trigger and has not had any significant dietary changes or medication changes.  This week she has tried eating more clean foods and was considering the use of a laxative.  She denies any nausea, vomiting, blood in her stool, or diarrhea.  Patient states that she has also had increased gas.    Activities of Daily Living:  Patient reports morning stiffness for 5-10 minutes.   Patient Denies nocturnal pain.  Difficulty dressing/grooming: Denies Difficulty climbing stairs: Denies Difficulty getting out of chair: Denies Difficulty using hands for taps, buttons, cutlery, and/or writing: Denies  Review of Systems  Constitutional:  Negative for fatigue.  HENT:  Negative for mouth sores and mouth dryness.   Eyes:  Negative for pain, visual disturbance and dryness.  Respiratory:  Negative for cough, shortness of breath and wheezing.   Cardiovascular:  Positive for palpitations. Negative for chest pain.  Gastrointestinal:  Positive for constipation.  Negative for blood in stool and diarrhea.  Endocrine: Negative for increased urination.  Genitourinary:  Negative for involuntary urination.  Musculoskeletal:  Positive for morning stiffness. Negative for joint pain, gait problem, joint pain, joint swelling, myalgias, muscle weakness, muscle tenderness and myalgias.  Skin:  Positive for color change. Negative for rash, hair loss and sensitivity to sunlight.  Allergic/Immunologic: Negative for susceptible to infections.  Neurological:  Negative for dizziness, numbness and headaches.  Hematological:  Negative for swollen glands.  Psychiatric/Behavioral:  Negative for depressed mood and sleep disturbance. The patient is not nervous/anxious.     PMFS History:  Patient Active Problem List   Diagnosis Date Noted   Systemic lupus erythematosus (HCC) 11/10/2016   Vitamin D deficiency 11/06/2016   Hair loss 10/14/2016   Leukopenia 10/14/2016   High risk medication use 10/14/2016   Moderate persistent asthma without complication 12/10/2015    Past Medical History:  Diagnosis Date   Asthma    Lupus 06/2016   Systemic lupus erythematosus (HCC)     Family History  Problem Relation Age of Onset   Lupus Sister    Kidney disease Sister    Hypertension Sister    History reviewed. No pertinent surgical history. Social History   Social History Narrative   Not on file   Immunization History  Administered Date(s) Administered   DTaP 03/04/1996, 06/27/1996, 10/07/1996, 07/07/1997, 01/03/2000   HIB (PRP-OMP) 03/04/1996, 06/27/1996, 10/07/1996,  07/07/1997   HIB (PRP-T) 03/04/1996, 06/27/1996, 10/07/1996, 07/07/1997   HPV Quadrivalent 12/27/2008, 03/09/2009, 06/15/2009   Hepatitis A 06/18/2005, 10/20/2006   Hepatitis A, Ped/Adol-2 Dose 06/18/2005, 10/20/2006   Hepatitis B 01/13/1996, 02/24/1996, 10/07/1996   Hepatitis B, PED/ADOLESCENT 01/13/1996, 02/24/1996, 10/07/1996   IPV 03/04/1996, 06/27/1996, 10/07/1996, 01/03/2000    Influenza,inj,Quad PF,6+ Mos 12/10/2015   Influenza-Unspecified 12/10/2015, 01/07/2017   MMR 12/30/1996, 01/03/2000   Meningococcal Conjugate 12/27/2007, 03/29/2013   PFIZER(Purple Top)SARS-COV-2 Vaccination 06/10/2019, 07/01/2019, 01/23/2020   Pneumococcal Conjugate-13 01/03/2000   Pneumococcal Polysaccharide-23 08/04/2016   Tdap 10/20/2006   Varicella 12/30/1996, 06/18/2005     Objective: Vital Signs: BP 114/80 (BP Location: Left Arm, Patient Position: Sitting, Cuff Size: Normal)   Pulse 80   Resp 15   Ht 5\' 2"  (1.575 m)   Wt 160 lb (72.6 kg)   BMI 29.26 kg/m    Physical Exam Vitals and nursing note reviewed.  Constitutional:      Appearance: She is well-developed.  HENT:     Head: Normocephalic and atraumatic.  Eyes:     Conjunctiva/sclera: Conjunctivae normal.  Cardiovascular:     Rate and Rhythm: Normal rate and regular rhythm.     Heart sounds: Normal heart sounds.  Pulmonary:     Effort: Pulmonary effort is normal.     Breath sounds: Normal breath sounds.  Abdominal:     General: Bowel sounds are normal.     Palpations: Abdomen is soft.  Musculoskeletal:     Cervical back: Normal range of motion.  Lymphadenopathy:     Cervical: No cervical adenopathy.  Skin:    General: Skin is warm and dry.     Capillary Refill: Capillary refill takes less than 2 seconds.  Neurological:     Mental Status: She is alert and oriented to person, place, and time.  Psychiatric:        Behavior: Behavior normal.      Musculoskeletal Exam: C-spine, thoracic spine, lumbar spine good range of motion.  Shoulder joints, elbow joints, wrist joints, MCPs, PIPs, DIPs have good range of motion with no synovitis.  Complete fist formation bilaterally.  Hip joints have good range of motion with no groin pain.  Knee joints have good range of motion no warmth or effusion.  Ankle joints have good range of motion with no tenderness or joint swelling.  CDAI Exam: CDAI Score: -- Patient Global:  --; Provider Global: -- Swollen: --; Tender: -- Joint Exam 02/20/2023   No joint exam has been documented for this visit   There is currently no information documented on the homunculus. Go to the Rheumatology activity and complete the homunculus joint exam.  Investigation: No additional findings.  Imaging: No results found.  Recent Labs: Lab Results  Component Value Date   WBC 3.8 10/24/2022   HGB 13.3 10/24/2022   PLT 259 10/24/2022   NA 139 10/24/2022   K 4.3 10/24/2022   CL 105 10/24/2022   CO2 25 10/24/2022   GLUCOSE 90 10/24/2022   BUN 10 10/24/2022   CREATININE 0.84 10/24/2022   BILITOT 0.3 10/24/2022   ALKPHOS 34 11/13/2016   AST 14 10/24/2022   ALT 12 10/24/2022   PROT 7.7 10/24/2022   ALBUMIN 4.3 11/13/2016   CALCIUM 9.4 10/24/2022   GFRAA 121 07/12/2020    Speciality Comments: PLQ Eye Exam: 02/06/2023 WNL @ [facility] follow up in 1 year   Procedures:  No procedures performed Allergies: Patient has no known allergies.  Assessment / Plan:     Visit Diagnoses: Other systemic lupus erythematosus with other organ involvement (HCC) - fatigue, weight loss, hair loss, malar rash, Raynauds, skin lupus, neutropenia, elevated sedimentation rate, positive ANA,ds DNA, Sm, Ro, low C3: She has not had any signs or symptoms of a systemic lupus flare.  She has clinically been doing well taking methotrexate 4 tablets by mouth once weekly, folic acid 1 mg daily, and Plaquenil 200 mg 1 tablet by mouth twice daily Monday through Friday.  She is tolerating combination therapy without any side effects and has not had any gaps in therapy.  No Malar rash noted.  No cervical lymphadenopathy noted.  She has not had any hair loss.  Her energy level has been stable.  No synovitis was noted on examination today.  She experiences intermittent symptoms of Raynaud's phenomenon but no digital ulcerations were noted on examination today. Lab work from 10/24/22 was reviewed today in the  office: ESR WNL, complements WNL, dsDNA negative, urine protein creatinine ratio WNL, CBC WNL, and CMP WNL.  The following lab work will be updated today.  She will remain on methotrexate, folic acid, Plaquenil as prescribed.  Refills of Plaquenil and methotrexate will be sent to the pharmacy today.  She was advised to notify us if she develops signs or symptoms of a flare.  She will follow-up in the office in 3 months or sooner if needed.  - Plan: COMPLETE METABOLIC PANEL WITH GFR, CBC with Differential/Platelet, Protein / creatinine ratio, urine, Anti-DNA antibody, double-stranded, C3 and C4, Sedimentation rate, hydroxychloroquine (PLAQUENIL) 200 MG tablet  High risk medication use - Methotrexate 4 tablets by mouth once weekly, folic acid 1 mg daily, and Plaquenil 200 mg 1 tablet by mouth BID M-F.  CBC and CMP updated on 10/24/22.  Orders for CBC and CMP were released today. PLQ Eye Exam: 02/06/2023 WNL @ [facility] follow up in 1 year  No recent or recurrent infections. Patient received annual flu shot. - Plan: COMPLETE METABOLIC PANEL WITH GFR, CBC with Differential/Platelet  Raynaud's syndrome without gangrene: She experiences intermittent symptoms of Raynaud's phenomenon.  Capillary refill 2 to 3 seconds today.  No signs of sclerodactyly.  No digital ulcerations noted.  Discussed the importance of keeping her core body temperature warm as well as wearing gloves and socks especially through the winter months.  She will notify us if she develops any new or worsening symptoms.  Knee joint stiffness, bilateral: Improved.  Good ROM of both knee joints.  No warmth or effusion noted.   Other decreased white blood cell (WBC) count: WBC count WNL-3.8 on 726/24.  CBC with diff updated today.   Other constipation: Patient has been experiencing constipation intermittently over the past few months.  She has not had any medication changes or significant dietary changes.  She has not noticed any blood in  her stool, nausea, vomiting, or diarrhea.  She has had some increased bloating and gas. She takes a multivitamin with iron.  Discussed that iron can cause constipation.  She has not been taking Zofran consistently but discussed that a possible side effect of Zofran is constipation.  Encouraged patient to increase her water intake.  She was also encouraged to increase her fiber intake with the use of Metamucil or Benefiber.  She can also introduce a probiotic such as align she was vies notify us if her symptoms persist or worsen.  Referral to GI can be placed if needed.  Other medical conditions are  listed as follows:   History of asthma  Other insomnia  Anxiety  Vitamin D deficiency: She is taking vitamin D 2000 units daily.   Family history of lupus erythematosus     Orders: Orders Placed This Encounter  Procedures   COMPLETE METABOLIC PANEL WITH GFR   CBC with Differential/Platelet   Protein / creatinine ratio, urine   Anti-DNA antibody, double-stranded   C3 and C4   Sedimentation rate   Meds ordered this encounter  Medications   hydroxychloroquine (PLAQUENIL) 200 MG tablet    Sig: TAKE 1 TABLET BY MOUTH TWICE DAILY( EVERY TWELVE HOURS) MONDAY THROUGH FRIDAY. DO NOT TAKE ON SATURDAY OR SUNDAY.    Dispense:  120 tablet    Refill:  0   methotrexate (RHEUMATREX) 2.5 MG tablet    Sig: Take 4 tablets (10 mg total) by mouth once a week. Caution:Chemotherapy. Protect from light.    Dispense:  48 tablet    Refill:  0    Follow-Up Instructions: Return in about 3 months (around 05/23/2023) for Systemic lupus erythematosus.   Gearldine Bienenstock, PA-C  Note - This record has been created using Dragon software.  Chart creation errors have been sought, but may not always  have been located. Such creation errors do not reflect on  the standard of medical care.

## 2023-02-20 ENCOUNTER — Encounter: Payer: Self-pay | Admitting: Physician Assistant

## 2023-02-20 ENCOUNTER — Ambulatory Visit: Payer: 59 | Attending: Physician Assistant | Admitting: Physician Assistant

## 2023-02-20 VITALS — BP 114/80 | HR 80 | Resp 15 | Ht 62.0 in | Wt 160.0 lb

## 2023-02-20 DIAGNOSIS — E559 Vitamin D deficiency, unspecified: Secondary | ICD-10-CM

## 2023-02-20 DIAGNOSIS — G4709 Other insomnia: Secondary | ICD-10-CM

## 2023-02-20 DIAGNOSIS — M3219 Other organ or system involvement in systemic lupus erythematosus: Secondary | ICD-10-CM | POA: Diagnosis not present

## 2023-02-20 DIAGNOSIS — M25661 Stiffness of right knee, not elsewhere classified: Secondary | ICD-10-CM | POA: Diagnosis not present

## 2023-02-20 DIAGNOSIS — Z79899 Other long term (current) drug therapy: Secondary | ICD-10-CM

## 2023-02-20 DIAGNOSIS — M25662 Stiffness of left knee, not elsewhere classified: Secondary | ICD-10-CM

## 2023-02-20 DIAGNOSIS — Z8709 Personal history of other diseases of the respiratory system: Secondary | ICD-10-CM

## 2023-02-20 DIAGNOSIS — D72818 Other decreased white blood cell count: Secondary | ICD-10-CM

## 2023-02-20 DIAGNOSIS — I7300 Raynaud's syndrome without gangrene: Secondary | ICD-10-CM | POA: Diagnosis not present

## 2023-02-20 DIAGNOSIS — F419 Anxiety disorder, unspecified: Secondary | ICD-10-CM

## 2023-02-20 DIAGNOSIS — Z840 Family history of diseases of the skin and subcutaneous tissue: Secondary | ICD-10-CM

## 2023-02-20 DIAGNOSIS — K5909 Other constipation: Secondary | ICD-10-CM

## 2023-02-20 MED ORDER — HYDROXYCHLOROQUINE SULFATE 200 MG PO TABS: ORAL_TABLET | ORAL | 0 refills | Status: DC

## 2023-02-20 MED ORDER — METHOTREXATE SODIUM 2.5 MG PO TABS: 10.0000 mg | ORAL_TABLET | ORAL | 0 refills | Status: DC

## 2023-02-20 NOTE — Telephone Encounter (Signed)
Noted in patient's chart.

## 2023-02-20 NOTE — Patient Instructions (Addendum)
Metamucil (increase fiber intake)  Probiotic--Align   Plant based diet/mediterranean style diet  Increased water intake  Avoid iron supplement for now.  Reduce dairy intake.   Zofran can cause constipation

## 2023-02-21 LAB — COMPLETE METABOLIC PANEL WITH GFR
AG Ratio: 1.2 (calc) (ref 1.0–2.5)
ALT: 19 U/L (ref 6–29)
AST: 18 U/L (ref 10–30)
Albumin: 4.5 g/dL (ref 3.6–5.1)
Alkaline phosphatase (APISO): 40 U/L (ref 31–125)
BUN: 10 mg/dL (ref 7–25)
CO2: 25 mmol/L (ref 20–32)
Calcium: 9.9 mg/dL (ref 8.6–10.2)
Chloride: 106 mmol/L (ref 98–110)
Creat: 0.75 mg/dL (ref 0.50–0.96)
Globulin: 3.7 g/dL (ref 1.9–3.7)
Glucose, Bld: 82 mg/dL (ref 65–99)
Potassium: 4.3 mmol/L (ref 3.5–5.3)
Sodium: 140 mmol/L (ref 135–146)
Total Bilirubin: 0.3 mg/dL (ref 0.2–1.2)
Total Protein: 8.2 g/dL — ABNORMAL HIGH (ref 6.1–8.1)
eGFR: 112 mL/min/{1.73_m2} (ref >=60)

## 2023-02-21 LAB — CBC WITH DIFFERENTIAL/PLATELET
Absolute Lymphocytes: 1038 {cells}/uL (ref 850–3900)
Absolute Monocytes: 342 {cells}/uL (ref 200–950)
Basophils Absolute: 9 {cells}/uL (ref 0–200)
Basophils Relative: 0.3 %
Eosinophils Absolute: 51 {cells}/uL (ref 15–500)
Eosinophils Relative: 1.7 %
HCT: 42.5 % (ref 35.0–45.0)
Hemoglobin: 13.8 g/dL (ref 11.7–15.5)
MCH: 30.7 pg (ref 27.0–33.0)
MCHC: 32.5 g/dL (ref 32.0–36.0)
MCV: 94.4 fL (ref 80.0–100.0)
MPV: 10.4 fL (ref 7.5–12.5)
Monocytes Relative: 11.4 %
Neutro Abs: 1560 {cells}/uL (ref 1500–7800)
Neutrophils Relative %: 52 %
Platelets: 263 10*3/uL (ref 140–400)
RBC: 4.5 10*6/uL (ref 3.80–5.10)
RDW: 12.5 % (ref 11.0–15.0)
Total Lymphocyte: 34.6 %
WBC: 3 10*3/uL — ABNORMAL LOW (ref 3.8–10.8)

## 2023-02-21 LAB — C3 AND C4
C3 Complement: 145 mg/dL (ref 83–193)
C4 Complement: 26 mg/dL (ref 15–57)

## 2023-02-21 LAB — PROTEIN / CREATININE RATIO, URINE
Creatinine, Urine: 131 mg/dL (ref 20–275)
Protein/Creat Ratio: 53 mg/g{creat} (ref 24–184)
Protein/Creatinine Ratio: 0.053 mg/mg{creat} (ref 0.024–0.184)
Total Protein, Urine: 7 mg/dL (ref 5–24)

## 2023-02-21 LAB — ANTI-DNA ANTIBODY, DOUBLE-STRANDED: ds DNA Ab: 1 [IU]/mL

## 2023-02-21 LAB — SEDIMENTATION RATE: Sed Rate: 9 mm/h (ref 0–20)

## 2023-02-23 NOTE — Progress Notes (Signed)
Total protein is borderline elevated. Rest of CMP WNL.  WBC count is low-3.0.  rest of CBC WNL.  We will continue to monitor.   Protein creatinine ratio WNL Complements WNL.  dsDNA is negative.  ESR WNL  Labs are not consistent with a flare.  No change in therapy recommended at this time.

## 2023-03-30 ENCOUNTER — Other Ambulatory Visit: Payer: Self-pay | Admitting: Rheumatology

## 2023-05-15 NOTE — Progress Notes (Unsigned)
 Office Visit Note  Patient: Brittany Preston             Date of Birth: 04/17/1995           MRN: [id number]             PCP: Verlon Au, MD Referring: Verlon Au, MD Visit Date: 05/29/2023 Occupation: @GUAROCC @  Subjective:  Medication monitoring  History of Present Illness: Brittany Preston is a 28 y.o. female with history of systemic lupus.  Patient remains on Methotrexate 4 tablets by mouth once weekly, folic acid 1 mg daily, and Plaquenil 200 mg 1 tablet by mouth BID M-F.  She is tolerating combination therapy without any side effects and has not had any recent gaps in therapy.  She denies any signs or symptoms of a systemic lupus flare.  She has not had any joint pain, joint swelling, joint stiffness during the winter months.  She denies any symptoms of Raynaud's phenomenon.  She has not any recent rashes.  She denies any oral nasal ulcerations.  She has not had any sicca symptoms.  Her energy level has been stable.  She denies any new medical conditions.  She denies any recent or recurrent infections.     Activities of Daily Living:  Patient reports morning stiffness for 10-15 minutes.   Patient Denies nocturnal pain.  Difficulty dressing/grooming: Denies Difficulty climbing stairs: Denies Difficulty getting out of chair: Denies Difficulty using hands for taps, buttons, cutlery, and/or writing: Denies  Review of Systems  Constitutional:  Negative for fatigue.  HENT:  Negative for mouth sores and mouth dryness.   Eyes:  Negative for dryness.  Respiratory:  Negative for shortness of breath.   Cardiovascular:  Negative for chest pain and palpitations.  Gastrointestinal:  Negative for blood in stool, constipation and diarrhea.  Endocrine: Negative for increased urination.  Genitourinary:  Negative for involuntary urination.  Musculoskeletal:  Positive for myalgias, morning stiffness and myalgias. Negative for joint pain, gait problem, joint pain, joint swelling,  muscle weakness and muscle tenderness.  Skin:  Negative for color change, rash, hair loss and sensitivity to sunlight.  Allergic/Immunologic: Negative for susceptible to infections.  Neurological:  Negative for dizziness and headaches.  Hematological:  Negative for swollen glands.  Psychiatric/Behavioral:  Negative for depressed mood and sleep disturbance. The patient is not nervous/anxious.     PMFS History:  Patient Active Problem List   Diagnosis Date Noted   Systemic lupus erythematosus (HCC) 11/10/2016   Vitamin D deficiency 11/06/2016   Hair loss 10/14/2016   Leukopenia 10/14/2016   High risk medication use 10/14/2016   Moderate persistent asthma without complication 12/10/2015    Past Medical History:  Diagnosis Date   Asthma    Lupus 06/2016   Systemic lupus erythematosus (HCC)     Family History  Problem Relation Age of Onset   Lupus Sister    Kidney disease Sister    Hypertension Sister    History reviewed. No pertinent surgical history. Social History   Social History Narrative   Not on file   Immunization History  Administered Date(s) Administered   DTaP 03/04/1996, 06/27/1996, 10/07/1996, 07/07/1997, 01/03/2000   HIB (PRP-OMP) 03/04/1996, 06/27/1996, 10/07/1996, 07/07/1997   HIB (PRP-T) 03/04/1996, 06/27/1996, 10/07/1996, 07/07/1997   HPV Quadrivalent 12/27/2008, 03/09/2009, 06/15/2009   Hepatitis A 06/18/2005, 10/20/2006   Hepatitis A, Ped/Adol-2 Dose 06/18/2005, 10/20/2006   Hepatitis B 01/13/1996, 02/24/1996, 10/07/1996   Hepatitis B, PED/ADOLESCENT 01/13/1996, 02/24/1996, 10/07/1996   IPV 03/04/1996,  06/27/1996, 10/07/1996, 01/03/2000   Influenza,inj,Quad PF,6+ Mos 12/10/2015   Influenza-Unspecified 12/10/2015, 01/07/2017   MMR 12/30/1996, 01/03/2000   Meningococcal Conjugate 12/27/2007, 03/29/2013   PFIZER(Purple Top)SARS-COV-2 Vaccination 06/10/2019, 07/01/2019, 01/23/2020   Pneumococcal Conjugate-13 01/03/2000   Pneumococcal Polysaccharide-23  08/04/2016   Tdap 10/20/2006   Varicella 12/30/1996, 06/18/2005     Objective: Vital Signs: BP 105/73 (BP Location: Left Arm, Patient Position: Sitting, Cuff Size: Normal)   Pulse 69   Resp 14   Ht 5\' 2"  (1.575 m)   Wt 157 lb (71.2 kg)   LMP 05/17/2023   BMI 28.72 kg/m    Physical Exam Vitals and nursing note reviewed.  Constitutional:      Appearance: She is well-developed.  HENT:     Head: Normocephalic and atraumatic.  Eyes:     Conjunctiva/sclera: Conjunctivae normal.  Cardiovascular:     Rate and Rhythm: Normal rate and regular rhythm.     Heart sounds: Normal heart sounds.  Pulmonary:     Effort: Pulmonary effort is normal.     Breath sounds: Normal breath sounds.  Abdominal:     General: Bowel sounds are normal.     Palpations: Abdomen is soft.  Musculoskeletal:     Cervical back: Normal range of motion.  Lymphadenopathy:     Cervical: No cervical adenopathy.  Skin:    General: Skin is warm and dry.     Capillary Refill: Capillary refill takes less than 2 seconds.  Neurological:     Mental Status: She is alert and oriented to person, place, and time.  Psychiatric:        Behavior: Behavior normal.      Musculoskeletal Exam: C-spine, thoracic spine, lumbar spine good range of motion.  Shoulder joints, elbow joints, wrist joints MCPs and PIPs and DIPs have good range of motion with no synovitis.  Complete fist formation bilaterally.  Hip joints have good range of motion with no groin pain.  Knee joints have good range of motion no warmth or effusion.  Ankle joints have good range of motion with no tenderness or joint swelling.  CDAI Exam: CDAI Score: -- Patient Global: --; Provider Global: -- Swollen: --; Tender: -- Joint Exam 05/29/2023   No joint exam has been documented for this visit   There is currently no information documented on the homunculus. Go to the Rheumatology activity and complete the homunculus joint exam.  Investigation: No additional  findings.  Imaging: No results found.  Recent Labs: Lab Results  Component Value Date   WBC 3.0 (L) 02/20/2023   HGB 13.8 02/20/2023   PLT 263 02/20/2023   NA 140 02/20/2023   K 4.3 02/20/2023   CL 106 02/20/2023   CO2 25 02/20/2023   GLUCOSE 82 02/20/2023   BUN 10 02/20/2023   CREATININE 0.75 02/20/2023   BILITOT 0.3 02/20/2023   ALKPHOS 34 11/13/2016   AST 18 02/20/2023   ALT 19 02/20/2023   PROT 8.2 (H) 02/20/2023   ALBUMIN 4.3 11/13/2016   CALCIUM 9.9 02/20/2023   GFRAA 121 07/12/2020    Speciality Comments: PLQ Eye Exam: 02/06/2023 WNL @ [facility] follow up in 1 year   Procedures:  No procedures performed Allergies: Patient has no known allergies.     Assessment / Plan:     Visit Diagnoses: Other systemic lupus erythematosus with other organ involvement (HCC) - fatigue, weight loss, hair loss, malar rash, Raynauds, skin lupus, neutropenia, elevated sedimentation rate, positive ANA,ds DNA, Sm, Ro, low C3:  She has not had any signs or symptoms of a systemic lupus flare.  She is clinically been doing well taking methotrexate 4 tablets by mouth once weekly, folic acid 1 mg daily, Plaquenil 200 mg 1 tablet by mouth twice daily Monday to Friday.  She is tolerating combination therapy without any side effects and has not had any recent gaps in therapy.  She has not been experiencing any increased joint stiffness and has no synovitis on examination today.  She has not had any recent rashes and has been avoiding direct sun exposure.  Discussed the importance of wearing sunscreen SPF 50 on a daily basis.  She has not had any symptoms of Raynaud's phenomenon.  Capillary refill is less than 2 seconds today.  She has not noticed any increased hair loss.  No oral or nasal ulcerations.  No sicca symptoms.  Her energy level has been stable. Discussed the increased risk for malignancy in patients with systemic lupus.  Discussed the importance of remaining up-to-date with age-related  cancer screenings.  She has an upcoming Pap smear/pelvic exam scheduled. Lab work from 02/20/23 was reviewed today in the office: ESR WNL, dsDNA negative, complements WNL, and no proteinuria.  The following lab work will be obtained today for further evaluation.  Patient will remain on Plaquenil and methotrexate as combination therapy.  She was  advised to notify us if she develops signs or symptoms of a flare.  She will follow-up in the office in 5 months or sooner if needed. - Plan: CBC with Differential/Platelet, COMPLETE METABOLIC PANEL WITH GFR, Anti-DNA antibody, double-stranded, C3 and C4, Sedimentation rate, hydroxychloroquine (PLAQUENIL) 200 MG tablet, Protein / creatinine ratio, urine, COMPLETE METABOLIC PANEL WITH GFR, CBC with Differential/Platelet, Anti-DNA antibody, double-stranded, C3 and C4, Sedimentation rate  High risk medication use - Methotrexate 4 tablets by mouth once weekly, folic acid 1 mg daily, and Plaquenil 200 mg 1 tablet by mouth BID M-F.  CBC and CMP updated on 02/20/23.  Orders for CBC and CMP were released today. PLQ Eye Exam: 02/06/2023 WNL @ [facility] follow up in 1 year  Discussed the importance of holding methotrexate if she develops signs or symptoms of an infection and to resume once the infection has completely cleared.  - Plan: CBC with Differential/Platelet, Protein / creatinine ratio, urine, COMPLETE METABOLIC PANEL WITH GFR, COMPLETE METABOLIC PANEL WITH GFR, CBC with Differential/Platelet  Raynaud's syndrome without gangrene: Not currently symptomatic.  No digital ulcerations or signs of gangrene.  Capillary refill less than 2 seconds.  No signs of sclerodactyly.  Knee joint stiffness, bilateral: Not currently symptomatic.  She has good range of motion of both knee joints on examination today.  No warmth or effusion noted.  Other decreased white blood cell (WBC) count: White blood cell count was 3.0--stable on 02/20/2023. CBC with diff updated today.    Other medical conditions are listed as follows:  History of asthma: No wheezing noted today.  Other insomnia: She has been sleeping well at night.  Anxiety  Vitamin D deficiency: Vitamin D WNL-56.4 on 05/27/23.   Family history of lupus erythematosus  Other constipation    Orders: Orders Placed This Encounter  Procedures   CBC with Differential/Platelet   Protein / creatinine ratio, urine   COMPLETE METABOLIC PANEL WITH GFR   Anti-DNA antibody, double-stranded   C3 and C4   Sedimentation rate   Protein / creatinine ratio, urine   COMPLETE METABOLIC PANEL WITH GFR   CBC with Differential/Platelet  Anti-DNA antibody, double-stranded   C3 and C4   Sedimentation rate   Meds ordered this encounter  Medications   methotrexate (RHEUMATREX) 2.5 MG tablet    Sig: Take 4 tablets (10 mg total) by mouth once a week. Caution:Chemotherapy. Protect from light.    Dispense:  48 tablet    Refill:  0   hydroxychloroquine (PLAQUENIL) 200 MG tablet    Sig: TAKE 1 TABLET BY MOUTH TWICE DAILY( EVERY TWELVE HOURS) MONDAY THROUGH FRIDAY. DO NOT TAKE ON SATURDAY OR SUNDAY.    Dispense:  120 tablet    Refill:  0    Follow-Up Instructions: Return in about 5 months (around 10/26/2023) for Systemic lupus erythematosus.   Gearldine Bienenstock, PA-C  Note - This record has been created using Dragon software.  Chart creation errors have been sought, but may not always  have been located. Such creation errors do not reflect on  the standard of medical care.

## 2023-05-29 ENCOUNTER — Ambulatory Visit: Payer: Managed Care, Other (non HMO) | Attending: Physician Assistant | Admitting: Physician Assistant

## 2023-05-29 ENCOUNTER — Encounter: Payer: Self-pay | Admitting: Physician Assistant

## 2023-05-29 VITALS — BP 105/73 | HR 69 | Resp 14 | Ht 62.0 in | Wt 157.0 lb

## 2023-05-29 DIAGNOSIS — E559 Vitamin D deficiency, unspecified: Secondary | ICD-10-CM

## 2023-05-29 DIAGNOSIS — I7300 Raynaud's syndrome without gangrene: Secondary | ICD-10-CM

## 2023-05-29 DIAGNOSIS — G4709 Other insomnia: Secondary | ICD-10-CM

## 2023-05-29 DIAGNOSIS — Z79899 Other long term (current) drug therapy: Secondary | ICD-10-CM

## 2023-05-29 DIAGNOSIS — M3219 Other organ or system involvement in systemic lupus erythematosus: Secondary | ICD-10-CM | POA: Diagnosis not present

## 2023-05-29 DIAGNOSIS — K5909 Other constipation: Secondary | ICD-10-CM

## 2023-05-29 DIAGNOSIS — F419 Anxiety disorder, unspecified: Secondary | ICD-10-CM

## 2023-05-29 DIAGNOSIS — M25662 Stiffness of left knee, not elsewhere classified: Secondary | ICD-10-CM

## 2023-05-29 DIAGNOSIS — Z840 Family history of diseases of the skin and subcutaneous tissue: Secondary | ICD-10-CM

## 2023-05-29 DIAGNOSIS — M25661 Stiffness of right knee, not elsewhere classified: Secondary | ICD-10-CM

## 2023-05-29 DIAGNOSIS — D72818 Other decreased white blood cell count: Secondary | ICD-10-CM

## 2023-05-29 DIAGNOSIS — Z8709 Personal history of other diseases of the respiratory system: Secondary | ICD-10-CM

## 2023-05-29 MED ORDER — METHOTREXATE SODIUM 2.5 MG PO TABS: 10.0000 mg | ORAL_TABLET | ORAL | 0 refills | Status: DC

## 2023-05-29 MED ORDER — HYDROXYCHLOROQUINE SULFATE 200 MG PO TABS
ORAL_TABLET | ORAL | 0 refills | Status: DC
Start: 2023-05-29 — End: 2023-08-04

## 2023-05-29 NOTE — Patient Instructions (Signed)
 Standing Labs We placed an order today for your standing lab work.   Please have your standing labs drawn at end of May/early June    Please have your labs drawn 2 weeks prior to your appointment so that the provider can discuss your lab results at your appointment, if possible.  Please note that you may see your imaging and lab results in MyChart before we have reviewed them. We will contact you once all results are reviewed. Please allow our office up to 72 hours to thoroughly review all of the results before contacting the office for clarification of your results.  WALK-IN LAB HOURS  Monday through Thursday from 8:00 am -12:30 pm and 1:00 pm-5:00 pm and Friday from 8:00 am-12:00 pm.  Patients with office visits requiring labs will be seen before walk-in labs.  You may encounter longer than normal wait times. Please allow additional time. Wait times may be shorter on  Monday and Thursday afternoons.  We do not book appointments for walk-in labs. We appreciate your patience and understanding with our staff.   Labs are drawn by [facility]. Please bring your co-pay at the time of your lab draw.  You may receive a bill from [facility] for your lab work.  Please note if you are on Hydroxychloroquine and and an order has been placed for a Hydroxychloroquine level,  you will need to have it drawn 4 hours or more after your last dose.  If you wish to have your labs drawn at another location, please call the office 24 hours in advance so we can fax the orders.  The office is located at [address], [place], [place] [address]   If you have any questions regarding directions or hours of operation,  please call [phone].   As a reminder, please drink plenty of water prior to coming for your lab work. Thanks!

## 2023-06-01 ENCOUNTER — Telehealth: Payer: Self-pay | Admitting: *Deleted

## 2023-06-01 DIAGNOSIS — Z79899 Other long term (current) drug therapy: Secondary | ICD-10-CM

## 2023-06-01 NOTE — Telephone Encounter (Signed)
-----   Message from Gearldine Bienenstock sent at 06/01/2023  7:55 AM EST ----- Urine protein creatinine ratio WNL  CMP WNL ESR WNL Complements WNL  WBC count is low-2.5. absolute neutrophils are low.  Rest of CBC Wnl.  Recheck CBC with diff in 1 month.

## 2023-06-01 NOTE — Progress Notes (Signed)
 Urine protein creatinine ratio WNL  CMP WNL ESR WNL Complements WNL  WBC count is low-2.5. absolute neutrophils are low.  Rest of CBC Wnl.  Recheck CBC with diff in 1 month.

## 2023-06-02 LAB — CBC WITH DIFFERENTIAL/PLATELET
Absolute Lymphocytes: 868 {cells}/uL (ref 850–3900)
Absolute Monocytes: 285 {cells}/uL (ref 200–950)
Basophils Absolute: 10 {cells}/uL (ref 0–200)
Basophils Relative: 0.4 %
Eosinophils Absolute: 20 {cells}/uL (ref 15–500)
Eosinophils Relative: 0.8 %
HCT: 39.4 % (ref 35.0–45.0)
Hemoglobin: 13 g/dL (ref 11.7–15.5)
MCH: 31 pg (ref 27.0–33.0)
MCHC: 33 g/dL (ref 32.0–36.0)
MCV: 93.8 fL (ref 80.0–100.0)
MPV: 9.9 fL (ref 7.5–12.5)
Monocytes Relative: 11.4 %
Neutro Abs: 1318 {cells}/uL — ABNORMAL LOW (ref 1500–7800)
Neutrophils Relative %: 52.7 %
Platelets: 268 10*3/uL (ref 140–400)
RBC: 4.2 10*6/uL (ref 3.80–5.10)
RDW: 12.5 % (ref 11.0–15.0)
Total Lymphocyte: 34.7 %
WBC: 2.5 10*3/uL — ABNORMAL LOW (ref 3.8–10.8)

## 2023-06-02 LAB — COMPLETE METABOLIC PANEL WITH GFR
AG Ratio: 1.3 (calc) (ref 1.0–2.5)
ALT: 16 U/L (ref 6–29)
AST: 16 U/L (ref 10–30)
Albumin: 4.4 g/dL (ref 3.6–5.1)
Alkaline phosphatase (APISO): 39 U/L (ref 31–125)
BUN: 10 mg/dL (ref 7–25)
CO2: 29 mmol/L (ref 20–32)
Calcium: 9.6 mg/dL (ref 8.6–10.2)
Chloride: 104 mmol/L (ref 98–110)
Creat: 0.73 mg/dL (ref 0.50–0.96)
Globulin: 3.3 g/dL (ref 1.9–3.7)
Glucose, Bld: 84 mg/dL (ref 65–99)
Potassium: 4.2 mmol/L (ref 3.5–5.3)
Sodium: 138 mmol/L (ref 135–146)
Total Bilirubin: 0.3 mg/dL (ref 0.2–1.2)
Total Protein: 7.7 g/dL (ref 6.1–8.1)
eGFR: 116 mL/min/{1.73_m2} (ref >=60)

## 2023-06-02 LAB — PROTEIN / CREATININE RATIO, URINE
Creatinine, Urine: 118 mg/dL (ref 20–275)
Protein/Creat Ratio: 59 mg/g{creat} (ref 24–184)
Protein/Creatinine Ratio: 0.059 mg/mg{creat} (ref 0.024–0.184)
Total Protein, Urine: 7 mg/dL (ref 5–24)

## 2023-06-02 LAB — C3 AND C4
C3 Complement: 138 mg/dL (ref 83–193)
C4 Complement: 25 mg/dL (ref 15–57)

## 2023-06-02 LAB — ANTI-DNA ANTIBODY, DOUBLE-STRANDED: ds DNA Ab: 1 [IU]/mL

## 2023-06-02 LAB — SEDIMENTATION RATE: Sed Rate: 11 mm/h (ref 0–20)

## 2023-06-02 NOTE — Progress Notes (Signed)
 dsDNA is negative

## 2023-07-15 ENCOUNTER — Other Ambulatory Visit: Payer: Self-pay | Admitting: *Deleted

## 2023-07-15 DIAGNOSIS — Z79899 Other long term (current) drug therapy: Secondary | ICD-10-CM

## 2023-07-15 LAB — CBC WITH DIFFERENTIAL/PLATELET
Absolute Lymphocytes: 1123 {cells}/uL (ref 850–3900)
Absolute Monocytes: 294 {cells}/uL (ref 200–950)
Basophils Absolute: 11 {cells}/uL (ref 0–200)
Basophils Relative: 0.4 %
Eosinophils Absolute: 19 {cells}/uL (ref 15–500)
Eosinophils Relative: 0.7 %
HCT: 38.5 % (ref 35.0–45.0)
Hemoglobin: 12.7 g/dL (ref 11.7–15.5)
MCH: 30.9 pg (ref 27.0–33.0)
MCHC: 33 g/dL (ref 32.0–36.0)
MCV: 93.7 fL (ref 80.0–100.0)
MPV: 10.1 fL (ref 7.5–12.5)
Monocytes Relative: 10.9 %
Neutro Abs: 1253 {cells}/uL — ABNORMAL LOW (ref 1500–7800)
Neutrophils Relative %: 46.4 %
Platelets: 252 10*3/uL (ref 140–400)
RBC: 4.11 10*6/uL (ref 3.80–5.10)
RDW: 12.9 % (ref 11.0–15.0)
Total Lymphocyte: 41.6 %
WBC: 2.7 10*3/uL — ABNORMAL LOW (ref 3.8–10.8)

## 2023-08-03 ENCOUNTER — Telehealth: Payer: Self-pay

## 2023-08-03 NOTE — Telephone Encounter (Signed)
 Patient contacted the office to inquire if we received her Methotrexate  refill request from her pharmacy. Advised the patient we did not receive a refill request and that I could work one up for her. Patient states to hold off because she is going to contact her pharmacy.

## 2023-08-04 ENCOUNTER — Other Ambulatory Visit: Payer: Self-pay | Admitting: *Deleted

## 2023-08-04 DIAGNOSIS — M3219 Other organ or system involvement in systemic lupus erythematosus: Secondary | ICD-10-CM

## 2023-08-04 MED ORDER — METHOTREXATE SODIUM 2.5 MG PO TABS
10.0000 mg | ORAL_TABLET | ORAL | 0 refills | Status: DC
Start: 2023-08-04 — End: 2023-11-04

## 2023-08-04 MED ORDER — HYDROXYCHLOROQUINE SULFATE 200 MG PO TABS: ORAL_TABLET | ORAL | 0 refills | Status: DC

## 2023-08-04 NOTE — Telephone Encounter (Signed)
 Patient contacted the office requesting a refill on MTX and PLQ to be sent to the [facility] in [place].   Last Fill: 05/29/2023  Eye exam: 02/06/2023 WNL   Labs: 05/29/2023 WBC count is low-2.5. absolute neutrophils are low.  Rest of CBC Wnl. CMP WNL 07/15/2023 WBC count is low but has improved-2.7.  absolute neutrophils are low but stable.  Rest of CBC WNL.   Next Visit: 10/23/2023  Last Visit: 05/29/2023  DX: Other systemic lupus erythematosus with other organ involvement   Current Dose per office note 05/29/2023: Plaquenil  200 mg 1 tablet by mouth BID M-F   Patient reminded she is due for labs this month.   Okay to refill Plaquenil  and MTX?

## 2023-08-18 NOTE — Telephone Encounter (Signed)
 I called the patient to discuss the symptoms she is currently spearing seeing.  Patient states that her last dose of methotrexate  was taken on Friday.  Patient states that through the weekend she was experiencing a sore throat which lasted for about 2 days.  She is now experiencing a runny nose, drainage, and congestion.  She has had a mild headache which she attributes to congestion and sneezing.  Patient states that she has taken Mucinex consistently for the past 2 days.  She was unsure if she was able to take DayQuil since she is prescribed methotrexate .  Patient was advised to hold methotrexate  this week until her symptoms have completely resolved.  Discussed that if her symptoms have not significantly improved by Thursday or Friday would recommend following up with PCP or urgent care for further evaluation. Also discussed the use of vitamin C and zinc. All questions were addressed.

## 2023-08-20 ENCOUNTER — Other Ambulatory Visit: Payer: Self-pay | Admitting: *Deleted

## 2023-08-20 DIAGNOSIS — M3219 Other organ or system involvement in systemic lupus erythematosus: Secondary | ICD-10-CM

## 2023-08-20 DIAGNOSIS — Z79899 Other long term (current) drug therapy: Secondary | ICD-10-CM

## 2023-08-21 ENCOUNTER — Ambulatory Visit: Payer: Self-pay | Admitting: Physician Assistant

## 2023-08-21 LAB — CBC WITH DIFFERENTIAL/PLATELET
Absolute Lymphocytes: 1364 {cells}/uL (ref 850–3900)
Absolute Monocytes: 512 {cells}/uL (ref 200–950)
Basophils Absolute: 20 {cells}/uL (ref 0–200)
Basophils Relative: 0.5 %
Eosinophils Absolute: 52 {cells}/uL (ref 15–500)
Eosinophils Relative: 1.3 %
HCT: 38.8 % (ref 35.0–45.0)
Hemoglobin: 12.8 g/dL (ref 11.7–15.5)
MCH: 31 pg (ref 27.0–33.0)
MCHC: 33 g/dL (ref 32.0–36.0)
MCV: 93.9 fL (ref 80.0–100.0)
MPV: 10 fL (ref 7.5–12.5)
Monocytes Relative: 12.8 %
Neutro Abs: 2052 {cells}/uL (ref 1500–7800)
Neutrophils Relative %: 51.3 %
Platelets: 248 10*3/uL (ref 140–400)
RBC: 4.13 10*6/uL (ref 3.80–5.10)
RDW: 12.6 % (ref 11.0–15.0)
Total Lymphocyte: 34.1 %
WBC: 4 10*3/uL (ref 3.8–10.8)

## 2023-08-21 LAB — COMPLETE METABOLIC PANEL WITHOUT GFR
AG Ratio: 1.3 (calc) (ref 1.0–2.5)
ALT: 12 U/L (ref 6–29)
AST: 13 U/L (ref 10–30)
Albumin: 4.3 g/dL (ref 3.6–5.1)
Alkaline phosphatase (APISO): 48 U/L (ref 31–125)
BUN: 10 mg/dL (ref 7–25)
CO2: 28 mmol/L (ref 20–32)
Calcium: 9.6 mg/dL (ref 8.6–10.2)
Chloride: 105 mmol/L (ref 98–110)
Creat: 0.69 mg/dL (ref 0.50–0.96)
Globulin: 3.3 g/dL (ref 1.9–3.7)
Glucose, Bld: 82 mg/dL (ref 65–99)
Potassium: 4.1 mmol/L (ref 3.5–5.3)
Sodium: 138 mmol/L (ref 135–146)
Total Bilirubin: 0.3 mg/dL (ref 0.2–1.2)
Total Protein: 7.6 g/dL (ref 6.1–8.1)

## 2023-08-21 LAB — C3 AND C4
C3 Complement: 124 mg/dL (ref 83–193)
C4 Complement: 23 mg/dL (ref 15–57)

## 2023-08-21 LAB — PROTEIN / CREATININE RATIO, URINE
Creatinine, Urine: 82 mg/dL (ref 20–275)
Protein/Creat Ratio: 85 mg/g{creat} (ref 24–184)
Protein/Creatinine Ratio: 0.085 mg/mg{creat} (ref 0.024–0.184)
Total Protein, Urine: 7 mg/dL (ref 5–24)

## 2023-08-21 LAB — ANTI-DNA ANTIBODY, DOUBLE-STRANDED: ds DNA Ab: 1 [IU]/mL

## 2023-08-21 LAB — SEDIMENTATION RATE: Sed Rate: 9 mm/h (ref 0–20)

## 2023-08-21 NOTE — Progress Notes (Signed)
 ESR WNL Complements WNL Urine protein creatinine ratio WNL CBC and CMP Wnl

## 2023-08-25 NOTE — Progress Notes (Signed)
dsDNA is negative.  Labs are not consistent with a flare.

## 2023-08-31 NOTE — Telephone Encounter (Signed)
 Lactaid should be safe to take if needed

## 2023-08-31 NOTE — Telephone Encounter (Signed)
 I called patient, patient verbalized understanding.

## 2023-10-09 NOTE — Progress Notes (Unsigned)
 Office Visit Note  Patient: Brittany Preston             Date of Birth: [birth year]           MRN: [id number]             PCP: Jolee Madelin Patch, MD Referring: Jolee Madelin Patch, MD Visit Date: 10/23/2023 Occupation: @GUAROCC @  Subjective:  Medication monitoring  History of Present Illness: Brittany Preston is a 28 y.o. female with history of systemic lupus.  Patient remains on  Methotrexate  4 tablets by mouth once weekly, folic acid  1 mg daily, and Plaquenil  200 mg 1 tablet by mouth BID M-F.  She is tolerating combination therapy without any side effects and has not had any recent gaps in therapy.  She denies any signs or symptoms of a systemic lupus flare.  She experiences morning stiffness lasting for 10 to 15 minutes daily but has not had any increased joint pain and denies any joint swelling.  Her energy level has been stable overall but she has had some increased difficulty sleeping at night.  Patient states that if she is overtired she has noticed an increased frequency of palpitations.  She denies any chest pain, pleuritic chest pain, or shortness of breath.  She denies any recent rashes.  She is been trying to avoid direct sun exposure.  She denies any oral or nasal ulcerations.  She denies any sicca symptoms.  She has not had any symptoms of Raynaud's phenomenon.  She denies any recent or recurrent infections.  She denies any new medical conditions.   Activities of Daily Living:  Patient reports morning stiffness for 10-15 minutes.   Patient Denies nocturnal pain.  Difficulty dressing/grooming: Denies Difficulty climbing stairs: Denies Difficulty getting out of chair: Denies Difficulty using hands for taps, buttons, cutlery, and/or writing: Denies  Review of Systems  Constitutional:  Negative for fatigue.  HENT:  Negative for mouth sores and mouth dryness.   Eyes:  Negative for dryness.  Respiratory:  Negative for shortness of breath.   Cardiovascular:  Positive for  palpitations. Negative for chest pain.  Gastrointestinal:  Positive for constipation. Negative for blood in stool and diarrhea.  Endocrine: Negative for increased urination.  Genitourinary:  Negative for involuntary urination.  Musculoskeletal:  Positive for myalgias, morning stiffness and myalgias. Negative for joint pain, gait problem, joint pain, joint swelling, muscle weakness and muscle tenderness.  Skin:  Positive for sensitivity to sunlight. Negative for color change, rash and hair loss.  Allergic/Immunologic: Negative for susceptible to infections.  Neurological:  Negative for dizziness and headaches.  Hematological:  Negative for swollen glands.  Psychiatric/Behavioral:  Negative for depressed mood and sleep disturbance. The patient is not nervous/anxious.     PMFS History:  Patient Active Problem List   Diagnosis Date Noted   Systemic lupus erythematosus (HCC) 11/10/2016   Vitamin D  deficiency 11/06/2016   Hair loss 10/14/2016   Leukopenia 10/14/2016   High risk medication use 10/14/2016   Moderate persistent asthma without complication 12/10/2015    Past Medical History:  Diagnosis Date   Asthma    Lupus 06/2016   Systemic lupus erythematosus (HCC)     Family History  Problem Relation Age of Onset   Lupus Sister    Kidney disease Sister    Hypertension Sister    History reviewed. No pertinent surgical history. Social History   Social History Narrative   Not on file   Immunization History  Administered Date(s) Administered  DTaP 03/04/1996, 06/27/1996, 10/07/1996, 07/07/1997, 01/03/2000   HIB (PRP-OMP) 03/04/1996, 06/27/1996, 10/07/1996, 07/07/1997   HIB (PRP-T) 03/04/1996, 06/27/1996, 10/07/1996, 07/07/1997   HPV Quadrivalent 12/27/2008, 03/09/2009, 06/15/2009   Hepatitis A 06/18/2005, 10/20/2006   Hepatitis A, Ped/Adol-2 Dose 06/18/2005, 10/20/2006   Hepatitis B 01/13/1996, 02/24/1996, 10/07/1996   Hepatitis B, PED/ADOLESCENT 01/13/1996, 02/24/1996,  10/07/1996   IPV 03/04/1996, 06/27/1996, 10/07/1996, 01/03/2000   Influenza,inj,Quad PF,6+ Mos 12/10/2015   Influenza-Unspecified 12/10/2015, 01/07/2017   MMR 12/30/1996, 01/03/2000   Meningococcal Conjugate 12/27/2007, 03/29/2013   PFIZER(Purple Top)SARS-COV-2 Vaccination 06/10/2019, 07/01/2019, 01/23/2020   Pneumococcal Conjugate-13 01/03/2000   Pneumococcal Polysaccharide-23 08/04/2016   Tdap 10/20/2006   Varicella 12/30/1996, 06/18/2005     Objective: Vital Signs: BP 104/71 (BP Location: Left Arm, Patient Position: Sitting, Cuff Size: Normal)   Pulse 87   Resp 14   Ht 5' 2 (1.575 m)   Wt 152 lb (68.9 kg)   LMP 09/30/2023   BMI 27.80 kg/m    Physical Exam Vitals and nursing note reviewed.  Constitutional:      Appearance: She is well-developed.  HENT:     Head: Normocephalic and atraumatic.  Eyes:     Conjunctiva/sclera: Conjunctivae normal.  Cardiovascular:     Rate and Rhythm: Normal rate and regular rhythm.     Heart sounds: Normal heart sounds.  Pulmonary:     Effort: Pulmonary effort is normal.     Breath sounds: Normal breath sounds.  Abdominal:     General: Bowel sounds are normal.     Palpations: Abdomen is soft.  Musculoskeletal:     Cervical back: Normal range of motion.  Lymphadenopathy:     Cervical: No cervical adenopathy.  Skin:    General: Skin is warm and dry.     Capillary Refill: Capillary refill takes less than 2 seconds.  Neurological:     Mental Status: She is alert and oriented to person, place, and time.  Psychiatric:        Behavior: Behavior normal.      Musculoskeletal Exam: C-spine, thoracic spine, lumbar spine good range of motion.  Shoulder joints, elbow joints, wrist joints, MCPs, PIPs, DIPs have good range of motion with no synovitis.  Complete fist formation bilaterally.  Hip joints have good range of motion with no groin pain.  Knee joints have good range of motion no warmth or effusion.  Ankle joints have good range of motion  with no tenderness or joint swelling.  CDAI Exam: CDAI Score: -- Patient Global: --; Provider Global: -- Swollen: --; Tender: -- Joint Exam 10/23/2023   No joint exam has been documented for this visit   There is currently no information documented on the homunculus. Go to the Rheumatology activity and complete the homunculus joint exam.  Investigation: No additional findings.  Imaging: No results found.  Recent Labs: Lab Results  Component Value Date   WBC 4.0 08/20/2023   HGB 12.8 08/20/2023   PLT 248 08/20/2023   NA 138 08/20/2023   K 4.1 08/20/2023   CL 105 08/20/2023   CO2 28 08/20/2023   GLUCOSE 82 08/20/2023   BUN 10 08/20/2023   CREATININE 0.69 08/20/2023   BILITOT 0.3 08/20/2023   ALKPHOS 34 11/13/2016   AST 13 08/20/2023   ALT 12 08/20/2023   PROT 7.6 08/20/2023   ALBUMIN 4.3 11/13/2016   CALCIUM 9.6 08/20/2023   GFRAA 121 07/12/2020    Speciality Comments: PLQ Eye Exam: 02/06/2023 WNL @ [facility] follow up in 1  year   Procedures:  No procedures performed Allergies: Patient has no known allergies.   Assessment / Plan:     Visit Diagnoses: Other systemic lupus erythematosus with other organ involvement (HCC) - fatigue, weight loss, hair loss, malar rash, Raynauds, skin lupus, neutropenia, elevated sedimentation rate, positive ANA,ds DNA, Sm, Ro, low C3: She has not any signs or symptoms of a systemic lupus flare.  She is clinically been doing well taking methotrexate  4 tablets by mouth once weekly, folic acid  1 mg daily, and Plaquenil  200 mg 1 tablet by mouth twice daily Monday through Friday.  She is tolerating combination therapy without any side effects and has not had any recent gaps in therapy.  She has not had any recent rashes or signs of alopecia.  She has been avoiding direct sun exposure as encouraged.  She has not had any oral or nasal ulcerations.  No sicca symptoms.  No pleuritic chest pain or shortness of breath.  She experiences occasional  palpitations which she feels are more frequent when she is overtired.  Discussed that if she continues to have an increased frequency of palpitations especially if her sleep cycle becomes more regulated she should wear a Holter monitor for further evaluation. She experiences some stiffness lasting for 10 to 15 minutes daily.  She has not had any synovitis on examination. Lab work from 08/20/2023 was reviewed today in the office: Urine protein creatinine ratio within normal limits, CMP within normal limits, CBC within normal limits, double-stranded a negative, complements within normal limits, ESR within normal limits.  Future orders for the following labs were placed today to be drawn in August. She will remain on the current treatment regimen.  She was advised to notify us  if she develops any signs or symptoms of a flare.  She will follow-up in the office in 5 months or sooner if needed.  - Plan: Protein / creatinine ratio, urine, CBC with Differential/Platelet, Anti-DNA antibody, double-stranded, C3 and C4, Sedimentation rate, Comprehensive metabolic panel with GFR  High risk medication use - Methotrexate  4 tablets by mouth once weekly, folic acid  1 mg daily, and Plaquenil  200 mg 1 tablet by mouth BID M-F.  CBC and CMP updated on 08/20/23.  She will return for updated lab work in August and every 3 months to monitor for drug toxicity.  PLQ Eye Exam: 02/06/2023 WNL @ [facility] follow up in 1 year  No recent or recurrent infections. Discussed the importance of holding methotrexate  if she develops signs or symptoms of an infection and to resume once the infection has completely cleared.  Patient remains abstinent.  Discussed that methotrexate  is considered which rhinogenic medication and is not recommended if planning pregnancy or becomes sexually active.  - Plan: CBC with Differential/Platelet, Comprehensive metabolic panel with GFR  Raynaud's syndrome without gangrene: Not currently symptomatic.  No  digital ulcerations or signs of gangrene.  No signs of sclerodactyly noted.  Capillary refill less than 2 seconds.  Knee joint stiffness, bilateral: She has not been experiencing any increased pain or swelling in her knee joints.  She experiences some stiffness worsening in the mornings lasting for about 10 to 15 minutes.  She has not been taking any over-the-counter products for symptomatic relief.  On examination she has good range of motion of both knee joints with no warmth or effusion.  Other decreased white blood cell (WBC) count: CBC with differential was within normal limits on 08/20/2023.  She will have updated lab work performed in  August and every 3 months.   Other medical conditions are listed as follows:  History of asthma  Other insomnia: She has noticed an increased difficulty sleeping at night which she attributes to being under increased stress.  She has been focusing on good sleep hygiene.  Anxiety  Vitamin D  deficiency  Family history of lupus erythematosus  Orders: Orders Placed This Encounter  Procedures   Protein / creatinine ratio, urine   CBC with Differential/Platelet   Anti-DNA antibody, double-stranded   C3 and C4   Sedimentation rate   Comprehensive metabolic panel with GFR   No orders of the defined types were placed in this encounter.   Follow-Up Instructions: Return in about 5 months (around 03/24/2024) for Systemic lupus erythematosus.   Waddell CHRISTELLA Craze, PA-C  Note - This record has been created using Dragon software.  Chart creation errors have been sought, but may not always  have been located. Such creation errors do not reflect on  the standard of medical care.

## 2023-10-23 ENCOUNTER — Ambulatory Visit: Payer: Managed Care, Other (non HMO) | Attending: Physician Assistant | Admitting: Physician Assistant

## 2023-10-23 ENCOUNTER — Encounter: Payer: Self-pay | Admitting: Physician Assistant

## 2023-10-23 VITALS — BP 104/71 | HR 87 | Resp 14 | Ht 62.0 in | Wt 152.0 lb

## 2023-10-23 DIAGNOSIS — M25662 Stiffness of left knee, not elsewhere classified: Secondary | ICD-10-CM

## 2023-10-23 DIAGNOSIS — E559 Vitamin D deficiency, unspecified: Secondary | ICD-10-CM

## 2023-10-23 DIAGNOSIS — Z79899 Other long term (current) drug therapy: Secondary | ICD-10-CM | POA: Diagnosis not present

## 2023-10-23 DIAGNOSIS — Z8709 Personal history of other diseases of the respiratory system: Secondary | ICD-10-CM

## 2023-10-23 DIAGNOSIS — F419 Anxiety disorder, unspecified: Secondary | ICD-10-CM

## 2023-10-23 DIAGNOSIS — Z840 Family history of diseases of the skin and subcutaneous tissue: Secondary | ICD-10-CM

## 2023-10-23 DIAGNOSIS — M3219 Other organ or system involvement in systemic lupus erythematosus: Secondary | ICD-10-CM

## 2023-10-23 DIAGNOSIS — I7300 Raynaud's syndrome without gangrene: Secondary | ICD-10-CM | POA: Diagnosis not present

## 2023-10-23 DIAGNOSIS — G4709 Other insomnia: Secondary | ICD-10-CM

## 2023-10-23 DIAGNOSIS — M25661 Stiffness of right knee, not elsewhere classified: Secondary | ICD-10-CM | POA: Diagnosis not present

## 2023-10-23 DIAGNOSIS — D72818 Other decreased white blood cell count: Secondary | ICD-10-CM

## 2023-10-23 NOTE — Patient Instructions (Signed)
 Standing Labs We placed an order today for your standing lab work.   Please have your standing labs drawn in mid-August and every 3 months   Please have your labs drawn 2 weeks prior to your appointment so that the provider can discuss your lab results at your appointment, if possible.  Please note that you may see your imaging and lab results in MyChart before we have reviewed them. We will contact you once all results are reviewed. Please allow our office up to 72 hours to thoroughly review all of the results before contacting the office for clarification of your results.  WALK-IN LAB HOURS  Monday through Thursday from 8:00 am -12:30 pm and 1:00 pm-4:30 pm and Friday from 8:00 am-12:00 pm.  Patients with office visits requiring labs will be seen before walk-in labs.  You may encounter longer than normal wait times. Please allow additional time. Wait times may be shorter on  Monday and Thursday afternoons.  We do not book appointments for walk-in labs. We appreciate your patience and understanding with our staff.   Labs are drawn by [facility]. Please bring your co-pay at the time of your lab draw.  You may receive a bill from [facility] for your lab work.  Please note if you are on Hydroxychloroquine  and and an order has been placed for a Hydroxychloroquine  level,  you will need to have it drawn 4 hours or more after your last dose.  If you wish to have your labs drawn at another location, please call the office 24 hours in advance so we can fax the orders.  The office is located at [address], [place], [place] [address]   If you have any questions regarding directions or hours of operation,  please call [phone].   As a reminder, please drink plenty of water prior to coming for your lab work. Thanks!

## 2023-10-26 ENCOUNTER — Other Ambulatory Visit: Payer: Self-pay | Admitting: Rheumatology

## 2023-10-26 DIAGNOSIS — M3219 Other organ or system involvement in systemic lupus erythematosus: Secondary | ICD-10-CM

## 2023-10-27 NOTE — Telephone Encounter (Signed)
 Last Fill: 08/04/2023  Eye exam: 02/06/2023 WNL   Labs: 08/20/2023 ESR WNL  Complements WNL  Urine protein creatinine ratio WNL  CBC and CMP WNL  Next Visit: 03/30/2024  Last Visit: 10/23/2023  DX: Other systemic lupus erythematosus with other organ involvement   Current Dose per office note 10/23/2023: Plaquenil  200 mg 1 tablet by mouth BID M-F   Okay to refill Plaquenil ?

## 2023-11-04 ENCOUNTER — Other Ambulatory Visit: Payer: Self-pay | Admitting: Rheumatology

## 2023-11-04 NOTE — Telephone Encounter (Signed)
 Last Fill: 08/04/2023  Labs: 08/20/2023 ESR WNL  Complements WNL  Urine protein creatinine ratio WNL  CBC and CMP Wnl   Next Visit: 03/30/2024  Last Visit: 10/23/2023  DX: Other systemic lupus erythematosus with other organ involvement   Current Dose per office note 10/23/2023: Methotrexate  4 tablets by mouth once weekly   Okay to refill Methotrexate ?

## 2023-11-10 ENCOUNTER — Other Ambulatory Visit: Payer: Self-pay | Admitting: Physician Assistant

## 2023-11-10 NOTE — Telephone Encounter (Signed)
 Last Fill: 10/24/2022  Next Visit: 03/30/2024  Last Visit: 10/23/2023  Dx: Other systemic lupus erythematosus with other organ involvement (HCC)   Current Dose per office note on 10/23/2023: folic acid  1 mg daily   Okay to refill Folic Acid ?

## 2023-11-19 ENCOUNTER — Other Ambulatory Visit: Payer: Self-pay | Admitting: *Deleted

## 2023-11-19 DIAGNOSIS — Z79899 Other long term (current) drug therapy: Secondary | ICD-10-CM

## 2023-11-19 DIAGNOSIS — M3219 Other organ or system involvement in systemic lupus erythematosus: Secondary | ICD-10-CM

## 2023-11-21 LAB — CBC WITH DIFFERENTIAL/PLATELET
Absolute Lymphocytes: 1130 {cells}/uL (ref 850–3900)
Absolute Monocytes: 421 {cells}/uL (ref 200–950)
Basophils Absolute: 11 {cells}/uL (ref 0–200)
Basophils Relative: 0.3 %
Eosinophils Absolute: 29 {cells}/uL (ref 15–500)
Eosinophils Relative: 0.8 %
HCT: 39.3 % (ref 35.0–45.0)
Hemoglobin: 12.7 g/dL (ref 11.7–15.5)
MCH: 31.1 pg (ref 27.0–33.0)
MCHC: 32.3 g/dL (ref 32.0–36.0)
MCV: 96.3 fL (ref 80.0–100.0)
MPV: 10.1 fL (ref 7.5–12.5)
Monocytes Relative: 11.7 %
Neutro Abs: 2009 {cells}/uL (ref 1500–7800)
Neutrophils Relative %: 55.8 %
Platelets: 244 Thousand/uL (ref 140–400)
RBC: 4.08 Million/uL (ref 3.80–5.10)
RDW: 13 % (ref 11.0–15.0)
Total Lymphocyte: 31.4 %
WBC: 3.6 Thousand/uL — ABNORMAL LOW (ref 3.8–10.8)

## 2023-11-21 LAB — COMPREHENSIVE METABOLIC PANEL WITH GFR
AG Ratio: 1.4 (calc) (ref 1.0–2.5)
ALT: 22 U/L (ref 6–29)
AST: 19 U/L (ref 10–30)
Albumin: 4.6 g/dL (ref 3.6–5.1)
Alkaline phosphatase (APISO): 42 U/L (ref 31–125)
BUN: 13 mg/dL (ref 7–25)
CO2: 26 mmol/L (ref 20–32)
Calcium: 9.7 mg/dL (ref 8.6–10.2)
Chloride: 105 mmol/L (ref 98–110)
Creat: 0.75 mg/dL (ref 0.50–0.96)
Globulin: 3.2 g/dL (ref 1.9–3.7)
Glucose, Bld: 82 mg/dL (ref 65–99)
Potassium: 4 mmol/L (ref 3.5–5.3)
Sodium: 137 mmol/L (ref 135–146)
Total Bilirubin: 0.3 mg/dL (ref 0.2–1.2)
Total Protein: 7.8 g/dL (ref 6.1–8.1)
eGFR: 112 mL/min/1.73m2 (ref >=60)

## 2023-11-21 LAB — PROTEIN / CREATININE RATIO, URINE
Creatinine, Urine: 117 mg/dL (ref 20–275)
Protein/Creat Ratio: 68 mg/g{creat} (ref 24–184)
Protein/Creatinine Ratio: 0.068 mg/mg{creat} (ref 0.024–0.184)
Total Protein, Urine: 8 mg/dL (ref 5–24)

## 2023-11-21 LAB — ANTI-DNA ANTIBODY, DOUBLE-STRANDED: ds DNA Ab: 1 [IU]/mL

## 2023-11-21 LAB — SEDIMENTATION RATE: Sed Rate: 2 mm/h (ref 0–20)

## 2023-11-21 LAB — C3 AND C4
C3 Complement: 134 mg/dL (ref 83–193)
C4 Complement: 21 mg/dL (ref 15–57)

## 2023-11-23 ENCOUNTER — Ambulatory Visit: Payer: Self-pay | Admitting: Physician Assistant

## 2023-11-23 NOTE — Progress Notes (Signed)
 WBC count is slightly low-3.6. rest of CBC WNL.  We will continue to monitor.   CMP WNL ESR WNL Complements WNL  Urine protein creatinine ratio WNL  dsDNA is negative.  Labs are not consistent with a lupus flare.  No change in therapy recommended at this time.

## 2024-01-22 ENCOUNTER — Other Ambulatory Visit: Payer: Self-pay | Admitting: Rheumatology

## 2024-01-22 DIAGNOSIS — M3219 Other organ or system involvement in systemic lupus erythematosus: Secondary | ICD-10-CM

## 2024-01-22 NOTE — Telephone Encounter (Signed)
 Last Fill: 10/27/2023  Eye exam: 02/06/2023 WNL   Labs: 11/19/2023 WBC count is slightly low-3.6. rest of CBC WNL.  We will continue to monitor.   CMP WNL ESR WNL Complements WNL  Urine protein creatinine ratio WNL  dsDNA is negative.  Labs are not consistent with a lupus flare.  No change in therapy recommended at this time.   Next Visit: 03/29/2024  Last Visit: 10/23/2023  DX: Other systemic lupus erythematosus with other organ involvement    Current Dose per office note 10/23/2023:  Plaquenil  200 mg 1 tablet by mouth BID M-F.   Okay to refill Plaquenil ?

## 2024-01-24 ENCOUNTER — Other Ambulatory Visit: Payer: Self-pay | Admitting: Physician Assistant

## 2024-01-25 NOTE — Telephone Encounter (Signed)
 Last Fill: 11/04/2023  Labs: 11/19/2023 WBC count is slightly low-3.6. rest of CBC WNL.  We will continue to monitor.   CMP WNL ESR WNL Complements WNL  Urine protein creatinine ratio WNL  dsDNA is negative.  Labs are not consistent with a lupus flare.  No change in therapy recommended at this time  Next Visit: 03/29/2024  Last Visit: 10/23/2023  DX: Other systemic lupus erythematosus with other organ involvement   Current Dose per office note on 10/23/2023: Methotrexate  4 tablets by mouth once weekly   Okay to refill Methotrexate ?

## 2024-02-08 DIAGNOSIS — R21 Rash and other nonspecific skin eruption: Secondary | ICD-10-CM

## 2024-02-09 NOTE — Telephone Encounter (Signed)
 Reviewed photo with Dr. Linton is unclear the etiology of the rash--may be post-inflammatory hyperpigmentation.  Does the patient have a dermatologist that she has seen in the past? Recommend evaluation by derm.   We could try sending in a steroid cream but if it is related to yeast it will worsen.

## 2024-03-15 NOTE — Progress Notes (Unsigned)
 "  Office Visit Note  Patient: Brittany Preston             Date of Birth: 07-03-1995           MRN: [id number]             PCP: Jolee Madelin Patch, MD Referring: Jolee Madelin Patch, MD Visit Date: 03/29/2024 Occupation: Data Unavailable  Subjective:  Fatigue   History of Present Illness: Brittany Preston is a 28 y.o. female with history of systemic lupus.  Patient remains on  Methotrexate  4 tablets by mouth once weekly, folic acid  1 mg daily, and Plaquenil  200 mg 1 tablet by mouth BID M-F. She is tolerating combination therapy without any side effects.  She has not had any gaps in therapy.  Patient presents today with increased fatigue and daytime drowsiness for the past 2 months.  She has a new job and has been more physically active but denies any identifiable trigger.  She states the fatigue is most prominent mid-afternoon especially after lunch.  She denies any other signs or symptoms of a flare.      Activities of Daily Living:  Patient reports morning stiffness for 10-15 minutes.   Patient Denies nocturnal pain.  Difficulty dressing/grooming: Denies Difficulty climbing stairs: Denies Difficulty getting out of chair: Denies Difficulty using hands for taps, buttons, cutlery, and/or writing: Denies  Review of Systems  Constitutional:  Positive for fatigue.  HENT:  Negative for mouth sores and mouth dryness.   Eyes:  Negative for dryness.  Respiratory:  Negative for shortness of breath.   Cardiovascular:  Positive for palpitations. Negative for chest pain.  Gastrointestinal:  Positive for constipation. Negative for blood in stool and diarrhea.  Endocrine: Negative for increased urination.  Genitourinary:  Negative for involuntary urination.  Musculoskeletal:  Positive for myalgias, morning stiffness and myalgias. Negative for joint pain, gait problem, joint pain, joint swelling, muscle weakness and muscle tenderness.  Skin:  Positive for sensitivity to sunlight. Negative for color  change, rash and hair loss.  Allergic/Immunologic: Negative for susceptible to infections.  Neurological:  Positive for dizziness. Negative for headaches.  Hematological:  Negative for swollen glands.  Psychiatric/Behavioral:  Negative for depressed mood and sleep disturbance. The patient is not nervous/anxious.     PMFS History:  Patient Active Problem List   Diagnosis Date Noted   Systemic lupus erythematosus (HCC) 11/10/2016   Vitamin D  deficiency 11/06/2016   Hair loss 10/14/2016   Leukopenia 10/14/2016   High risk medication use 10/14/2016   Moderate persistent asthma without complication 12/10/2015    Past Medical History:  Diagnosis Date   Asthma    Lupus 06/2016   Systemic lupus erythematosus (HCC)     Family History  Problem Relation Age of Onset   Lupus Sister    Kidney disease Sister    Hypertension Sister    Cancer Maternal Grandfather        Prostate   History reviewed. No pertinent surgical history. Social History[1] Social History   Social History Narrative   Not on file     Immunization History  Administered Date(s) Administered   DTaP 03/04/1996, 06/27/1996, 10/07/1996, 07/07/1997, 01/03/2000   HIB (PRP-OMP) 03/04/1996, 06/27/1996, 10/07/1996, 07/07/1997   HIB (PRP-T) 03/04/1996, 06/27/1996, 10/07/1996, 07/07/1997   HPV Quadrivalent 12/27/2008, 03/09/2009, 06/15/2009   Hepatitis A 06/18/2005, 10/20/2006   Hepatitis A, Ped/Adol-2 Dose 06/18/2005, 10/20/2006   Hepatitis B 01/13/1996, 02/24/1996, 10/07/1996   Hepatitis B, PED/ADOLESCENT 01/13/1996, 02/24/1996, 10/07/1996   IPV  03/04/1996, 06/27/1996, 10/07/1996, 01/03/2000   Influenza,inj,Quad PF,6+ Mos 12/10/2015   Influenza-Unspecified 12/10/2015, 01/07/2017   MMR 12/30/1996, 01/03/2000   Meningococcal Conjugate 12/27/2007, 03/29/2013   PFIZER(Purple Top)SARS-COV-2 Vaccination 06/10/2019, 07/01/2019, 01/23/2020   Pneumococcal Conjugate-13 01/03/2000   Pneumococcal Polysaccharide-23 08/04/2016    Tdap 10/20/2006   Varicella 12/30/1996, 06/18/2005     Objective: Vital Signs: BP 107/73   Pulse 83   Temp 97.8 F (36.6 C)   Resp 16   Ht 5' 2 (1.575 m)   Wt 156 lb 6.4 oz (70.9 kg)   LMP 03/08/2024   BMI 28.61 kg/m    Physical Exam Vitals and nursing note reviewed.  Constitutional:      Appearance: She is well-developed.  HENT:     Head: Normocephalic and atraumatic.  Eyes:     Conjunctiva/sclera: Conjunctivae normal.  Cardiovascular:     Rate and Rhythm: Normal rate and regular rhythm.     Heart sounds: Normal heart sounds.  Pulmonary:     Effort: Pulmonary effort is normal.     Breath sounds: Normal breath sounds.  Abdominal:     General: Bowel sounds are normal.     Palpations: Abdomen is soft.  Musculoskeletal:     Cervical back: Normal range of motion.  Lymphadenopathy:     Cervical: No cervical adenopathy.  Skin:    General: Skin is warm and dry.     Capillary Refill: Capillary refill takes less than 2 seconds.  Neurological:     Mental Status: She is alert and oriented to person, place, and time.  Psychiatric:        Behavior: Behavior normal.      Musculoskeletal Exam: C-spine, thoracic spine, and lumbar spine have good ROM.  Shoulder joints, elbow joints, wrist joints, MCPs, PIPs, and DIPs good ROM with no synovitis.  Complete fist formation bilaterally.  Hip joints have good ROM with no groin pain.  Knee joints have good ROM with no warmth or effusion.  Ankle joints have good ROM with no tenderness or joint swelling.   CDAI Exam: CDAI Score: -- Patient Global: --; Provider Global: -- Swollen: --; Tender: -- Joint Exam 03/29/2024   No joint exam has been documented for this visit   There is currently no information documented on the homunculus. Go to the Rheumatology activity and complete the homunculus joint exam.  Investigation: No additional findings.  Imaging: No results found.  Recent Labs: Lab Results  Component Value Date   WBC  3.6 (L) 11/19/2023   HGB 12.7 11/19/2023   PLT 244 11/19/2023   NA 137 11/19/2023   K 4.0 11/19/2023   CL 105 11/19/2023   CO2 26 11/19/2023   GLUCOSE 82 11/19/2023   BUN 13 11/19/2023   CREATININE 0.75 11/19/2023   BILITOT 0.3 11/19/2023   ALKPHOS 34 11/13/2016   AST 19 11/19/2023   ALT 22 11/19/2023   PROT 7.8 11/19/2023   ALBUMIN 4.3 11/13/2016   CALCIUM 9.7 11/19/2023   GFRAA 121 07/12/2020    Speciality Comments: PLQ Eye Exam: 02/06/2023 WNL @ [facility] follow up in 1 year   Procedures:  No procedures performed Allergies: Patient has no known allergies.   Assessment / Plan:     Visit Diagnoses: Other systemic lupus erythematosus with other organ involvement (HCC) - fatigue, weight loss, hair loss, malar rash, Raynauds, skin lupus, neutropenia, elevated sedimentation rate, positive ANA,ds DNA, Sm, Ro, low C3:  Patient presents today with increased fatigue x2 months-no identifiable trigger.  No cervical lymphadenopathy.  No fevers or recent infections.  No change in appetite or unintentional weight loss.  No synovitis.  No malar rash.  No symptoms of raynaud's phenomenon.  No oral or nasal ulcers.   She remains on plaquenil  200 mg 1 tablet by mouth twice daily Monday through Friday and methotrexate  4 tablets by mouth once weekly. No recent gaps in therapy.  No medication changes will be made at this time.  Plan to obtain the following lab work today for further evaluation.   She will notify us  if she develops any new or worsening symptoms.  She will follow up in 5 months or sooner if needed.  - Plan: Protein / creatinine ratio, urine, CBC with Differential/Platelet, C3 and C4, Sedimentation rate, Comprehensive metabolic panel with GFR, Anti-DNA antibody, double-stranded, ANA  High risk medication use - Methotrexate  4 tablets by mouth once weekly, folic acid  1 mg daily, and Plaquenil  200 mg 1 tablet by mouth BID M-F.  CBC and CMP updated on 11/19/23.  Orders for CBC and CMP  released today.   PLQ Eye Exam: 02/06/2023 WNL @ [facility] follow up in 1 year. Plan to call for updated eye exam.  No recurrent infections. Discussed the importance of holding methotrexate  if she develops signs or symptoms of an infection and to resume once the infection has completely cleared.  - Plan: CBC with Differential/Platelet, Comprehensive metabolic panel with GFR  Raynaud's syndrome without gangrene: Not currently symptomatic.  No digital ulcers.    Knee joint stiffness, bilateral: Not currently symptomatic.  No warmth or effusion.    Other decreased white blood cell (WBC) count: WBC count is low-3.6, rest of CBC WNL.  Plan to recheck CBC with diff today.   Other fatigue -Patient presents today with increased fatigue x2 months.  She has been experiencing increased daytime drowsiness and fatigue mid-afternoon.  She has a new job that has required her to be more physically active.  No recent infections.  No other symptoms of a lupus flare.  No identifiable trigger.  She has been sleeping well overall.    Plan to obtain the following lab work today for further evaluation.    Plan: Iron, TIBC and Ferritin Panel, VITAMIN D  25 Hydroxy (Vit-D Deficiency, Fractures), Vitamin B12  Vitamin B12 deficiency - Vitamin B12 will be checked today.  Plan: Vitamin B12  Other medical conditions are listed as follows:   History of asthma  Other insomnia  Anxiety  Vitamin D  deficiency  Family history of lupus erythematosus - Plan: VITAMIN D  25 Hydroxy (Vit-D Deficiency, Fractures)   Orders: Orders Placed This Encounter  Procedures   Protein / creatinine ratio, urine   CBC with Differential/Platelet   C3 and C4   Sedimentation rate   Comprehensive metabolic panel with GFR   Anti-DNA antibody, double-stranded   ANA   Iron, TIBC and Ferritin Panel   VITAMIN D  25 Hydroxy (Vit-D Deficiency, Fractures)   Vitamin B12   No orders of the defined types were placed in this  encounter.    Follow-Up Instructions: No follow-ups on file.   Waddell CHRISTELLA Craze, PA-C  Note - This record has been created using Dragon software.  Chart creation errors have been sought, but may not always  have been located. Such creation errors do not reflect on  the standard of medical care.     [1]  Social History Tobacco Use   Smoking status: Never    Passive exposure: Current  Smokeless tobacco: Never  Vaping Use   Vaping status: Never Used  Substance Use Topics   Alcohol use: Not Currently   Drug use: Never   "

## 2024-03-29 ENCOUNTER — Encounter: Payer: Self-pay | Admitting: Physician Assistant

## 2024-03-29 ENCOUNTER — Ambulatory Visit: Attending: Physician Assistant | Admitting: Physician Assistant

## 2024-03-29 VITALS — BP 107/73 | HR 83 | Temp 97.8°F | Resp 16 | Ht 62.0 in | Wt 156.4 lb

## 2024-03-29 DIAGNOSIS — I7300 Raynaud's syndrome without gangrene: Secondary | ICD-10-CM | POA: Diagnosis not present

## 2024-03-29 DIAGNOSIS — M25661 Stiffness of right knee, not elsewhere classified: Secondary | ICD-10-CM | POA: Diagnosis not present

## 2024-03-29 DIAGNOSIS — Z79899 Other long term (current) drug therapy: Secondary | ICD-10-CM | POA: Diagnosis not present

## 2024-03-29 DIAGNOSIS — M3219 Other organ or system involvement in systemic lupus erythematosus: Secondary | ICD-10-CM

## 2024-03-29 DIAGNOSIS — R5383 Other fatigue: Secondary | ICD-10-CM

## 2024-03-29 DIAGNOSIS — Z840 Family history of diseases of the skin and subcutaneous tissue: Secondary | ICD-10-CM | POA: Diagnosis not present

## 2024-03-29 DIAGNOSIS — G4709 Other insomnia: Secondary | ICD-10-CM | POA: Diagnosis not present

## 2024-03-29 DIAGNOSIS — F419 Anxiety disorder, unspecified: Secondary | ICD-10-CM | POA: Diagnosis not present

## 2024-03-29 DIAGNOSIS — M25662 Stiffness of left knee, not elsewhere classified: Secondary | ICD-10-CM

## 2024-03-29 DIAGNOSIS — D72818 Other decreased white blood cell count: Secondary | ICD-10-CM | POA: Diagnosis not present

## 2024-03-29 DIAGNOSIS — E538 Deficiency of other specified B group vitamins: Secondary | ICD-10-CM

## 2024-03-29 DIAGNOSIS — E559 Vitamin D deficiency, unspecified: Secondary | ICD-10-CM | POA: Diagnosis not present

## 2024-03-29 DIAGNOSIS — Z8709 Personal history of other diseases of the respiratory system: Secondary | ICD-10-CM

## 2024-03-30 ENCOUNTER — Ambulatory Visit: Admitting: Physician Assistant

## 2024-03-31 ENCOUNTER — Ambulatory Visit: Payer: Self-pay | Admitting: Physician Assistant

## 2024-03-31 NOTE — Progress Notes (Signed)
 Urine protein creatinine ratio WNL ESR WNL CMP WNL WBC count remains low but is improving. Rest of CBC WNL.   Iron WNL Complements WNL Vitamin D  WNL Vitamin B12 WNL

## 2024-04-01 LAB — CBC WITH DIFFERENTIAL/PLATELET
Absolute Lymphocytes: 1136 {cells}/uL (ref 850–3900)
Absolute Monocytes: 411 {cells}/uL (ref 200–950)
Basophils Absolute: 19 {cells}/uL (ref 0–200)
Basophils Relative: 0.5 %
Eosinophils Absolute: 19 {cells}/uL (ref 15–500)
Eosinophils Relative: 0.5 %
HCT: 40.6 % (ref 35.9–46.0)
Hemoglobin: 13.3 g/dL (ref 11.7–15.5)
MCH: 30.6 pg (ref 27.0–33.0)
MCHC: 32.8 g/dL (ref 31.6–35.4)
MCV: 93.5 fL (ref 81.4–101.7)
MPV: 10 fL (ref 7.5–12.5)
Monocytes Relative: 11.1 %
Neutro Abs: 2116 {cells}/uL (ref 1500–7800)
Neutrophils Relative %: 57.2 %
Platelets: 258 Thousand/uL (ref 140–400)
RBC: 4.34 Million/uL (ref 3.80–5.10)
RDW: 12.2 % (ref 11.0–15.0)
Total Lymphocyte: 30.7 %
WBC: 3.7 Thousand/uL — ABNORMAL LOW (ref 3.8–10.8)

## 2024-04-01 LAB — COMPREHENSIVE METABOLIC PANEL WITH GFR
AG Ratio: 1.3 (calc) (ref 1.0–2.5)
ALT: 17 U/L (ref 6–29)
AST: 16 U/L (ref 10–30)
Albumin: 4.3 g/dL (ref 3.6–5.1)
Alkaline phosphatase (APISO): 39 U/L (ref 31–125)
BUN: 10 mg/dL (ref 7–25)
CO2: 28 mmol/L (ref 20–32)
Calcium: 9.6 mg/dL (ref 8.6–10.2)
Chloride: 106 mmol/L (ref 98–110)
Creat: 0.77 mg/dL (ref 0.50–0.96)
Globulin: 3.2 g/dL (ref 1.9–3.7)
Glucose, Bld: 84 mg/dL (ref 65–99)
Potassium: 4.2 mmol/L (ref 3.5–5.3)
Sodium: 139 mmol/L (ref 135–146)
Total Bilirubin: 0.5 mg/dL (ref 0.2–1.2)
Total Protein: 7.5 g/dL (ref 6.1–8.1)
eGFR: 108 mL/min/1.73m2

## 2024-04-01 LAB — PROTEIN / CREATININE RATIO, URINE
Creatinine, Urine: 285 mg/dL — ABNORMAL HIGH (ref 20–275)
Protein/Creat Ratio: 60 mg/g{creat} (ref 24–184)
Protein/Creatinine Ratio: 0.06 mg/mg{creat} (ref 0.024–0.184)
Total Protein, Urine: 17 mg/dL (ref 5–24)

## 2024-04-01 LAB — IRON,TIBC AND FERRITIN PANEL
%SAT: 48 % — ABNORMAL HIGH (ref 16–45)
Ferritin: 54 ng/mL (ref 16–154)
Iron: 129 ug/dL (ref 40–190)
TIBC: 271 ug/dL (ref 250–450)

## 2024-04-01 LAB — ANTI-NUCLEAR AB-TITER (ANA TITER): ANA Titer 1: 1:320 {titer} — ABNORMAL HIGH

## 2024-04-01 LAB — C3 AND C4
C3 Complement: 109 mg/dL (ref 83–193)
C4 Complement: 19 mg/dL (ref 15–57)

## 2024-04-01 LAB — ANA: Anti Nuclear Antibody (ANA): POSITIVE — AB

## 2024-04-01 LAB — ANTI-DNA ANTIBODY, DOUBLE-STRANDED: ds DNA Ab: 2 [IU]/mL

## 2024-04-01 LAB — SEDIMENTATION RATE: Sed Rate: 6 mm/h (ref 0–20)

## 2024-04-01 LAB — VITAMIN D 25 HYDROXY (VIT D DEFICIENCY, FRACTURES): Vit D, 25-Hydroxy: 68 ng/mL (ref 30–100)

## 2024-04-01 LAB — VITAMIN B12: Vitamin B-12: 591 pg/mL (ref 200–1100)

## 2024-04-01 NOTE — Progress Notes (Signed)
 ANA remains positive.

## 2024-04-03 NOTE — Progress Notes (Signed)
 dsDNA is negative-good news!  Labs are not consistent with a flare.

## 2024-04-14 LAB — OPHTHALMOLOGY REPORT-SCANNED: A Comment: NORMAL

## 2024-04-16 ENCOUNTER — Other Ambulatory Visit: Payer: Self-pay | Admitting: Physician Assistant

## 2024-04-18 NOTE — Telephone Encounter (Signed)
 Last Fill: 01/25/2024  Labs: 03/29/2024 Urine protein creatinine ratio WNL ESR WNL CMP WNL WBC count remains low but is improving. Rest of CBC WNL.   Iron WNL Complements WNL Vitamin D  WNL Vitamin B12 WNL ANA remains positive  dsDNA is negative-good news!  Labs are not consistent with a flare.   Next Visit: 08/24/2024  Last Visit: 03/29/2024  DX:  Other systemic lupus erythematosus with other organ involvement (HCC)   Current Dose per office note 03/29/2024: Methotrexate  4 tablets by mouth once weekly   Okay to refill Methotrexate ?

## 2024-04-19 ENCOUNTER — Other Ambulatory Visit: Payer: Self-pay | Admitting: Rheumatology

## 2024-04-19 DIAGNOSIS — M3219 Other organ or system involvement in systemic lupus erythematosus: Secondary | ICD-10-CM

## 2024-04-19 NOTE — Telephone Encounter (Signed)
 Last Fill: 01/22/2024  Eye exam: 04/14/2024   Labs: 03/29/2024 Urine protein creatinine ratio WNL ESR WNL CMP WNL WBC count remains low but is improving. Rest of CBC WNL.   Iron WNL Complements WNL Vitamin D  WNL Vitamin B12 WNL  Next Visit: 08/24/2024  Last Visit: 03/29/2024  IK:Nuyzm systemic lupus erythematosus with other organ involvement   Current Dose per office note on 03/29/2024: Plaquenil  200 mg 1 tablet by mouth BID M-F.   Okay to refill Plaquenil ?

## 2024-08-24 ENCOUNTER — Ambulatory Visit: Admitting: Physician Assistant

## 2024-09-20 ENCOUNTER — Ambulatory Visit: Admitting: Physician Assistant
